# Patient Record
Sex: Male | Born: 1955
Health system: Southern US, Community
[De-identification: ages and names within clinical notes are randomized; demographics above are authoritative.]

## PROBLEM LIST (undated history)

## (undated) DIAGNOSIS — K219 Gastro-esophageal reflux disease without esophagitis: Secondary | ICD-10-CM

## (undated) DIAGNOSIS — B1920 Unspecified viral hepatitis C without hepatic coma: Secondary | ICD-10-CM

## (undated) DIAGNOSIS — Z951 Presence of aortocoronary bypass graft: Secondary | ICD-10-CM

## (undated) DIAGNOSIS — Z9889 Other specified postprocedural states: Secondary | ICD-10-CM

## (undated) DIAGNOSIS — IMO0002 Reserved for concepts with insufficient information to code with codable children: Secondary | ICD-10-CM

## (undated) DIAGNOSIS — I9789 Other postprocedural complications and disorders of the circulatory system, not elsewhere classified: Secondary | ICD-10-CM

## (undated) DIAGNOSIS — I2510 Atherosclerotic heart disease of native coronary artery without angina pectoris: Secondary | ICD-10-CM

## (undated) DIAGNOSIS — F1010 Alcohol abuse, uncomplicated: Secondary | ICD-10-CM

## (undated) DIAGNOSIS — T8859XA Other complications of anesthesia, initial encounter: Secondary | ICD-10-CM

## (undated) DIAGNOSIS — I4891 Unspecified atrial fibrillation: Secondary | ICD-10-CM

## (undated) DIAGNOSIS — E785 Hyperlipidemia, unspecified: Secondary | ICD-10-CM

## (undated) DIAGNOSIS — R740 Nonspecific elevation of levels of transaminase and lactic acid dehydrogenase [LDH]: Secondary | ICD-10-CM

## (undated) DIAGNOSIS — B182 Chronic viral hepatitis C: Secondary | ICD-10-CM

## (undated) DIAGNOSIS — J45909 Unspecified asthma, uncomplicated: Secondary | ICD-10-CM

## (undated) DIAGNOSIS — Z8489 Family history of other specified conditions: Secondary | ICD-10-CM

## (undated) DIAGNOSIS — R112 Nausea with vomiting, unspecified: Secondary | ICD-10-CM

## (undated) DIAGNOSIS — T4145XA Adverse effect of unspecified anesthetic, initial encounter: Secondary | ICD-10-CM

## (undated) DIAGNOSIS — I499 Cardiac arrhythmia, unspecified: Secondary | ICD-10-CM

## (undated) DIAGNOSIS — I10 Essential (primary) hypertension: Secondary | ICD-10-CM

## (undated) DIAGNOSIS — M1990 Unspecified osteoarthritis, unspecified site: Secondary | ICD-10-CM

## (undated) HISTORY — PX: OTHER SURGICAL HISTORY: SHX169

## (undated) HISTORY — DX: Alcohol abuse, uncomplicated: F10.10

## (undated) HISTORY — DX: Atherosclerotic heart disease of native coronary artery without angina pectoris: I25.10

## (undated) HISTORY — DX: Hyperlipidemia, unspecified: E78.5

## (undated) HISTORY — DX: Nonspecific elevation of levels of transaminase and lactic acid dehydrogenase (ldh): R74.0

## (undated) HISTORY — DX: Essential (primary) hypertension: I10

## (undated) HISTORY — DX: Chronic viral hepatitis C: B18.2

## (undated) HISTORY — DX: Unspecified atrial fibrillation: I48.91

## (undated) HISTORY — DX: Unspecified viral hepatitis C without hepatic coma: B19.20

## (undated) HISTORY — DX: Presence of aortocoronary bypass graft: Z95.1

## (undated) HISTORY — DX: Other postprocedural complications and disorders of the circulatory system, not elsewhere classified: I97.89

## (undated) HISTORY — DX: Reserved for concepts with insufficient information to code with codable children: IMO0002

## (undated) HISTORY — PX: ORCHIECTOMY: SHX2116

---

## 1999-03-30 ENCOUNTER — Ambulatory Visit (HOSPITAL_BASED_OUTPATIENT_CLINIC_OR_DEPARTMENT_OTHER): Admission: RE | Admit: 1999-03-30 | Discharge: 1999-03-30 | Payer: Self-pay | Admitting: Otolaryngology

## 1999-09-21 ENCOUNTER — Encounter: Payer: Self-pay | Admitting: *Deleted

## 1999-09-21 ENCOUNTER — Encounter: Admission: RE | Admit: 1999-09-21 | Discharge: 1999-09-21 | Payer: Self-pay | Admitting: *Deleted

## 2002-09-03 ENCOUNTER — Encounter: Payer: Self-pay | Admitting: Gastroenterology

## 2002-09-03 ENCOUNTER — Ambulatory Visit (HOSPITAL_COMMUNITY): Admission: RE | Admit: 2002-09-03 | Discharge: 2002-09-03 | Payer: Self-pay | Admitting: Gastroenterology

## 2002-11-07 ENCOUNTER — Emergency Department (HOSPITAL_COMMUNITY): Admission: EM | Admit: 2002-11-07 | Discharge: 2002-11-07 | Payer: Self-pay | Admitting: Emergency Medicine

## 2002-11-07 ENCOUNTER — Encounter: Payer: Self-pay | Admitting: Emergency Medicine

## 2003-06-06 ENCOUNTER — Emergency Department (HOSPITAL_COMMUNITY): Admission: AD | Admit: 2003-06-06 | Discharge: 2003-06-06 | Payer: Self-pay | Admitting: Family Medicine

## 2003-06-25 HISTORY — PX: COLONOSCOPY: SHX174

## 2003-08-19 ENCOUNTER — Ambulatory Visit (HOSPITAL_COMMUNITY): Admission: RE | Admit: 2003-08-19 | Discharge: 2003-08-19 | Payer: Self-pay | Admitting: Gastroenterology

## 2003-08-19 ENCOUNTER — Encounter (INDEPENDENT_AMBULATORY_CARE_PROVIDER_SITE_OTHER): Payer: Self-pay | Admitting: *Deleted

## 2004-04-18 ENCOUNTER — Ambulatory Visit: Payer: Self-pay | Admitting: Internal Medicine

## 2004-04-27 ENCOUNTER — Ambulatory Visit (HOSPITAL_COMMUNITY): Admission: RE | Admit: 2004-04-27 | Discharge: 2004-04-27 | Payer: Self-pay | Admitting: Gastroenterology

## 2004-09-14 ENCOUNTER — Ambulatory Visit: Payer: Self-pay | Admitting: Internal Medicine

## 2004-10-22 ENCOUNTER — Ambulatory Visit: Payer: Self-pay | Admitting: Internal Medicine

## 2004-10-28 ENCOUNTER — Encounter: Payer: Self-pay | Admitting: Internal Medicine

## 2004-11-02 ENCOUNTER — Ambulatory Visit: Payer: Self-pay | Admitting: Internal Medicine

## 2005-04-16 ENCOUNTER — Ambulatory Visit: Payer: Self-pay | Admitting: Internal Medicine

## 2005-04-24 ENCOUNTER — Ambulatory Visit: Payer: Self-pay | Admitting: Internal Medicine

## 2005-05-20 ENCOUNTER — Ambulatory Visit: Payer: Self-pay | Admitting: Internal Medicine

## 2005-09-09 ENCOUNTER — Ambulatory Visit: Payer: Self-pay | Admitting: Internal Medicine

## 2005-09-13 ENCOUNTER — Ambulatory Visit: Payer: Self-pay | Admitting: Internal Medicine

## 2005-10-01 ENCOUNTER — Ambulatory Visit: Payer: Self-pay | Admitting: Internal Medicine

## 2005-10-25 ENCOUNTER — Ambulatory Visit: Payer: Self-pay | Admitting: Gastroenterology

## 2005-11-22 ENCOUNTER — Ambulatory Visit: Payer: Self-pay | Admitting: Gastroenterology

## 2007-01-28 ENCOUNTER — Ambulatory Visit: Payer: Self-pay | Admitting: Internal Medicine

## 2007-01-28 DIAGNOSIS — B182 Chronic viral hepatitis C: Secondary | ICD-10-CM

## 2007-01-28 DIAGNOSIS — M545 Low back pain: Secondary | ICD-10-CM

## 2007-01-28 HISTORY — DX: Chronic viral hepatitis C: B18.2

## 2007-01-28 LAB — CONVERTED CEMR LAB
Cholesterol, target level: 200 mg/dL
HDL goal, serum: 40 mg/dL
LDL Goal: 100 mg/dL

## 2007-02-06 ENCOUNTER — Encounter (INDEPENDENT_AMBULATORY_CARE_PROVIDER_SITE_OTHER): Payer: Self-pay | Admitting: *Deleted

## 2007-02-06 ENCOUNTER — Encounter: Payer: Self-pay | Admitting: Internal Medicine

## 2007-02-06 DIAGNOSIS — R7401 Elevation of levels of liver transaminase levels: Secondary | ICD-10-CM

## 2007-02-06 DIAGNOSIS — R740 Nonspecific elevation of levels of transaminase and lactic acid dehydrogenase [LDH]: Secondary | ICD-10-CM

## 2007-02-06 HISTORY — DX: Elevation of levels of liver transaminase levels: R74.01

## 2007-02-06 LAB — CONVERTED CEMR LAB
CO2: 31 meq/L (ref 19–32)
Cholesterol: 213 mg/dL (ref 0–200)
Creatinine, Ser: 0.8 mg/dL (ref 0.4–1.5)
Direct LDL: 149.9 mg/dL
HDL: 56.1 mg/dL (ref >39.0)
Potassium: 3.9 meq/L (ref 3.5–5.1)
Sodium: 142 meq/L (ref 135–145)
Total Bilirubin: 1.1 mg/dL (ref 0.3–1.2)
Total Protein: 7.7 g/dL (ref 6.0–8.3)

## 2007-02-18 ENCOUNTER — Emergency Department (HOSPITAL_COMMUNITY): Admission: EM | Admit: 2007-02-18 | Discharge: 2007-02-18 | Payer: Self-pay | Admitting: Emergency Medicine

## 2007-02-18 ENCOUNTER — Ambulatory Visit: Payer: Self-pay | Admitting: Gastroenterology

## 2007-02-20 ENCOUNTER — Telehealth (INDEPENDENT_AMBULATORY_CARE_PROVIDER_SITE_OTHER): Payer: Self-pay | Admitting: *Deleted

## 2007-02-25 ENCOUNTER — Ambulatory Visit: Payer: Self-pay | Admitting: Internal Medicine

## 2007-02-26 ENCOUNTER — Telehealth (INDEPENDENT_AMBULATORY_CARE_PROVIDER_SITE_OTHER): Payer: Self-pay | Admitting: *Deleted

## 2007-03-12 ENCOUNTER — Ambulatory Visit: Payer: Self-pay | Admitting: Internal Medicine

## 2007-03-12 DIAGNOSIS — N508 Other specified disorders of male genital organs: Secondary | ICD-10-CM

## 2007-03-12 DIAGNOSIS — T887XXA Unspecified adverse effect of drug or medicament, initial encounter: Secondary | ICD-10-CM

## 2007-03-13 ENCOUNTER — Encounter: Admission: RE | Admit: 2007-03-13 | Discharge: 2007-03-13 | Payer: Self-pay | Admitting: Internal Medicine

## 2007-03-16 ENCOUNTER — Encounter (INDEPENDENT_AMBULATORY_CARE_PROVIDER_SITE_OTHER): Payer: Self-pay | Admitting: *Deleted

## 2007-03-16 ENCOUNTER — Telehealth: Payer: Self-pay | Admitting: Internal Medicine

## 2007-08-17 ENCOUNTER — Telehealth: Payer: Self-pay | Admitting: Internal Medicine

## 2007-08-17 ENCOUNTER — Ambulatory Visit: Payer: Self-pay | Admitting: Internal Medicine

## 2007-08-19 ENCOUNTER — Telehealth (INDEPENDENT_AMBULATORY_CARE_PROVIDER_SITE_OTHER): Payer: Self-pay | Admitting: *Deleted

## 2007-08-20 ENCOUNTER — Ambulatory Visit: Payer: Self-pay | Admitting: Internal Medicine

## 2007-08-20 DIAGNOSIS — I10 Essential (primary) hypertension: Secondary | ICD-10-CM | POA: Insufficient documentation

## 2007-08-20 HISTORY — DX: Essential (primary) hypertension: I10

## 2007-08-26 ENCOUNTER — Ambulatory Visit: Payer: Self-pay | Admitting: Internal Medicine

## 2007-08-28 ENCOUNTER — Encounter (INDEPENDENT_AMBULATORY_CARE_PROVIDER_SITE_OTHER): Payer: Self-pay | Admitting: *Deleted

## 2007-09-04 ENCOUNTER — Ambulatory Visit: Payer: Self-pay | Admitting: Internal Medicine

## 2007-09-12 LAB — CONVERTED CEMR LAB
ALT: 146 units/L — ABNORMAL HIGH (ref 0–53)
AST: 92 units/L — ABNORMAL HIGH (ref 0–37)
Albumin: 4.2 g/dL (ref 3.5–5.2)
Alkaline Phosphatase: 66 units/L (ref 39–117)
BUN: 13 mg/dL (ref 6–23)
Bilirubin, Direct: 0.2 mg/dL (ref 0.0–0.3)
CO2: 32 meq/L (ref 19–32)
Calcium: 9.5 mg/dL (ref 8.4–10.5)
Chloride: 104 meq/L (ref 96–112)
Cholesterol: 196 mg/dL (ref 0–200)
GFR calc non Af Amer: 107 mL/min
Glucose, Bld: 98 mg/dL (ref 70–99)
LDL Cholesterol: 137 mg/dL — ABNORMAL HIGH (ref 0–99)
Potassium: 4.2 meq/L (ref 3.5–5.1)
Total CHOL/HDL Ratio: 4.5

## 2007-09-14 ENCOUNTER — Encounter (INDEPENDENT_AMBULATORY_CARE_PROVIDER_SITE_OTHER): Payer: Self-pay | Admitting: *Deleted

## 2008-01-21 ENCOUNTER — Ambulatory Visit: Payer: Self-pay | Admitting: Gastroenterology

## 2008-05-06 ENCOUNTER — Ambulatory Visit: Payer: Self-pay | Admitting: Internal Medicine

## 2008-05-06 DIAGNOSIS — M546 Pain in thoracic spine: Secondary | ICD-10-CM | POA: Insufficient documentation

## 2008-05-06 LAB — CONVERTED CEMR LAB
Blood in Urine, dipstick: NEGATIVE
Nitrite: NEGATIVE
Urobilinogen, UA: 0.2

## 2008-06-07 ENCOUNTER — Telehealth (INDEPENDENT_AMBULATORY_CARE_PROVIDER_SITE_OTHER): Payer: Self-pay | Admitting: *Deleted

## 2008-07-14 ENCOUNTER — Encounter: Payer: Self-pay | Admitting: Internal Medicine

## 2008-07-14 ENCOUNTER — Ambulatory Visit: Payer: Self-pay | Admitting: Gastroenterology

## 2008-07-22 ENCOUNTER — Ambulatory Visit (HOSPITAL_COMMUNITY): Admission: RE | Admit: 2008-07-22 | Discharge: 2008-07-22 | Payer: Self-pay | Admitting: Gastroenterology

## 2008-07-22 ENCOUNTER — Encounter: Payer: Self-pay | Admitting: Internal Medicine

## 2008-10-03 ENCOUNTER — Telehealth (INDEPENDENT_AMBULATORY_CARE_PROVIDER_SITE_OTHER): Payer: Self-pay | Admitting: *Deleted

## 2008-10-12 ENCOUNTER — Telehealth (INDEPENDENT_AMBULATORY_CARE_PROVIDER_SITE_OTHER): Payer: Self-pay | Admitting: *Deleted

## 2008-10-19 ENCOUNTER — Ambulatory Visit: Payer: Self-pay | Admitting: Internal Medicine

## 2008-10-20 ENCOUNTER — Encounter: Payer: Self-pay | Admitting: Internal Medicine

## 2008-10-24 ENCOUNTER — Encounter (INDEPENDENT_AMBULATORY_CARE_PROVIDER_SITE_OTHER): Payer: Self-pay | Admitting: *Deleted

## 2009-05-25 ENCOUNTER — Telehealth (INDEPENDENT_AMBULATORY_CARE_PROVIDER_SITE_OTHER): Payer: Self-pay | Admitting: *Deleted

## 2009-10-03 ENCOUNTER — Encounter: Payer: Self-pay | Admitting: Internal Medicine

## 2009-11-01 ENCOUNTER — Encounter: Payer: Self-pay | Admitting: Internal Medicine

## 2009-11-22 ENCOUNTER — Ambulatory Visit: Payer: Self-pay | Admitting: Internal Medicine

## 2009-11-22 DIAGNOSIS — IMO0002 Reserved for concepts with insufficient information to code with codable children: Secondary | ICD-10-CM | POA: Insufficient documentation

## 2009-11-22 DIAGNOSIS — E785 Hyperlipidemia, unspecified: Secondary | ICD-10-CM

## 2009-11-22 HISTORY — DX: Reserved for concepts with insufficient information to code with codable children: IMO0002

## 2009-11-22 HISTORY — DX: Hyperlipidemia, unspecified: E78.5

## 2009-11-29 ENCOUNTER — Ambulatory Visit: Payer: Self-pay | Admitting: Internal Medicine

## 2009-12-04 LAB — CONVERTED CEMR LAB
AST: 62 units/L — ABNORMAL HIGH (ref 0–37)
Albumin: 4.3 g/dL (ref 3.5–5.2)
BUN: 14 mg/dL (ref 6–23)
Basophils Relative: 0.7 % (ref 0.0–3.0)
Cholesterol: 231 mg/dL — ABNORMAL HIGH (ref 0–200)
Creatinine, Ser: 0.8 mg/dL (ref 0.4–1.5)
Eosinophils Absolute: 0.4 10*3/uL (ref 0.0–0.7)
Eosinophils Relative: 8.1 % — ABNORMAL HIGH (ref 0.0–5.0)
GFR calc non Af Amer: 111.04 mL/min (ref >60)
Glucose, Bld: 108 mg/dL — ABNORMAL HIGH (ref 70–99)
HCT: 46.4 % (ref 39.0–52.0)
Hemoglobin: 16.3 g/dL (ref 13.0–17.0)
Lymphs Abs: 1.2 10*3/uL (ref 0.7–4.0)
MCHC: 35.2 g/dL (ref 30.0–36.0)
MCV: 96.2 fL (ref 78.0–100.0)
Monocytes Absolute: 0.4 10*3/uL (ref 0.1–1.0)
Neutro Abs: 2.7 10*3/uL (ref 1.4–7.7)
RBC: 4.82 M/uL (ref 4.22–5.81)
Total Bilirubin: 1 mg/dL (ref 0.3–1.2)
Total CHOL/HDL Ratio: 4
Triglycerides: 76 mg/dL (ref 0.0–149.0)
VLDL: 15.2 mg/dL (ref 0.0–40.0)
WBC: 4.8 10*3/uL (ref 4.5–10.5)

## 2009-12-05 LAB — CONVERTED CEMR LAB: Hgb A1c MFr Bld: 5.4 % (ref 4.6–6.5)

## 2010-01-02 ENCOUNTER — Ambulatory Visit: Payer: Self-pay | Admitting: Internal Medicine

## 2010-01-02 DIAGNOSIS — IMO0002 Reserved for concepts with insufficient information to code with codable children: Secondary | ICD-10-CM | POA: Insufficient documentation

## 2010-01-02 HISTORY — DX: Reserved for concepts with insufficient information to code with codable children: IMO0002

## 2010-01-03 LAB — CONVERTED CEMR LAB
Basophils Absolute: 0 10*3/uL (ref 0.0–0.1)
CO2: 32 meq/L (ref 19–32)
Calcium: 9.1 mg/dL (ref 8.4–10.5)
Creatinine, Ser: 0.8 mg/dL (ref 0.4–1.5)
Eosinophils Absolute: 0.5 10*3/uL (ref 0.0–0.7)
GFR calc non Af Amer: 100.4 mL/min (ref >60)
HCT: 47.9 % (ref 39.0–52.0)
Hemoglobin: 16.5 g/dL (ref 13.0–17.0)
Lymphs Abs: 2.8 10*3/uL (ref 0.7–4.0)
MCHC: 34.4 g/dL (ref 30.0–36.0)
MCV: 98.5 fL (ref 78.0–100.0)
Monocytes Absolute: 0.9 10*3/uL (ref 0.1–1.0)
Neutro Abs: 5.1 10*3/uL (ref 1.4–7.7)
Platelets: 190 10*3/uL (ref 150.0–400.0)
RDW: 12.9 % (ref 11.5–14.6)
Sodium: 142 meq/L (ref 135–145)

## 2010-01-04 ENCOUNTER — Encounter: Payer: Self-pay | Admitting: Internal Medicine

## 2010-01-05 ENCOUNTER — Encounter: Payer: Self-pay | Admitting: Internal Medicine

## 2010-01-12 ENCOUNTER — Encounter: Admission: RE | Admit: 2010-01-12 | Discharge: 2010-01-12 | Payer: Self-pay | Admitting: Neurological Surgery

## 2010-01-17 ENCOUNTER — Encounter: Payer: Self-pay | Admitting: Internal Medicine

## 2010-01-22 ENCOUNTER — Encounter: Admission: RE | Admit: 2010-01-22 | Discharge: 2010-01-22 | Payer: Self-pay | Admitting: Neurological Surgery

## 2010-02-02 ENCOUNTER — Ambulatory Visit (HOSPITAL_BASED_OUTPATIENT_CLINIC_OR_DEPARTMENT_OTHER): Admission: RE | Admit: 2010-02-02 | Discharge: 2010-02-02 | Payer: Self-pay | Admitting: Urology

## 2010-02-09 ENCOUNTER — Encounter: Payer: Self-pay | Admitting: Internal Medicine

## 2010-02-14 ENCOUNTER — Encounter: Payer: Self-pay | Admitting: Internal Medicine

## 2010-02-21 ENCOUNTER — Encounter: Payer: Self-pay | Admitting: Internal Medicine

## 2010-07-18 ENCOUNTER — Encounter: Payer: Self-pay | Admitting: Internal Medicine

## 2010-07-18 ENCOUNTER — Ambulatory Visit
Admission: RE | Admit: 2010-07-18 | Discharge: 2010-07-18 | Payer: Self-pay | Source: Home / Self Care | Attending: Internal Medicine | Admitting: Internal Medicine

## 2010-07-18 DIAGNOSIS — R079 Chest pain, unspecified: Secondary | ICD-10-CM | POA: Insufficient documentation

## 2010-07-20 ENCOUNTER — Ambulatory Visit
Admission: RE | Admit: 2010-07-20 | Discharge: 2010-07-20 | Payer: Self-pay | Source: Home / Self Care | Attending: Internal Medicine | Admitting: Internal Medicine

## 2010-07-20 ENCOUNTER — Encounter: Payer: Self-pay | Admitting: Internal Medicine

## 2010-07-25 ENCOUNTER — Encounter: Payer: Self-pay | Admitting: Physician Assistant

## 2010-07-25 ENCOUNTER — Encounter (INDEPENDENT_AMBULATORY_CARE_PROVIDER_SITE_OTHER): Payer: BC Managed Care – PPO | Admitting: Physician Assistant

## 2010-07-25 DIAGNOSIS — R079 Chest pain, unspecified: Secondary | ICD-10-CM

## 2010-07-26 NOTE — Letter (Signed)
Summary: [facility]  [facility]   Imported By: Lanelle Bal 01/31/2010 08:18:41  _____________________________________________________________________  External Attachment:    Type:   Image     Comment:   External Document

## 2010-07-26 NOTE — Letter (Signed)
Summary: [facility]  [facility]   Imported By: Lanelle Bal 11/08/2009 10:03:55  _____________________________________________________________________  External Attachment:    Type:   Image     Comment:   External Document

## 2010-07-26 NOTE — Assessment & Plan Note (Signed)
Summary: med refill/cbs   Vital Signs:  Patient profile:   55 year old male Height:      69 inches Weight:      186.0 pounds BMI:     27.57 Temp:     98.1 degrees F oral Pulse rate:   72 / minute Resp:     16 per minute BP sitting:   126 / 82  (left arm) Cuff size:   large  Vitals Entered By: Shonna Chock (November 22, 2009 1:55 PM) CC: CPX and refill meds , Hypertension Management, Lipid Management Comments REVIEWED MED LIST, PATIENT AGREED DOSE AND INSTRUCTION CORRECT    CC:  CPX and refill meds , Hypertension Management, and Lipid Management.  History of Present Illness: Mr. Minix is here for med refills ; he is going to [place] for 2 weeks & wants 90 day supplies.Tetanus given today. No diet but decreased portions & meat.  CVE as walking, BB & biking  5 hrs/ week w/o symptoms.  NMR reviewed & risks discussed.  Hypertension History:      He complains of neurologic problems, but denies headache, chest pain, palpitations, dyspnea with exertion, orthopnea, PND, peripheral edema, visual symptoms, syncope, and side effects from treatment.  BP @ home 122/75-140/88.        Positive major cardiovascular risk factors include male age 49 years old or older, hyperlipidemia, hypertension, and family history for ischemic heart disease (males less than 12 years old).  Negative major cardiovascular risk factors include no history of diabetes and non-tobacco-user status.        Further assessment for target organ damage reveals no history of ASHD, stroke/TIA, or peripheral vascular disease.    Lipid Management History:      Positive NCEP/ATP III risk factors include male age 67 years old or older, family history for ischemic heart disease (males less than 25 years old), and hypertension.  Negative NCEP/ATP III risk factors include non-diabetic, non-tobacco-user status, no ASHD (atherosclerotic heart disease), no prior stroke/TIA, no peripheral vascular disease, and no history of aortic aneurysm.       Preventive Screening-Counseling & Management  Alcohol-Tobacco     Smoking Status: quit  Allergies (verified): No Known Drug Allergies  Past History:  Past Medical History: Hepatitis C Hypertension Low back pain, chronic (DDD) GERD, Dr Randa Evens Hyperlipidemia: NMR 2006: LDL 146(2360/1814),TG 69,HDL 49. LDL goal = < 100, ideally < 75. [place] LDL goal = < 130.  Past Surgical History: Lasik surgery; testicular torsion w/o surgery; TM X3,S/P surgery; liver biopsy X ? 2; Colonoscopy 2005: negative  Review of Systems General:  Weight loss 15# with portion control & CVE. Eyes:  Denies blurring, double vision, and vision loss-both eyes. ENT:  Denies difficulty swallowing and hoarseness. CV:  Denies leg cramps with exertion. Resp:  Denies cough, shortness of breath, sputum productive, and wheezing. GI:  Denies abdominal pain, bloody stools, dark tarry stools, and indigestion; PPI controls ERD. GU:  Denies discharge, dysuria, and hematuria; DRE done @ Dr Marcello Fennel 3 weeks ago. Hydrocoelectomy to be resected 02/02/2010.. Derm:  Denies lesion(s) and rash. Neuro:  Complains of tingling; denies brief paralysis, numbness, and weakness; Tingling in LUE positionally; traction has helped in past. Endo:  Denies cold intolerance, excessive hunger, excessive thirst, and heat intolerance.  Physical Exam  General:  well-nourished,in no acute distress; alert,appropriate and cooperative throughout examination Eyes:  No corneal or conjunctival inflammation noted. Perrla. No icterus Neck:  No deformities, masses, or tenderness noted. Pain with L >  R  lateral rotation  Lungs:  Normal respiratory effort, chest expands symmetrically. Lungs are clear to auscultation, no crackles or wheezes. Heart:  regular rhythm, no murmur, no gallop, no rub, no JVD, no HJR, and bradycardia.   Abdomen:  Bowel sounds positive,abdomen soft and non-tender without masses, organomegaly or hernias noted. Genitalia:  Dr  Marcello Fennel Msk:  No deformity or scoliosis noted of thoracic or lumbar spine.   Pulses:  R and L carotid,radial,dorsalis pedis and posterior tibial pulses are full and equal bilaterally Extremities:  No clubbing, cyanosis, edema, or deformity noted with normal full range of motion of all joints.   Neurologic:  alert & oriented X3, strength normal in all extremities, gait normal, and DTRs symmetrical and normal.   Skin:  Intact without suspicious lesions or rashes. No jaundice Cervical Nodes:  No lymphadenopathy noted Axillary Nodes:  No palpable lymphadenopathy Psych:  memory intact for recent and remote, normally interactive, and good eye contact.     Impression & Recommendations:  Problem # 1:  HYPERTENSION, ESSENTIAL NOS (ICD-401.9)  controlled His updated medication list for this problem includes:    Lisinopril 40 Mg Tabs (Lisinopril) .Marland Kitchen... 1 tab daily    Coreg 12.5 Mg Tabs (Carvedilol) .Marland Kitchen... 1 two times a day    Norvasc 10 Mg Tabs (Amlodipine besylate) .Marland Kitchen... 1 by mouth once daily  Orders: EKG w/ Interpretation (93000)  Problem # 2:  HYPERLIPIDEMIA (ICD-272.4)  Problem # 3:  NEURALGIA (ICD-729.2) Positional C5-6 radiculopathy  Problem # 4:  GERD (ICD-530.81) controlled The following medications were removed from the medication list:    Ranitidine Hcl 150 Mg Tabs (Ranitidine hcl) .Marland Kitchen... 1 two times a day pre meals His updated medication list for this problem includes:    Prevacid 30 Mg Cpdr (Lansoprazole) .Marland Kitchen... Take one tablet daily  Problem # 5:  CARRIER, VIRAL HEPATITIS C (ICD-V02.62) as per [facility]  Complete Medication List: 1)  Prevacid 30 Mg Cpdr (Lansoprazole) .... Take one tablet daily 2)  Zovirax 5 % Oint (Acyclovir) 3)  Lisinopril 40 Mg Tabs (Lisinopril) .Marland Kitchen.. 1 tab daily 4)  Coreg 12.5 Mg Tabs (Carvedilol) .Marland Kitchen.. 1 two times a day 5)  Norvasc 10 Mg Tabs (Amlodipine besylate) .Marland Kitchen.. 1 by mouth once daily  Other Orders: Tdap => 62yrs IM (16109) Admin 1st  Vaccine (60454)  Hypertension Assessment/Plan:      The patient's hypertensive risk group is category C: Target organ damage and/or diabetes.  His calculated 10 year risk of coronary heart disease is 11 %.  Today's blood pressure is 126/82.    Lipid Assessment/Plan:      Based on NCEP/ATP III, the patient's risk factor category is "2 or more risk factors and a calculated 10 year CAD risk of < 20%".  The patient's lipid goals are as follows: Total cholesterol goal is 200; LDL cholesterol goal is 100; HDL cholesterol goal is 40; Triglyceride goal is 150.  His LDL cholesterol goal has not been met.  Secondary causes for hyperlipidemia have been ruled out.  He has been counseled on adjunctive measures for lowering his cholesterol and has been provided with dietary instructions.    Patient Instructions: 1)  Please schedule fasting labs; see Diagnoses for Codes: CBC & dif; 2)  BMP; 3)  Hepatic Panel; 4)  Lipid Panel . 5)  Avoid foods high in acid (tomatoes, citrus juices, spicy foods). Avoid eating within two hours of lying down or before exercising. Do not over eat; try smaller more frequent  meals. Elevate head of bed twelve inches when sleeping. Avoid positions which cause neck pain.  Prescriptions: NORVASC 10 MG  TABS (AMLODIPINE BESYLATE) 1 by mouth once daily  #90 x 3   Entered and Authorized by:   Marga Melnick MD   Signed by:   Marga Melnick MD on 11/22/2009   Method used:   Print then Give to Patient   RxID:   [id number] COREG 12.5 MG  TABS (CARVEDILOL) 1 two times a day  #180 x 3   Entered and Authorized by:   Marga Melnick MD   Signed by:   Marga Melnick MD on 11/22/2009   Method used:   Print then Give to Patient   RxID:   [id number] LISINOPRIL 40 MG TABS (LISINOPRIL) 1 tab daily  #90 x 3   Entered and Authorized by:   Marga Melnick MD   Signed by:   Marga Melnick MD on 11/22/2009   Method used:   Print then Give to Patient   RxID:   [id number] PREVACID 30  MG CPDR (LANSOPRAZOLE) take one tablet daily  #90 x 1   Entered and Authorized by:   Marga Melnick MD   Signed by:   Marga Melnick MD on 11/22/2009   Method used:   Print then Give to Patient   RxID:   [id number]    Immunizations Administered:  Tetanus Vaccine:    Vaccine Type: Tdap    Site: right deltoid    Mfr: Gl[employer]oSmithK[employer]    Dose: 0.5 ml    Route: IM    Given by: Chrae Malloy    Exp. Date: 09/16/2011    Lot #: [id number]    VIS given: 05/12/07 version given November 22, 2009.

## 2010-07-26 NOTE — Letter (Signed)
Summary: [facility]  [facility]   Imported By: Lanelle Bal 03/05/2010 13:47:28  _____________________________________________________________________  External Attachment:    Type:   Image     Comment:   External Document

## 2010-07-26 NOTE — Letter (Signed)
Summary: [facility]  [facility]   Imported By: Lennie Odor 01/18/2010 11:43:36  _____________________________________________________________________  External Attachment:    Type:   Image     Comment:   External Document

## 2010-07-26 NOTE — Letter (Signed)
Summary: [facility]  [facility]   Imported By: Lanelle Bal 10/10/2009 08:22:50  _____________________________________________________________________  External Attachment:    Type:   Image     Comment:   External Document

## 2010-07-26 NOTE — Letter (Signed)
Summary: [facility]  [facility]   Imported By: Lanelle Bal 03/06/2010 09:45:33  _____________________________________________________________________  External Attachment:    Type:   Image     Comment:   External Document

## 2010-07-26 NOTE — Letter (Signed)
Summary: [facility]  [facility]   Imported By: Lanelle Bal 03/06/2010 08:59:58  _____________________________________________________________________  External Attachment:    Type:   Image     Comment:   External Document

## 2010-07-26 NOTE — Assessment & Plan Note (Signed)
Summary: STOMACH PROBLEMS/KN   Vital Signs:  Patient profile:   55 year old male Weight:      179.8 pounds Temp:     97.8 degrees F oral Pulse rate:   76 / minute Resp:     16 per minute BP sitting:   110 / 78  (left arm) Cuff size:   large  Vitals Entered By: Shonna Chock CMA (January 02, 2010 11:55 AM) CC: Diarrhea x 4-5 days, nausea/ no vomitting, Back pain Comments REVIEWED MED LIST, PATIENT AGREED DOSE AND INSTRUCTION CORRECT    CC:  Diarrhea x 4-5 days, nausea/ no vomitting, and Back pain.  History of Present Illness:  Diarrhea      This is a 55 year old man who presents with Diarrhea as of 12/28/2009 while in [place].  The patient reports 6-10 stools per day, watery/unformed stools, malodorous stools, fecal urgency, nocturnal diarrhea, fasting diarrhea, gassiness, and abrupt onset of symptoms, but denies voluminous stools, blood in stool, mucus in stool, greasy stools, fecal soiling, alternating diarrhea/constipation, and bloating.  Associated symptoms include lower  abdominal cramps, nausea, lightheadedness, and weight loss of 6#.  The patient denies fever, abdominal pain, vomiting, increased thirst, joint pains, mouth ulcers, and eye redness.  The symptoms are worse with any food or  any liquid.  The symptoms are better with hypomotility agents, Immodium.  Patient's risk factors for diarrhea include international travel.  No PMH of GI disease  Back Pain      The patient also presents with Back pain as of 07/06.  The pain is located in the right SI joint.  The pain began gradually and after overuse, lifting bags & walking on uneven terrain in [place].  The pain radiates to the right leg below the knee.  The pain is made worse by flexion.  The pain is made better by  rest. MRI & steroid injection completed  in [place]. PMH of DDD treated by Dr Rosalie Doctor, 1991.  Allergies (verified): No Known Drug Allergies  Review of Systems General:  Complains of chills; denies sweats. Eyes:   Denies discharge and eye pain. ENT:  Complains of hoarseness; denies difficulty swallowing. GU:  Denies discharge, hematuria, and incontinence. Neuro:  Complains of numbness and tingling; N&T RLE.  Physical Exam  General:  Appears uncomfotable,in no acute distress; alert,appropriate and cooperative throughout examination Eyes:  No corneal or conjunctival inflammation noted.No icterus Mouth:  Oral mucosa and oropharynx without lesions or exudates.  Pharyngeal erythema.  Tongue moist Lungs:  Normal respiratory effort, chest expands symmetrically. Lungs are clear to auscultation, no crackles or wheezes. Heart:  regular rhythm, no murmur, no gallop, no rub, no JVD, no HJR, and bradycardia.   Abdomen:  Bowel sounds positive,abdomen soft and non-tender without masses, organomegaly or hernias noted. Msk:  No LS tenderness to palpation Extremities:  No clubbing, cyanosis, edema, or deformity noted with normal full range of motion of all joints. Pain R LS area with SLR  Neurologic:  alert & oriented X3, strength normal in all extremities, gait abnormal with foot drop on R, and DTRs asymmetrical , 0+ @ R knee Skin:  Intact without suspicious lesions or rashes. No jaundice or tenting Cervical Nodes:  No lymphadenopathy noted Axillary Nodes:  No palpable lymphadenopathy Psych:  memory intact for recent and remote, normally interactive, and good eye contact.     Impression & Recommendations:  Problem # 1:  DIARRHEA (ICD-787.91)  In context of foreign travel  Orders: Venipuncture (10932) TLB-CBC  Platelet - w/Differential (85025-CBCD) TLB-BMP (Basic Metabolic Panel-BMET) (80048-METABOL) T-Culture, Stool (87045/87046-70140) T-Culture, C-Diff Toxin A/B (57846-96295) T-Stool Giardia / Crypto- EIA (28413) T-Stool for O&P (24401-02725)  Problem # 2:  LUMBAR RADICULOPATHY, RIGHT (ICD-724.4)  L4-5  Orders: Neurosurgeon Referral (Neurosurgeon)  His updated medication list for this problem  includes:    Oxycodone Hcl 10 Mg Tabs (Oxycodone hcl) .Marland Kitchen... 1 every4- 6 hrs as needed  Complete Medication List: 1)  Prevacid 30 Mg Cpdr (Lansoprazole) .... Take one tablet daily 2)  Zovirax 5 % Oint (Acyclovir) 3)  Lisinopril 40 Mg Tabs (Lisinopril) .Marland Kitchen.. 1 tab daily 4)  Coreg 12.5 Mg Tabs (Carvedilol) .Marland Kitchen.. 1 two times a day 5)  Norvasc 10 Mg Tabs (Amlodipine besylate) .Marland Kitchen.. 1 by mouth once daily 6)  Oxycodone Hcl 10 Mg Tabs (Oxycodone hcl) .Marland Kitchen.. 1 every4- 6 hrs as needed 7)  Doxycycline Hyclate 100 Mg Caps (Doxycycline hyclate) .Marland Kitchen.. 1 two times a day ; avoid sun  Patient Instructions: 1)  Drink clear liquids only for the next 24 hours, then slowly add other liquids and food as you  tolerate them. Prescriptions: DOXYCYCLINE HYCLATE 100 MG CAPS (DOXYCYCLINE HYCLATE) 1 two times a day ; avoid sun  #14 x 0   Entered and Authorized by:   Marga Melnick MD   Signed by:   Marga Melnick MD on 01/02/2010   Method used:   Print then Give to Patient   RxID:   [id number] OXYCODONE HCL 10 MG TABS (OXYCODONE HCL) 1 every4- 6 hrs as needed  #30 x 0   Entered and Authorized by:   Marga Melnick MD   Signed by:   Marga Melnick MD on 01/02/2010   Method used:   Print then Give to Patient   RxID:   [id number]

## 2010-07-26 NOTE — Assessment & Plan Note (Signed)
Summary: shortness of breath when walking x 1 month/no chest pain/kn   Vital Signs:  Patient profile:   55 year old male Weight:      194.8 pounds BMI:     28.87 O2 Sat:      97 % on Room air Temp:     98.3 degrees F oral Pulse rate:   78 / minute Resp:     15 per minute BP sitting:   128 / 88  (left arm) Cuff size:   large  Vitals Entered By: Shonna Chock CMA (July 18, 2010 10:36 AM)  O2 Flow:  Room air CC: SOB and chest tightness when walking   CC:  SOB and chest tightness when walking.  History of Present Illness:     This is a 55 year old male who presents with  exertional chest "tightness" for 1 month . The patient reports exertional chest pain and shortness of breath, but denies resting chest pain, nausea, vomiting, diaphoresis, palpitations, dizziness, light headedness,  indigestion (on Prevacid), reflux or dysphagia.  The pain is described as pressure-like &  is located in the left & right  upper chest .The pain does not radiate.  Episodes of chest pain last < 20 seconds.  The pain is brought on or made worse by any  walking , even on level ground.  The pain is relieved or improved with rest.   PMH of asthma as child . PMH of dysliidemia. FH of HTN , MI ( MGF @ 55 , MGM @ 50).  Current Medications (verified): 1)  Prevacid 30 Mg Cpdr (Lansoprazole) .... Take One Tablet Daily 2)  Zovirax 5 % Oint (Acyclovir) .... As Directed 3)  Lisinopril 40 Mg Tabs (Lisinopril) .Marland Kitchen.. 1 Tab Daily 4)  Coreg 12.5 Mg  Tabs (Carvedilol) .Marland Kitchen.. 1 Two Times A Day 5)  Norvasc 10 Mg  Tabs (Amlodipine Besylate) .Marland Kitchen.. 1 By Mouth Once Daily  Allergies (verified): No Known Drug Allergies  Family History: Father: unknown health history Mother: d lung  cancer Siblings:sister IBS,htn  MGF:  MI @ 74; MGM : MI @ 31  Social History: low salt Occupation:Sales Single Former Smoker:  quit 2004 Alcohol use-yes: socially Regular exercise-yes:3-4X/week w/o symptoms  Physical Exam  General:   well-nourished,in no acute distress; alert,appropriate and cooperative throughout examination Eyes:  No corneal or conjunctival inflammation noted. No icterus Mouth:  Oral mucosa and oropharynx without lesions or exudates.  No pharyngeal erythema.   Lungs:  Normal respiratory effort, chest expands symmetrically. Lungs are clear to auscultation, no crackles or wheezes. Heart:  Normal rate and regular rhythm. S1 and S2 normal without gallop, murmur, click, rub.S4 Abdomen:  Bowel sounds positive,abdomen soft and non-tender without masses, organomegaly or hernias noted. Pulses:  R and L carotid,radial,dorsalis pedis and posterior tibial pulses are full and equal bilaterally Extremities:  No clubbing, cyanosis, edema. Neg Homan's Neurologic:  alert & oriented X3.   Skin:  Intact without suspicious lesions or rashes. No jaundice Cervical Nodes:  No lymphadenopathy noted Axillary Nodes:  No palpable lymphadenopathy Psych:  memory intact for recent and remote, normally interactive, and good eye contact.     Impression & Recommendations:  Problem # 1:  CHEST PAIN (ICD-786.50)  Orders: EKG w/ Interpretation (93000) Misc. Referral (Misc. Ref)  Problem # 2:  HYPERLIPIDEMIA (ICD-272.4)  Problem # 3:  CARRIER, VIRAL HEPATITIS C (ICD-V02.62)  Problem # 4:  ELEVATION, TRANSAMINASE/LDH LEVELS (ICD-790.4) PMH of  Complete Medication List: 1)  Prevacid 30 Mg  Cpdr (Lansoprazole) .... Take one tablet daily 2)  Zovirax 5 % Oint (Acyclovir) .... As directed 3)  Lisinopril 40 Mg Tabs (Lisinopril) .Marland Kitchen.. 1 tab daily 4)  Coreg 12.5 Mg Tabs (Carvedilol) .Marland Kitchen.. 1 two times a day 5)  Norvasc 10 Mg Tabs (Amlodipine besylate) .Marland Kitchen.. 1 by mouth once daily  Patient Instructions: 1)  Please schedule a fasting [facility] panel(1304X); Codes: 786.50, 272.4, V17.3. This will optimally  assess  cardiovascular risk.   Orders Added: 1)  Est. Patient Level IV [21308] 2)  EKG w/ Interpretation [93000] 3)  Misc.  Referral [Misc. Ref]

## 2010-07-26 NOTE — Miscellaneous (Signed)
Summary: Orders Update  Clinical Lists Changes  Orders: Added new Referral order of Cardiology Referral (Cardiology) - Signed  Appended Document: Orders Update    Clinical Lists Changes  Orders: Added new Referral order of Cardiology Referral (Cardiology) - Signed

## 2010-08-31 ENCOUNTER — Encounter: Payer: Self-pay | Admitting: Internal Medicine

## 2010-09-07 LAB — POCT I-STAT 4, (NA,K, GLUC, HGB,HCT)
Hemoglobin: 15.3 g/dL (ref 13.0–17.0)
Potassium: 4.3 mEq/L (ref 3.5–5.1)

## 2010-09-11 ENCOUNTER — Other Ambulatory Visit: Payer: Self-pay | Admitting: Internal Medicine

## 2010-11-09 NOTE — Op Note (Signed)
NAME:  TRAVER, MECKES NO.:  [id number]   MEDICAL RECORD NO.:  [id number]          PATIENT TYPE:  AMB   LOCATION:  ENDO                         FACILITY:  [facility]   PHYSICIAN:  James L. Malon Kindle., M.D.DATE OF BIRTH:  21-Apr-1956   DATE OF PROCEDURE:  04/27/2004  DATE OF DISCHARGE:                                 OPERATIVE REPORT   PROCEDURE:  Colonoscopy.   ENDOSCOPIST:  Llana Aliment. Randa Evens, M.D.   ANESTHESIA:  Fentanyl 60 mcg, Versed 7 mg IV.   SCOPE:  Olympus pediatric colonoscope.   INDICATIONS FOR PROCEDURE:  Patient with history of hepatitis C with no  previous screening colonoscopy.   DESCRIPTION OF PROCEDURE:  The procedure had been explained to the patient  and consent obtained. With the patient in the left lateral decubitus  position, the Olympus scope was inserted and advanced.  The prep was  excellent.  We were able to advance easily to the cecum, ileocecal valve and  appendiceal orifice were seen.  Scope was withdrawn. The cecum,  ascending  colon,  transverse colon, splenic flexure, descending and sigmoid colon were  seen well. No polyps or other lesions were seen. The scope was withdrawn.  The patient tolerated the procedure well.   ASSESSMENT:  Normal screening colonoscopy, V76.51.   PLAN:  Yearly Hemoccults.  Will recommend screening procedure in the future  for specific indications, such as heme-positive stools, etc.       JLE/MEDQ  D:  04/27/2004  T:  04/28/2004  Job:  [id number]   cc:   Titus Dubin. Alwyn Ren, M.D. [facility]

## 2010-11-15 ENCOUNTER — Other Ambulatory Visit: Payer: Self-pay | Admitting: Internal Medicine

## 2010-11-21 ENCOUNTER — Other Ambulatory Visit: Payer: Self-pay | Admitting: Internal Medicine

## 2011-01-11 ENCOUNTER — Other Ambulatory Visit: Payer: Self-pay | Admitting: Internal Medicine

## 2011-01-25 ENCOUNTER — Other Ambulatory Visit: Payer: Self-pay | Admitting: Neurological Surgery

## 2011-01-25 DIAGNOSIS — M5126 Other intervertebral disc displacement, lumbar region: Secondary | ICD-10-CM

## 2011-02-05 ENCOUNTER — Ambulatory Visit
Admission: RE | Admit: 2011-02-05 | Discharge: 2011-02-05 | Disposition: A | Discharge status: Home / Self Care | Payer: BC Managed Care – PPO | Source: Ambulatory Visit | Attending: Neurological Surgery | Admitting: Neurological Surgery

## 2011-02-05 ENCOUNTER — Other Ambulatory Visit: Payer: BC Managed Care – PPO

## 2011-02-05 DIAGNOSIS — M546 Pain in thoracic spine: Secondary | ICD-10-CM

## 2011-02-05 DIAGNOSIS — IMO0002 Reserved for concepts with insufficient information to code with codable children: Secondary | ICD-10-CM

## 2011-02-05 DIAGNOSIS — M5126 Other intervertebral disc displacement, lumbar region: Secondary | ICD-10-CM

## 2011-02-05 DIAGNOSIS — M545 Low back pain: Secondary | ICD-10-CM

## 2011-02-05 MED ORDER — DIAZEPAM 2 MG PO TABS
10.0000 mg | ORAL_TABLET | Freq: Once | ORAL | Status: AC
  Administered 2011-02-05: 10 mg via ORAL

## 2011-02-05 MED ORDER — OXYCODONE-ACETAMINOPHEN 5-325 MG PO TABS
2.0000 | ORAL_TABLET | Freq: Once | ORAL | Status: AC
  Administered 2011-02-05: 2 via ORAL

## 2011-02-05 MED ORDER — IOHEXOL 180 MG/ML  SOLN
15.0000 mL | Freq: Once | INTRAMUSCULAR | Status: AC | PRN
  Administered 2011-02-05: 15 mL via INTRATHECAL

## 2011-02-05 NOTE — Progress Notes (Signed)
Pt resting quietly w/o complaint.  Denies pain at present.

## 2011-04-04 ENCOUNTER — Inpatient Hospital Stay (HOSPITAL_COMMUNITY)
Admission: RE | Admit: 2011-04-04 | Payer: BC Managed Care – PPO | Source: Ambulatory Visit | Admitting: Neurological Surgery

## 2011-04-05 LAB — I-STAT 8, (EC8 V) (CONVERTED LAB)
Bicarbonate: 31.4 — ABNORMAL HIGH
Glucose, Bld: 92
Sodium: 139
TCO2: 33
pCO2, Ven: 55.7 — ABNORMAL HIGH
pH, Ven: 7.359 — ABNORMAL HIGH

## 2011-04-05 LAB — POCT I-STAT CREATININE: Operator id: 279831

## 2011-04-06 ENCOUNTER — Other Ambulatory Visit: Payer: Self-pay | Admitting: Internal Medicine

## 2011-08-07 ENCOUNTER — Other Ambulatory Visit: Payer: Self-pay | Admitting: Internal Medicine

## 2011-08-08 ENCOUNTER — Telehealth: Payer: Self-pay | Admitting: Internal Medicine

## 2011-08-08 MED ORDER — AMLODIPINE BESYLATE 10 MG PO TABS: 10.0000 mg | ORAL_TABLET | Freq: Every day | ORAL | Status: DC

## 2011-08-08 NOTE — Telephone Encounter (Signed)
rx sent in electronically 

## 2011-08-08 NOTE — Telephone Encounter (Signed)
Refill: Amlodipine Besylate 10 mg tab. Take 1 tablet by mouth every day. Qty 90. Last fill 05-11-11

## 2011-08-15 ENCOUNTER — Ambulatory Visit (INDEPENDENT_AMBULATORY_CARE_PROVIDER_SITE_OTHER): Payer: BC Managed Care – PPO | Admitting: Internal Medicine

## 2011-08-15 ENCOUNTER — Ambulatory Visit (INDEPENDENT_AMBULATORY_CARE_PROVIDER_SITE_OTHER)
Admission: RE | Admit: 2011-08-15 | Discharge: 2011-08-15 | Disposition: A | Discharge status: Home / Self Care | Payer: BC Managed Care – PPO | Source: Ambulatory Visit | Attending: Internal Medicine | Admitting: Internal Medicine

## 2011-08-15 ENCOUNTER — Telehealth: Payer: Self-pay | Admitting: *Deleted

## 2011-08-15 DIAGNOSIS — I10 Essential (primary) hypertension: Secondary | ICD-10-CM

## 2011-08-15 DIAGNOSIS — R079 Chest pain, unspecified: Secondary | ICD-10-CM

## 2011-08-15 MED ORDER — CARVEDILOL 25 MG PO TABS: 25.0000 mg | ORAL_TABLET | Freq: Two times a day (BID) | ORAL | Status: DC

## 2011-08-15 MED ORDER — NITROGLYCERIN 0.4 MG SL SUBL: 0.4000 mg | SUBLINGUAL_TABLET | SUBLINGUAL | Status: DC | PRN

## 2011-08-15 NOTE — Patient Instructions (Addendum)
Get the XR done Came back fasting:  FLP CMP CBC --- dx Chest pain A1C-- dx hyperglycemia If CP: nitroglycerine x 3, if pain severe or persistent : go to the ER See Dr Alwyn Ren in 2 months for a physical exam

## 2011-08-15 NOTE — Assessment & Plan Note (Addendum)
56 year old gentleman with a history of hypertension on mildly elevated cholesterol who presents with atypical chest pain. This started about 6 weeks ago but he has complained about chest pain in the past. Chart is reviewed, normal GB per abdominal ultrasound in 2010 Exercise stress test negative in February 2012 except for elevated blood pressure. Not much to think about GERD, gallbladder dyskinesia, PE EKG today w/o acute changes  Plan: Aspirin daily, Chest x-ray, labs including hemoglobin A1c Cardiology referral, I think it is worth to repeat stress test with nuclear images. Adjust BP meds ER if symptoms severe

## 2011-08-15 NOTE — Progress Notes (Signed)
  Subjective:    Patient ID: Manuel Bell, male    DOB: 1956/02/09, 56 y.o.   MRN: [id number]  HPI Acute cvisit 6 weeks history of on and off chest pain. The pain is at the upper and bilateral aspect of the chest, described as tightness, in a scale from 0-10 it is a #3. It may last 5-15 minutes, usually better after he takes an aspirin. It may be associated to exertion or at rest, it is not related to food intake. There is no radiation, sweats nausea or presyncope. A couple of times, he checked his blood pressure while having chest pain and his BP was noted to be elevated.  Past Medical History: Hepatitis C Hypertension hyperlipidemia Low back pain, chronic (DDD) GERD, Dr Randa Evens Past Surgical History: Lasik surgery testicular torsion , left orchiectomy L eraTM X3 liver biopsy X ? 2 Colonoscopy 2005: neg Family History: Father: unknown health history Mother: d lung CA Colon ca-- n0 prostate ca--no IBS-- sister  HTN--siblings  DM--no Stroke--no MI-- GF  @ 29 Social History: Single, no children Former Smoker: 2004 Alcohol use-yes: daily, varies from 1 beer to a bottle of wine   Review of Systems No recent fever or chills GERD symptoms well-controlled with PPIs No cough  or shortness or breath, no wheezing No abdominal pain . No recent airplane trips, calf pain or swelling    Objective:   Physical Exam  Constitutional: He is oriented to person, place, and time. He appears well-developed and well-nourished. No distress.  Cardiovascular: Normal rate, regular rhythm and normal heart sounds.   No murmur heard. Pulmonary/Chest: Effort normal and breath sounds normal. No respiratory distress. He has no wheezes. He has no rales.  Abdominal: Soft. Bowel sounds are normal. He exhibits no distension. There is no tenderness. There is no rebound and no guarding.  Musculoskeletal: He exhibits no edema.  Neurological: He is alert and oriented to person, place, and time.  Skin:  He is not diaphoretic.  Psychiatric: He has a normal mood and affect. His behavior is normal. Judgment and thought content normal.      Assessment & Plan:

## 2011-08-15 NOTE — Telephone Encounter (Signed)
Triage Call Report Triage Record Num: [id number] Operator: Jeraldine Loots Patient Name: Manuel Bell Call Date & Time: 08/15/2011 9:15:32AM Patient Phone: [phone] PCP: Marga Melnick Patient Gender: Male PCP Fax : [phone] Patient DOB: [birth year] Practice Name: [facility] Reason for Call: Caller: [name]; PCP: Marga Melnick; CB#: [phone]; Call regarding chest pain/chest discomfort. Started about a month ago, noticed same when walking or getting out of the shower, any activity. First was only occuring 1-2 times daily, now is multiple times daily. States that he will take a baby asa and the pain resolves. Last episode was this am around 6a after his shower. Needs to be seen, scheduled today with Dr. Drue Novel at 10a. Protocol(s) Used: Chest Pain Recommended Outcome per Protocol: See Provider within 24 hours Reason for Outcome: One or more occurrences of unexplained pain in shoulders, neck, jaw, in one or both arms, stomach or back lasting more than a few minutes that has not been evaluated by a healthcare provider and has risk factors for cardiovascular disease. Care Advice: ~

## 2011-08-15 NOTE — Telephone Encounter (Signed)
Noted, patient seen Dr.Paz

## 2011-08-15 NOTE — Assessment & Plan Note (Signed)
Reports that his BP sometimes is elevated with chest pain, at his stress test last year his BP was noted to be elevated. Increase Coreg dose.

## 2011-08-16 ENCOUNTER — Other Ambulatory Visit: Payer: Self-pay | Admitting: Internal Medicine

## 2011-08-16 ENCOUNTER — Encounter: Payer: Self-pay | Admitting: Internal Medicine

## 2011-08-16 NOTE — Telephone Encounter (Signed)
Spoke with patient, patient stated he requested this in error. Patient already picked up rx from [facility]  I called [facility] to have them void request

## 2011-08-23 ENCOUNTER — Other Ambulatory Visit: Payer: Self-pay | Admitting: Internal Medicine

## 2011-08-23 DIAGNOSIS — Z951 Presence of aortocoronary bypass graft: Secondary | ICD-10-CM

## 2011-08-23 DIAGNOSIS — I4891 Unspecified atrial fibrillation: Secondary | ICD-10-CM

## 2011-08-23 DIAGNOSIS — I9789 Other postprocedural complications and disorders of the circulatory system, not elsewhere classified: Secondary | ICD-10-CM

## 2011-08-23 DIAGNOSIS — R739 Hyperglycemia, unspecified: Secondary | ICD-10-CM

## 2011-08-23 HISTORY — DX: Presence of aortocoronary bypass graft: Z95.1

## 2011-08-23 HISTORY — DX: Unspecified atrial fibrillation: I48.91

## 2011-08-27 ENCOUNTER — Other Ambulatory Visit (INDEPENDENT_AMBULATORY_CARE_PROVIDER_SITE_OTHER): Payer: BC Managed Care – PPO

## 2011-08-27 DIAGNOSIS — R079 Chest pain, unspecified: Secondary | ICD-10-CM

## 2011-08-27 DIAGNOSIS — R739 Hyperglycemia, unspecified: Secondary | ICD-10-CM

## 2011-08-27 DIAGNOSIS — R7309 Other abnormal glucose: Secondary | ICD-10-CM

## 2011-08-27 LAB — CBC WITH DIFFERENTIAL/PLATELET
Eosinophils Relative: 9.8 % — ABNORMAL HIGH (ref 0.0–5.0)
HCT: 46.2 % (ref 39.0–52.0)
Hemoglobin: 15.8 g/dL (ref 13.0–17.0)
Lymphs Abs: 1.5 10*3/uL (ref 0.7–4.0)
MCV: 99 fl (ref 78.0–100.0)
Monocytes Absolute: 0.4 10*3/uL (ref 0.1–1.0)
Monocytes Relative: 7.5 % (ref 3.0–12.0)
Neutro Abs: 2.4 10*3/uL (ref 1.4–7.7)
Platelets: 141 10*3/uL — ABNORMAL LOW (ref 150.0–400.0)
RDW: 13.2 % (ref 11.5–14.6)

## 2011-08-27 LAB — COMPREHENSIVE METABOLIC PANEL
ALT: 154 U/L — ABNORMAL HIGH (ref 0–53)
Alkaline Phosphatase: 66 U/L (ref 39–117)
Creatinine, Ser: 0.7 mg/dL (ref 0.4–1.5)
Sodium: 135 mEq/L (ref 135–145)
Total Bilirubin: 0.5 mg/dL (ref 0.3–1.2)
Total Protein: 7.6 g/dL (ref 6.0–8.3)

## 2011-08-27 LAB — LIPID PANEL
HDL: 57.1 mg/dL (ref >39.00)
LDL Cholesterol: 102 mg/dL — ABNORMAL HIGH (ref 0–99)
Total CHOL/HDL Ratio: 3
Triglycerides: 85 mg/dL (ref 0.0–149.0)
VLDL: 17 mg/dL (ref 0.0–40.0)

## 2011-08-27 LAB — HEMOGLOBIN A1C: Hgb A1c MFr Bld: 5.5 % (ref 4.6–6.5)

## 2011-08-27 NOTE — Progress Notes (Signed)
LABS ONLY  

## 2011-09-10 ENCOUNTER — Encounter: Payer: Self-pay | Admitting: Cardiology

## 2011-09-10 ENCOUNTER — Encounter: Payer: Self-pay | Admitting: *Deleted

## 2011-09-10 ENCOUNTER — Ambulatory Visit (INDEPENDENT_AMBULATORY_CARE_PROVIDER_SITE_OTHER): Payer: BC Managed Care – PPO | Admitting: Cardiology

## 2011-09-10 DIAGNOSIS — I10 Essential (primary) hypertension: Secondary | ICD-10-CM

## 2011-09-10 DIAGNOSIS — R079 Chest pain, unspecified: Secondary | ICD-10-CM

## 2011-09-10 DIAGNOSIS — E785 Hyperlipidemia, unspecified: Secondary | ICD-10-CM

## 2011-09-10 DIAGNOSIS — Z7289 Other problems related to lifestyle: Secondary | ICD-10-CM

## 2011-09-10 LAB — APTT: aPTT: 30.6 s — ABNORMAL HIGH (ref 21.7–28.8)

## 2011-09-10 MED ORDER — METOPROLOL TARTRATE 25 MG PO TABS: 25.0000 mg | ORAL_TABLET | Freq: Two times a day (BID) | ORAL | Status: DC

## 2011-09-10 NOTE — Assessment & Plan Note (Signed)
His pain is very worrisome for unstable angina. I will start him on aspirin, beta blockers. He has sublingual nitroglycerin. He will be scheduled for cardiac catheterization tomorrow and the [facility] lab. He should present with any unstable symptoms by calling [phone] if these occur before that.

## 2011-09-10 NOTE — Assessment & Plan Note (Signed)
It is noted that he has a daily alcohol use history. He will continue to be educated.

## 2011-09-10 NOTE — Assessment & Plan Note (Signed)
His last LDL was 103 this month.  For now he will continue the meds as listed.

## 2011-09-10 NOTE — Patient Instructions (Signed)
Your physician has requested that you have a cardiac catheterization. Cardiac catheterization is used to diagnose and/or treat various heart conditions. Doctors may recommend this procedure for a number of different reasons. The most common reason is to evaluate chest pain. Chest pain can be a symptom of coronary artery disease (CAD), and cardiac catheterization can show whether plaque is narrowing or blocking your heart's arteries. This procedure is also used to evaluate the valves, as well as measure the blood flow and oxygen levels in different parts of your heart. For further information please visit [email or web address] Please follow instruction sheet, as given.  LABS TODAY:  PT/INR, PTT

## 2011-09-10 NOTE — Progress Notes (Signed)
 HPI The patient presents for evaluation of chest discomfort. This has been happening probably over 3 months. However, it is been getting progressively worse with increasing frequency and severity. He's now getting chest discomfort walking 20 feet on level ground. He describes a tightness in his mid chest. Last night it was 9/10 in intensity. It may last for 10 minutes at a time. He has no radiation to his jaw or arms. He has a mild sensation of breathlessness but no frank shortness of breath, PND or orthopnea. He's not describing nausea or vomiting with this. He's had no diaphoresis. He has no palpitations, presyncope or syncope. He has not been active recently because of this.  He has never had this sensation previously.  No Known Allergies  Current Outpatient Prescriptions  Medication Sig Dispense Refill  . amLODipine (NORVASC) 10 MG tablet Take 1 tablet (10 mg total) by mouth daily.  90 tablet  0  . aspirin 81 MG tablet Take 81 mg by mouth daily.      . carvedilol (COREG) 25 MG tablet Take 1 tablet (25 mg total) by mouth 2 (two) times daily with a meal.  60 tablet  3  . lansoprazole (PREVACID) 30 MG capsule TAKE ONE CAPSULE BY MOUTH EVERY DAY  90 capsule  1  . lisinopril (PRINIVIL,ZESTRIL) 40 MG tablet TAKE ONE TABLET BY MOUTH EVERY DAY  90 tablet  0  . nitroGLYCERIN (NITROSTAT) 0.4 MG SL tablet Place 1 tablet (0.4 mg total) under the tongue every 5 (five) minutes as needed for chest pain (no more than 3 tabs a day).  20 tablet  0    Past Medical History  Diagnosis Date  . HTN (hypertension)   . Hep C w/o coma, chronic   . Hyperlipidemia     Past Surgical History  Procedure Date  . Orchiectomy     left  . Tympanic membranes   . Arthroscopic knee surgery     left    Family History  Problem Relation Age of Onset  . Cancer Mother 73    Lung   He does not know his father's history    . Coronary artery disease Maternal Grandfather 54    History   Social History  . Marital  Status: Single    Spouse Name: N/A    Number of Children: N/A  . Years of Education: N/A   Occupational History  . Not on file.   Social History Main Topics  . Smoking status: Former Smoker -- 1.0 packs/day for 30 years    Types: Cigarettes    Quit date: 09/10/2003  . Smokeless tobacco: Not on file  . Alcohol Use: Yes (Drinks nightly)  . Drug Use: Not on file  . Sexually Active: Not on file   Other Topics Concern  . Not on file   Social History Narrative   Lives alone.    ROS:  Positive for occasional headaches, nausea, reflux, leg cramping. Otherwise as stated in the HPI and negative for all other systems.  PHYSICAL EXAM BP 150/90  Pulse 66  Ht 5' 7" (1.702 m)  Wt 190 lb (86.183 kg)  BMI 29.76 kg/m2 GENERAL:  Well appearing HEENT:  Pupils equal round and reactive, fundi not visualized, oral mucosa unremarkable NECK:  No jugular venous distention, waveform within normal limits, carotid upstroke brisk and symmetric, no bruits, no thyromegaly LYMPHATICS:  No cervical, inguinal adenopathy LUNGS:  Clear to auscultation bilaterally BACK:  No CVA tenderness CHEST:  Gynecomastia mild   HEART:  PMI not displaced or sustained,S1 and S2 within normal limits, no S3, no S4, no clicks, no rubs, no murmurs ABD:  Flat, positive bowel sounds normal in frequency in pitch, no bruits, no rebound, no guarding, no midline pulsatile mass, no hepatomegaly, no splenomegaly EXT:  2 plus pulses throughout, no edema, no cyanosis no clubbing SKIN:  No rashes no nodules NEURO:  Cranial nerves II through XII grossly intact, motor grossly intact throughout PSYCH:  Cognitively intact, oriented to person place and time  EKG:  Sinus rhythm, rate 62, axis within normal limits, intervals within normal limits, no acute ST-T wave changes.  09/10/11   ASSESSMENT AND PLAN    

## 2011-09-10 NOTE — Assessment & Plan Note (Signed)
His blood pressure will be further addressed with the addition of the beta blocker.

## 2011-09-11 ENCOUNTER — Encounter (HOSPITAL_BASED_OUTPATIENT_CLINIC_OR_DEPARTMENT_OTHER): Payer: Self-pay

## 2011-09-11 ENCOUNTER — Other Ambulatory Visit: Payer: Self-pay

## 2011-09-11 ENCOUNTER — Inpatient Hospital Stay (HOSPITAL_COMMUNITY)
Admission: AD | Admit: 2011-09-11 | Discharge: 2011-09-17 | DRG: 107 | Disposition: A | Discharge status: Home / Self Care | Payer: BC Managed Care – PPO | Source: Ambulatory Visit | Attending: Cardiothoracic Surgery | Admitting: Cardiothoracic Surgery

## 2011-09-11 ENCOUNTER — Encounter (HOSPITAL_BASED_OUTPATIENT_CLINIC_OR_DEPARTMENT_OTHER)
Admission: RE | Disposition: A | Discharge status: Hospice | Payer: Self-pay | Source: Ambulatory Visit | Attending: Cardiovascular Disease

## 2011-09-11 ENCOUNTER — Inpatient Hospital Stay (HOSPITAL_BASED_OUTPATIENT_CLINIC_OR_DEPARTMENT_OTHER)
Admission: RE | Admit: 2011-09-11 | Discharge: 2011-09-11 | Disposition: A | Discharge status: Hospice | Payer: BC Managed Care – PPO | Source: Ambulatory Visit | Attending: Cardiovascular Disease | Admitting: Cardiovascular Disease

## 2011-09-11 ENCOUNTER — Inpatient Hospital Stay (HOSPITAL_BASED_OUTPATIENT_CLINIC_OR_DEPARTMENT_OTHER): Admit: 2011-09-11 | Payer: Self-pay | Admitting: Cardiovascular Disease

## 2011-09-11 ENCOUNTER — Inpatient Hospital Stay (HOSPITAL_COMMUNITY): Payer: BC Managed Care – PPO

## 2011-09-11 ENCOUNTER — Encounter (HOSPITAL_BASED_OUTPATIENT_CLINIC_OR_DEPARTMENT_OTHER): Payer: Self-pay | Admitting: *Deleted

## 2011-09-11 DIAGNOSIS — I2510 Atherosclerotic heart disease of native coronary artery without angina pectoris: Secondary | ICD-10-CM

## 2011-09-11 DIAGNOSIS — Z6827 Body mass index (BMI) 27.0-27.9, adult: Secondary | ICD-10-CM

## 2011-09-11 DIAGNOSIS — Z7982 Long term (current) use of aspirin: Secondary | ICD-10-CM

## 2011-09-11 DIAGNOSIS — Z8249 Family history of ischemic heart disease and other diseases of the circulatory system: Secondary | ICD-10-CM

## 2011-09-11 DIAGNOSIS — I10 Essential (primary) hypertension: Secondary | ICD-10-CM | POA: Insufficient documentation

## 2011-09-11 DIAGNOSIS — I200 Unstable angina: Secondary | ICD-10-CM | POA: Diagnosis present

## 2011-09-11 DIAGNOSIS — B182 Chronic viral hepatitis C: Secondary | ICD-10-CM | POA: Insufficient documentation

## 2011-09-11 DIAGNOSIS — Z79899 Other long term (current) drug therapy: Secondary | ICD-10-CM

## 2011-09-11 DIAGNOSIS — J9819 Other pulmonary collapse: Secondary | ICD-10-CM | POA: Diagnosis not present

## 2011-09-11 DIAGNOSIS — E871 Hypo-osmolality and hyponatremia: Secondary | ICD-10-CM | POA: Diagnosis not present

## 2011-09-11 DIAGNOSIS — I808 Phlebitis and thrombophlebitis of other sites: Secondary | ICD-10-CM | POA: Diagnosis not present

## 2011-09-11 DIAGNOSIS — I2582 Chronic total occlusion of coronary artery: Secondary | ICD-10-CM | POA: Diagnosis present

## 2011-09-11 DIAGNOSIS — I4891 Unspecified atrial fibrillation: Secondary | ICD-10-CM | POA: Diagnosis not present

## 2011-09-11 DIAGNOSIS — E785 Hyperlipidemia, unspecified: Secondary | ICD-10-CM | POA: Diagnosis present

## 2011-09-11 DIAGNOSIS — D62 Acute posthemorrhagic anemia: Secondary | ICD-10-CM | POA: Diagnosis not present

## 2011-09-11 DIAGNOSIS — Z01810 Encounter for preprocedural cardiovascular examination: Secondary | ICD-10-CM

## 2011-09-11 DIAGNOSIS — D696 Thrombocytopenia, unspecified: Secondary | ICD-10-CM | POA: Diagnosis not present

## 2011-09-11 DIAGNOSIS — E8779 Other fluid overload: Secondary | ICD-10-CM | POA: Diagnosis not present

## 2011-09-11 DIAGNOSIS — B1920 Unspecified viral hepatitis C without hepatic coma: Secondary | ICD-10-CM | POA: Diagnosis present

## 2011-09-11 DIAGNOSIS — Z87891 Personal history of nicotine dependence: Secondary | ICD-10-CM

## 2011-09-11 LAB — CBC
HCT: 40.4 % (ref 39.0–52.0)
Hemoglobin: 14.7 g/dL (ref 13.0–17.0)
MCH: 33.8 pg (ref 26.0–34.0)
MCHC: 36.4 g/dL — ABNORMAL HIGH (ref 30.0–36.0)
RDW: 12.3 % (ref 11.5–15.5)

## 2011-09-11 LAB — PROTIME-INR
INR: 0.99 (ref 0.00–1.49)
Prothrombin Time: 13.3 seconds (ref 11.6–15.2)

## 2011-09-11 LAB — TYPE AND SCREEN
ABO/RH(D): O POS
Antibody Screen: NEGATIVE

## 2011-09-11 LAB — COMPREHENSIVE METABOLIC PANEL
BUN: 10 mg/dL (ref 6–23)
Calcium: 8.8 mg/dL (ref 8.4–10.5)
Creatinine, Ser: 0.57 mg/dL (ref 0.50–1.35)
GFR calc Af Amer: >90 mL/min (ref >90)
Glucose, Bld: 149 mg/dL — ABNORMAL HIGH (ref 70–99)
Total Protein: 6.6 g/dL (ref 6.0–8.3)

## 2011-09-11 LAB — SURGICAL PCR SCREEN
MRSA, PCR: NEGATIVE
Staphylococcus aureus: NEGATIVE

## 2011-09-11 LAB — DIFFERENTIAL
Eosinophils Absolute: 0.4 10*3/uL (ref 0.0–0.7)
Lymphocytes Relative: 32 % (ref 12–46)
Lymphs Abs: 1.3 10*3/uL (ref 0.7–4.0)
Neutro Abs: 2.1 10*3/uL (ref 1.7–7.7)
Neutrophils Relative %: 50 % (ref 43–77)

## 2011-09-11 LAB — ABO/RH: ABO/RH(D): O POS

## 2011-09-11 LAB — TSH: TSH: 1.815 u[IU]/mL (ref 0.350–4.500)

## 2011-09-11 SURGERY — JV LEFT HEART CATHETERIZATION WITH CORONARY ANGIOGRAM
Anesthesia: Moderate Sedation

## 2011-09-11 MED ORDER — SODIUM CHLORIDE 0.9 % IV SOLN: INTRAVENOUS | Status: DC

## 2011-09-11 MED ORDER — NITROGLYCERIN 0.4 MG SL SUBL: 0.4000 mg | SUBLINGUAL_TABLET | SUBLINGUAL | Status: DC | PRN

## 2011-09-11 MED ORDER — CHLORHEXIDINE GLUCONATE 4 % EX LIQD
60.0000 mL | Freq: Once | CUTANEOUS | Status: AC
  Administered 2011-09-12: 4 via TOPICAL

## 2011-09-11 MED ORDER — METOPROLOL TARTRATE 25 MG PO TABS: 25.0000 mg | ORAL_TABLET | Freq: Two times a day (BID) | ORAL | Status: DC

## 2011-09-11 MED ORDER — SODIUM CHLORIDE 0.9 % IV SOLN: 250.0000 mL | INTRAVENOUS | Status: DC | PRN

## 2011-09-11 MED ORDER — DEXTROSE 5 % IV SOLN
1.5000 g | INTRAVENOUS | Status: DC
  Filled 2011-09-11: qty 1.5

## 2011-09-11 MED ORDER — ASPIRIN 81 MG PO TABS: 81.0000 mg | ORAL_TABLET | Freq: Every day | ORAL | Status: DC

## 2011-09-11 MED ORDER — DIAZEPAM 5 MG PO TABS
5.0000 mg | ORAL_TABLET | ORAL | Status: DC | PRN
  Administered 2011-09-11: 5 mg via ORAL
  Filled 2011-09-11: qty 1

## 2011-09-11 MED ORDER — LISINOPRIL 5 MG PO TABS: 5.0000 mg | ORAL_TABLET | Freq: Every day | ORAL | Status: DC

## 2011-09-11 MED ORDER — DOPAMINE-DEXTROSE 3.2-5 MG/ML-% IV SOLN
2.0000 ug/kg/min | INTRAVENOUS | Status: DC
  Filled 2011-09-11: qty 250

## 2011-09-11 MED ORDER — MAGNESIUM SULFATE 50 % IJ SOLN
40.0000 meq | INTRAMUSCULAR | Status: DC
  Filled 2011-09-11: qty 10

## 2011-09-11 MED ORDER — AMLODIPINE BESYLATE 10 MG PO TABS: 10.0000 mg | ORAL_TABLET | Freq: Every day | ORAL | Status: DC

## 2011-09-11 MED ORDER — CHLORHEXIDINE GLUCONATE 4 % EX LIQD
60.0000 mL | Freq: Once | CUTANEOUS | Status: AC
  Administered 2011-09-11: 4 via TOPICAL

## 2011-09-11 MED ORDER — EPINEPHRINE HCL 1 MG/ML IJ SOLN
0.5000 ug/min | INTRAVENOUS | Status: DC
  Filled 2011-09-11: qty 4

## 2011-09-11 MED ORDER — DEXTROSE 5 % IV SOLN: 1.5000 g | INTRAVENOUS | Status: DC

## 2011-09-11 MED ORDER — OXYCODONE-ACETAMINOPHEN 5-325 MG PO TABS: 1.0000 | ORAL_TABLET | ORAL | Status: DC | PRN

## 2011-09-11 MED ORDER — AMLODIPINE BESYLATE 10 MG PO TABS
10.0000 mg | ORAL_TABLET | Freq: Every day | ORAL | Status: DC
Start: 2011-09-12 — End: 2011-09-12
  Filled 2011-09-11: qty 1

## 2011-09-11 MED ORDER — CHLORHEXIDINE GLUCONATE 4 % EX LIQD
60.0000 mL | Freq: Once | CUTANEOUS | Status: AC
  Filled 2011-09-11: qty 60

## 2011-09-11 MED ORDER — VANCOMYCIN HCL 1000 MG IV SOLR: 1250.0000 mg | INTRAVENOUS | Status: DC

## 2011-09-11 MED ORDER — VANCOMYCIN HCL 1000 MG IV SOLR
1250.0000 mg | INTRAVENOUS | Status: AC
  Administered 2011-09-12: 1250 mg via INTRAVENOUS
  Filled 2011-09-11: qty 1250

## 2011-09-11 MED ORDER — ONDANSETRON HCL 4 MG/2ML IJ SOLN: 4.0000 mg | Freq: Four times a day (QID) | INTRAMUSCULAR | Status: DC | PRN

## 2011-09-11 MED ORDER — SODIUM CHLORIDE 0.9 % IV SOLN
INTRAVENOUS | Status: DC
  Filled 2011-09-11: qty 1

## 2011-09-11 MED ORDER — ATORVASTATIN CALCIUM 80 MG PO TABS: 80.0000 mg | ORAL_TABLET | Freq: Every day | ORAL | Status: DC

## 2011-09-11 MED ORDER — BISACODYL 5 MG PO TBEC
5.0000 mg | DELAYED_RELEASE_TABLET | Freq: Once | ORAL | Status: DC
  Filled 2011-09-11: qty 1

## 2011-09-11 MED ORDER — TEMAZEPAM 15 MG PO CAPS: 15.0000 mg | ORAL_CAPSULE | Freq: Once | ORAL | Status: AC | PRN

## 2011-09-11 MED ORDER — SODIUM CHLORIDE 0.9 % IJ SOLN: 3.0000 mL | Freq: Two times a day (BID) | INTRAMUSCULAR | Status: DC

## 2011-09-11 MED ORDER — PHENYLEPHRINE HCL 10 MG/ML IJ SOLN
30.0000 ug/min | INTRAVENOUS | Status: DC
  Filled 2011-09-11: qty 2

## 2011-09-11 MED ORDER — DEXTROSE 5 % IV SOLN
1.5000 g | INTRAVENOUS | Status: AC
  Administered 2011-09-12: .75 g via INTRAVENOUS
  Administered 2011-09-12: 1.5 g via INTRAVENOUS
  Filled 2011-09-11: qty 1.5

## 2011-09-11 MED ORDER — ACETAMINOPHEN 325 MG PO TABS: 650.0000 mg | ORAL_TABLET | ORAL | Status: DC | PRN

## 2011-09-11 MED ORDER — CEFUROXIME SODIUM 750 MG IJ SOLR
750.0000 mg | INTRAMUSCULAR | Status: DC
  Filled 2011-09-11: qty 750

## 2011-09-11 MED ORDER — PANTOPRAZOLE SODIUM 40 MG PO TBEC: 40.0000 mg | DELAYED_RELEASE_TABLET | Freq: Every day | ORAL | Status: DC

## 2011-09-11 MED ORDER — VERAPAMIL HCL 2.5 MG/ML IV SOLN
INTRAVENOUS | Status: AC
  Administered 2011-09-12: 09:00:00
  Filled 2011-09-11: qty 2.5

## 2011-09-11 MED ORDER — NITROGLYCERIN IN D5W 200-5 MCG/ML-% IV SOLN
2.0000 ug/min | INTRAVENOUS | Status: DC
  Filled 2011-09-11: qty 250

## 2011-09-11 MED ORDER — DOPAMINE-DEXTROSE 3.2-5 MG/ML-% IV SOLN: 2.0000 ug/kg/min | INTRAVENOUS | Status: DC

## 2011-09-11 MED ORDER — LISINOPRIL 40 MG PO TABS
40.0000 mg | ORAL_TABLET | Freq: Every day | ORAL | Status: DC
  Filled 2011-09-11: qty 1

## 2011-09-11 MED ORDER — ASPIRIN 81 MG PO CHEW
324.0000 mg | CHEWABLE_TABLET | ORAL | Status: AC
  Administered 2011-09-11: 243 mg via ORAL

## 2011-09-11 MED ORDER — POTASSIUM CHLORIDE 2 MEQ/ML IV SOLN
80.0000 meq | INTRAVENOUS | Status: DC
  Filled 2011-09-11: qty 40

## 2011-09-11 MED ORDER — CARVEDILOL 25 MG PO TABS: 25.0000 mg | ORAL_TABLET | Freq: Two times a day (BID) | ORAL | Status: DC

## 2011-09-11 MED ORDER — ASPIRIN 300 MG RE SUPP: 300.0000 mg | RECTAL | Status: DC

## 2011-09-11 MED ORDER — METOPROLOL TARTRATE 12.5 MG HALF TABLET
12.5000 mg | ORAL_TABLET | Freq: Once | ORAL | Status: AC
  Administered 2011-09-12: 12.5 mg via ORAL
  Filled 2011-09-11: qty 1

## 2011-09-11 MED ORDER — SODIUM CHLORIDE 0.9 % IV SOLN
0.1000 ug/kg/h | INTRAVENOUS | Status: DC
  Filled 2011-09-11: qty 4

## 2011-09-11 MED ORDER — SODIUM CHLORIDE 0.9 % IV SOLN
INTRAVENOUS | Status: DC
  Filled 2011-09-11: qty 40

## 2011-09-11 MED ORDER — ASPIRIN EC 81 MG PO TBEC
81.0000 mg | DELAYED_RELEASE_TABLET | Freq: Every day | ORAL | Status: DC
  Filled 2011-09-11: qty 1

## 2011-09-11 MED ORDER — SODIUM CHLORIDE 0.9 % IJ SOLN: 3.0000 mL | INTRAMUSCULAR | Status: DC | PRN

## 2011-09-11 MED ORDER — SODIUM CHLORIDE 0.9 % IV SOLN
INTRAVENOUS | Status: DC
  Administered 2011-09-11: 07:00:00 via INTRAVENOUS

## 2011-09-11 NOTE — Plan of Care (Signed)
Problem: Phase III Progression Outcomes Goal: Vascular site scale level 0 - I Vascular Site Scale Level 0: No bruising/bleeding/hematoma Level I (Mild): Bruising/Ecchymosis, minimal bleeding/ooozing, palpable hematoma < 3 cm Level II (Moderate): Bleeding not affecting hemodynamic parameters, pseudoaneurysm, palpable hematoma > 3 cm Outcome: Completed/Met Date Met:  09/11/11 Level 0 Goal: Discharge plan remains appropriate-arrangements made Outcome: Not Applicable Date Met:  09/11/11 Pt for cvts consult

## 2011-09-11 NOTE — OR Nursing (Signed)
Report called to Jessica, RN on 3700 

## 2011-09-11 NOTE — H&P (View-Only) (Signed)
HPI The patient presents for evaluation of chest discomfort. This has been happening probably over 3 months. However, it is been getting progressively worse with increasing frequency and severity. He's now getting chest discomfort walking 20 feet on level ground. He describes a tightness in his mid chest. Last night it was 9/10 in intensity. It may last for 10 minutes at a time. He has no radiation to his jaw or arms. He has a mild sensation of breathlessness but no frank shortness of breath, PND or orthopnea. He's not describing nausea or vomiting with this. He's had no diaphoresis. He has no palpitations, presyncope or syncope. He has not been active recently because of this.  He has never had this sensation previously.  No Known Allergies  Current Outpatient Prescriptions  Medication Sig Dispense Refill  . amLODipine (NORVASC) 10 MG tablet Take 1 tablet (10 mg total) by mouth daily.  90 tablet  0  . aspirin 81 MG tablet Take 81 mg by mouth daily.      . carvedilol (COREG) 25 MG tablet Take 1 tablet (25 mg total) by mouth 2 (two) times daily with a meal.  60 tablet  3  . lansoprazole (PREVACID) 30 MG capsule TAKE ONE CAPSULE BY MOUTH EVERY DAY  90 capsule  1  . lisinopril (PRINIVIL,ZESTRIL) 40 MG tablet TAKE ONE TABLET BY MOUTH EVERY DAY  90 tablet  0  . nitroGLYCERIN (NITROSTAT) 0.4 MG SL tablet Place 1 tablet (0.4 mg total) under the tongue every 5 (five) minutes as needed for chest pain (no more than 3 tabs a day).  20 tablet  0    Past Medical History  Diagnosis Date  . HTN (hypertension)   . Hep C w/o coma, chronic   . Hyperlipidemia     Past Surgical History  Procedure Date  . Orchiectomy     left  . Tympanic membranes   . Arthroscopic knee surgery     left    Family History  Problem Relation Age of Onset  . Cancer Mother 62    Lung   He does not know his father's history    . Coronary artery disease Maternal Grandfather 40    History   Social History  . Marital  Status: Single    Spouse Name: N/A    Number of Children: N/A  . Years of Education: N/A   Occupational History  . Not on file.   Social History Main Topics  . Smoking status: Former Smoker -- 1.0 packs/day for 30 years    Types: Cigarettes    Quit date: 09/10/2003  . Smokeless tobacco: Not on file  . Alcohol Use: Yes (Drinks nightly)  . Drug Use: Not on file  . Sexually Active: Not on file   Other Topics Concern  . Not on file   Social History Narrative   Lives alone.    ROS:  Positive for occasional headaches, nausea, reflux, leg cramping. Otherwise as stated in the HPI and negative for all other systems.  PHYSICAL EXAM BP 150/90  Pulse 66  Ht 5\' 7"  (1.702 m)  Wt 190 lb (86.183 kg)  BMI 29.76 kg/m2 GENERAL:  Well appearing HEENT:  Pupils equal round and reactive, fundi not visualized, oral mucosa unremarkable NECK:  No jugular venous distention, waveform within normal limits, carotid upstroke brisk and symmetric, no bruits, no thyromegaly LYMPHATICS:  No cervical, inguinal adenopathy LUNGS:  Clear to auscultation bilaterally BACK:  No CVA tenderness CHEST:  Gynecomastia mild  HEART:  PMI not displaced or sustained,S1 and S2 within normal limits, no S3, no S4, no clicks, no rubs, no murmurs ABD:  Flat, positive bowel sounds normal in frequency in pitch, no bruits, no rebound, no guarding, no midline pulsatile mass, no hepatomegaly, no splenomegaly EXT:  2 plus pulses throughout, no edema, no cyanosis no clubbing SKIN:  No rashes no nodules NEURO:  Cranial nerves II through XII grossly intact, motor grossly intact throughout PSYCH:  Cognitively intact, oriented to person place and time  EKG:  Sinus rhythm, rate 62, axis within normal limits, intervals within normal limits, no acute ST-T wave changes.  09/10/11   ASSESSMENT AND PLAN

## 2011-09-11 NOTE — Progress Notes (Signed)
VASCULAR LAB PRELIMINARY  PRELIMINARY  PRELIMINARY  PRELIMINARY  Pre-op Cardiac Surgery  Carotid Findings:  Bilateral:  No evidence of hemodynamically significant internal carotid artery stenosis.   Vertebral artery flow is antegrade.     Upper Extremity Right Left  Brachial Pressures 150 Triphasic 154 Triphasic  Radial Waveforms Triphasic Triphasic  Ulnar Waveforms Triphasic Triphasic  Palmar Arch (Allen's Test) Normal Normal   Findings:  Palmer arch evaluation - Doppler waveforms remained normal bilaterally with both radial and ulnar compressions.   Lower  Extremity Right Left  Dorsalis Pedis    Anterior Tibial    Posterior Tibial    Ankle/Brachial Indices      Findings:  Palpable pedal pulses bilaterally   Larrie Fraizer D, RVS 09/11/2011, 8:03 PM

## 2011-09-11 NOTE — Consult Note (Signed)
[address]            [place] [address]          [phone]       Manuel Bell [facility] Medical Record #[id number] Date of Birth: 06-11-1956  Referring: No ref. provider found Primary Care: Marga Melnick, MD, MD  Chief Complaint:  Chest pain unstable angina-  History of Present Illness:     The patient is a 56 year old Caucasian male admitted with unstable angina. His chest pain has been progressing in intensity and frequency over the past several weeks. The patient currently at the time of admission was getting chest pain walking only 20 feet on level ground. The pain is relieved by rest. There is no associated radiation but there is associated shortness of breath without nausea. There is been no syncope or lightheadedness. This symptom is new and has limited his activity level. He currently works full time and lives alone. The patient's risk factors include a past history of smoking, hypertension, hyperlipidemia, and positive family history of CAD.  Current Activity/ Functional Status: Good   Past Medical History  Diagnosis Date  . HTN (hypertension)   . Hep C w/o coma, chronic   . Hyperlipidemia     Past Surgical History  Procedure Date  . Orchiectomy     left  . Tympanic membranes   . Arthroscopic knee surgery     left    History  Smoking status  . Former Smoker -- 1.0 packs/day for 30 years  . Types: Cigarettes  . Quit date: 09/10/2003  Smokeless tobacco  . Not on file    History  Alcohol Use  . Yes    History   Social History  . Marital Status: Single    Spouse Name: N/A    Number of Children: N/A  . Years of Education: N/A   Occupational History  . Not on file.   Social History Main Topics  . Smoking status: Former Smoker -- 1.0 packs/day for 30 years    Types: Cigarettes    Quit date: 09/10/2003  . Smokeless tobacco: Not on file  . Alcohol Use: Yes  . Drug Use: Not on file  . Sexually Active:  Not on file   Other Topics Concern  . Not on file   Social History Narrative   Lives alone.    No Known Allergies  Current Facility-Administered Medications  Medication Dose Route Frequency Provider Last Rate Last Dose  . aspirin suppository 300 mg  300 mg Rectal NOW Kathleene Hazel, MD      . bisacodyl (DULCOLAX) EC tablet 5 mg  5 mg Oral Once Kerin Perna, MD      . chlorhexidine (HIBICLENS) 4 % liquid 4 application  60 mL Topical Once Kerin Perna, MD      . chlorhexidine (HIBICLENS) 4 % liquid 4 application  60 mL Topical Once Kerin Perna, MD      . chlorhexidine (HIBICLENS) 4 % liquid 4 application  60 mL Topical Once Kerin Perna, MD      . diazepam (VALIUM) tablet 5-10 mg  5-10 mg Oral Q4H PRN Kerin Perna, MD      . metoprolol tartrate (LOPRESSOR) tablet 12.5 mg  12.5 mg Oral Once Kerin Perna, MD      . temazepam (RESTORIL) capsule 15 mg  15 mg Oral Once PRN Kerin Perna, MD       Facility-Administered Medications Ordered in Other Encounters  Medication Dose Route Frequency Provider Last Rate Last Dose  . aspirin chewable tablet 324 mg  324 mg Oral Pre-Cath Rollene Rotunda, MD   243 mg at 09/11/11 0650  . DISCONTD: 0.9 %  sodium chloride infusion  250 mL Intravenous PRN Rollene Rotunda, MD      . DISCONTD: 0.9 %  sodium chloride infusion   Intravenous Continuous Rollene Rotunda, MD 75 mL/hr at 09/11/11 0700    . DISCONTD: 0.9 %  sodium chloride infusion   Intravenous Continuous Kathleene Hazel, MD      . DISCONTD: acetaminophen (TYLENOL) tablet 650 mg  650 mg Oral Q4H PRN Kathleene Hazel, MD      . DISCONTD: amLODipine (NORVASC) tablet 10 mg  10 mg Oral Daily Kathleene Hazel, MD      . DISCONTD: aspirin tablet 81 mg  81 mg Oral Daily Kathleene Hazel, MD      . DISCONTD: atorvastatin (LIPITOR) tablet 80 mg  80 mg Oral q1800 Kathleene Hazel, MD      . DISCONTD: carvedilol (COREG) tablet 25 mg  25 mg Oral BID WC  Kathleene Hazel, MD      . DISCONTD: lisinopril (PRINIVIL,ZESTRIL) tablet 5 mg  5 mg Oral Daily Kathleene Hazel, MD      . DISCONTD: metoprolol tartrate (LOPRESSOR) tablet 25 mg  25 mg Oral BID Kathleene Hazel, MD      . DISCONTD: nitroGLYCERIN (NITROSTAT) SL tablet 0.4 mg  0.4 mg Sublingual Q5 min PRN Kathleene Hazel, MD      . DISCONTD: ondansetron (ZOFRAN) injection 4 mg  4 mg Intravenous Q6H PRN Kathleene Hazel, MD      . DISCONTD: oxyCODONE-acetaminophen (PERCOCET) 5-325 MG per tablet 1-2 tablet  1-2 tablet Oral Q4H PRN Kathleene Hazel, MD      . DISCONTD: pantoprazole (PROTONIX) EC tablet 40 mg  40 mg Oral Daily Kathleene Hazel, MD      . DISCONTD: sodium chloride 0.9 % injection 3 mL  3 mL Intravenous Q12H Rollene Rotunda, MD      . DISCONTD: sodium chloride 0.9 % injection 3 mL  3 mL Intravenous PRN Rollene Rotunda, MD        Prescriptions prior to admission  Medication Sig Dispense Refill  . amLODipine (NORVASC) 10 MG tablet Take 1 tablet (10 mg total) by mouth daily.  90 tablet  0  . aspirin EC 81 MG tablet Take 81 mg by mouth daily.      . carvedilol (COREG) 25 MG tablet Take 1 tablet (25 mg total) by mouth 2 (two) times daily with a meal.  60 tablet  3  . lansoprazole (PREVACID) 30 MG capsule TAKE ONE CAPSULE BY MOUTH EVERY DAY  90 capsule  1  . lisinopril (PRINIVIL,ZESTRIL) 40 MG tablet TAKE ONE TABLET BY MOUTH EVERY DAY  90 tablet  0  . metoprolol tartrate (LOPRESSOR) 25 MG tablet Take 1 tablet (25 mg total) by mouth 2 (two) times daily.  60 tablet  11  . nitroGLYCERIN (NITROSTAT) 0.4 MG SL tablet Place 1 tablet (0.4 mg total) under the tongue every 5 (five) minutes as needed for chest pain (no more than 3 tabs a day).  20 tablet  0    Family History  Problem Relation Age of Onset  . Cancer Mother 19  Lung  . Coronary artery disease Maternal Grandfather 54     Review of Systems:     Cardiac Review of Systems: Y or  N  Chest Pain [ Y ]  Resting SOB Klaus.Mock   ] Exertional SOB  [ Y ]  Orthopnea Klaus.Mock  ]   Pedal Edema [   ]    Palpitations [  ] Syncope  [ N ]   Presyncope Klaus.Mock   ]  General Review of Systems: [Y] = yes [  ]=no Constitional: recent weight change Klaus.Mock  ]; anorexia [  ]; fatigue [  ]; nausea [  ]; night sweats [  ]; fever Klaus.Mock  ]; or chills [  ];                                                                                                                                          Dental: poor dentition[N  ]; Last Dentist visit one year  Eye : blurred vision [  ]; diplopia [   ]; vision changes [  ];  Amaurosis fugax[  ]; Resp: cough [  ];  wheezing[ N ];  hemoptysis[  ]; shortness of breath[  ]; paroxysmal nocturnal dyspnea[  ]; dyspnea on exertion[  ]; or orthopnea[  ];  GI:  gallstones[ N ], vomiting[ N ];  dysphagia[  ]; melena[  ];  hematochezia [  ]; heartburn[  ];   Hx of  Colonoscopy[  ]; GU: kidney stones [  ]; hematuria[  ];   dysuria [  ];  nocturia[  ];  history of     obstruction [  ];             Skin: rash, swelling[ N ];, hair loss[  ];  peripheral edema[ N ];  or itching[  ]; Musculosketetal: myalgias[  ];  joint swelling[  ];  joint erythema[  ];  joint pain[  ];  back pain[Y  ];  Heme/Lymph: bruising[  ];  bleeding[  ];  anemia[  ];  Neuro: TIA[  ];  headaches[  ];  stroke[  ];  vertigo[  ];  seizures[  N];   paresthesias[  ];  difficulty walking[  ];  Psych:depression[  ]; anxiety[  ];  Endocrine: diabetesN[  ];  thyroid dysfunction[  ];  Immunizations: Flu [  ]; Pneumococcal[  ];  Other:  Physical Exam: BP 124/76  Pulse 60  Temp(Src) 97.7 F (36.5 C) (Oral)  Resp 20  SpO2 97%  Exam Vital signs blood pressure 140/88 pulse 70 sinus saturation rare 97% height 5 foot 7 weight 190 pounds General appearance middle-aged male no acute distress HEENT normocephalic pupils equal Neck no JVD mass or carotid bruit Thorax no tenderness deformity clear breath sounds bilaterally Cardiac normal  and regular rhythm, no murmur or gallop Abdomen soft nontender without pulsatile mass or organomegaly Extremities  no clubbing cyanosis edema Vascular palpable pulses in all extremities no venous insufficiency noted Neurologic alert and oriented no focal motor deficit  Diagnostic Studies & Laboratory data:     Recent Radiology Findings:   Dg Chest 2 View  09/11/2011  *RADIOLOGY REPORT*  Clinical Data: Preoperative respiratory exam for CABG  CHEST - 2 VIEW  Comparison: 08/15/2011  Findings: Heart size is normal.  Mediastinal shadows are normal. Lungs are clear.  No effusions.  Ordinary degenerative changes effect the spine.  IMPRESSION: No active disease  Original Report Authenticated By: Thomasenia Sales, M.D.      Recent Lab Findings: Lab Results  Component Value Date   WBC 4.5 09/11/2011   HGB 14.7 09/11/2011   HCT 40.4 09/11/2011   PLT 120* 09/11/2011   GLUCOSE 149* 09/11/2011   CHOL 176 08/27/2011   TRIG 85.0 08/27/2011   HDL 57.10 08/27/2011   LDLDIRECT 146.5 11/29/2009   LDLCALC 102* 08/27/2011   ALT 155* 09/11/2011   AST 117* 09/11/2011   NA 137 09/11/2011   K 3.8 09/11/2011   CL 102 09/11/2011   CREATININE 0.57 09/11/2011   BUN 10 09/11/2011   CO2 27 09/11/2011   TSH 1.815 09/11/2011   INR 0.99 09/11/2011   HGBA1C 5.5 08/27/2011      Assessment / Plan:    Severe three-vessel coronary disease with unstable angina and preserved LV function, LVEDP 24  History of hepatitis C with mild elevation in transaminases  Plan multivessel bypass grafting in a.m. with potential targets to the LAD, diagonal, ramus, marginal, posterior descending. Procedure indications risks benefits discussed the patient. He understands and agrees to proceed with surgery.        @me1 @ 09/11/2011 5:15 PM

## 2011-09-11 NOTE — Interval H&P Note (Signed)
History and Physical Interval Note:  09/11/2011 7:10 AM  Manuel Bell  has presented today for cardiac cath with the diagnosis of cp  The various methods of treatment have been discussed with the patient and family. After consideration of risks, benefits and other options for treatment, the patient has consented to  Procedure(s) (LRB): JV LEFT HEART CATHETERIZATION WITH CORONARY ANGIOGRAM (N/A) as a surgical intervention .  The patients' history has been reviewed, patient examined, no change in status, stable for surgery.  I have reviewed the patients' chart and labs.  Questions were answered to the patient's satisfaction.     [name]

## 2011-09-11 NOTE — OR Nursing (Signed)
Tegaderm dressing applied, site level 0, bedrest begins at 0825 

## 2011-09-11 NOTE — OR Nursing (Signed)
Dr Mcalhany at bedside to discuss results and treatment plan with pt and family 

## 2011-09-11 NOTE — CV Procedure (Signed)
   Cardiac Catheterization Operative Report  Manuel Bell [id number] 3/20/20138:16 AM Marga Melnick, MD, MD  Procedure Performed:  1. Left Heart Catheterization 2. Selective Coronary Angiography 3. Left ventricular angiogram  Operator: Verne Carrow, MD  Indication:  Unstable angina                                     Procedure Details: The risks, benefits, complications, treatment options, and expected outcomes were discussed with the patient. The patient and/or family concurred with the proposed plan, giving informed consent. The patient was brought to the cath lab after IV hydration was begun and oral premedication was given. The patient was further sedated with Versed and Fentanyl. The right groin was prepped and draped in the usual manner. Using the modified Seldinger access technique, a 4 French sheath was placed in the right femoral artery. Standard diagnostic catheters were used to perform selective coronary angiography. A pigtail catheter was used to perform a left ventricular angiogram.  There were no immediate complications. The patient was taken to the recovery area in stable condition.   Hemodynamic Findings: Central aortic pressure: 141/80 Left ventricular pressure: 145/25/24  Angiographic Findings:  Left main: No obstructive disease.   Left Anterior Descending Artery: Large caliber vessel that courses to the apex. The proximal and mid vessel is heavily calcified. The proximal vessel has a 99% stenosis. The mid vessel is diffusely disease with discreet 80% stenosis. The first diagonal branch has 100% proximal occlusion and is heavily calcified. This appears to be a moderate sized vessel. The second diagonal is a smaller caliber vessel with plaque disease. The distal LAD has luminal irregularities.   Circumflex Artery: Ramus intermediate branch with 70% proximal stenosis, moderate sized vessel. The mid Circumflex has a 90% stenosis at the takeoff of a small  caliber marginal branch.   Right Coronary Artery: Large, dominant vessel with 50% proximal stenosis, 70% mid stenosis. There is a 40% stenosis at the takeoff of the PDA.   Left Ventricular Angiogram: Hypokinesis of the anterior wall and apex. LVEF= 45-50%.    Impression: 1. Triple vessel CAD 2. Segmental LV systolic dysfunction 3. Unstable angina (chest pain on table with injections)  Recommendations: We will admit the pt to telemetry. He has multi-vessel CAD with overall preserved LV function. I will review the films with Dr. Antoine Poche and will make further plans for revascularization. I think surgical revascularization is the best option given his severe LAD disease with heavy calcification, occluded Diagonal and moderate to severe disease in the other vessels.        Complications:  None. The patient tolerated the procedure well.

## 2011-09-11 NOTE — OR Nursing (Signed)
Transported to 3703 via stretcher, on monitor

## 2011-09-12 ENCOUNTER — Inpatient Hospital Stay (HOSPITAL_COMMUNITY): Payer: BC Managed Care – PPO

## 2011-09-12 ENCOUNTER — Inpatient Hospital Stay (HOSPITAL_COMMUNITY): Payer: BC Managed Care – PPO | Admitting: Anesthesiology

## 2011-09-12 ENCOUNTER — Encounter (HOSPITAL_COMMUNITY): Payer: Self-pay | Admitting: Anesthesiology

## 2011-09-12 ENCOUNTER — Encounter (HOSPITAL_COMMUNITY)
Admission: AD | Disposition: A | Discharge status: Home / Self Care | Payer: Self-pay | Source: Ambulatory Visit | Attending: Cardiothoracic Surgery

## 2011-09-12 DIAGNOSIS — I2510 Atherosclerotic heart disease of native coronary artery without angina pectoris: Secondary | ICD-10-CM

## 2011-09-12 HISTORY — PX: CORONARY ARTERY BYPASS GRAFT: SHX141

## 2011-09-12 LAB — BLOOD GAS, ARTERIAL
FIO2: 0.21 %
O2 Saturation: 96.4 %
pO2, Arterial: 81.8 mmHg (ref 80.0–100.0)

## 2011-09-12 LAB — POCT I-STAT 4, (NA,K, GLUC, HGB,HCT)
Glucose, Bld: 90 mg/dL (ref 70–99)
Glucose, Bld: 179 mg/dL — ABNORMAL HIGH (ref 70–99)
Glucose, Bld: 114 mg/dL — ABNORMAL HIGH (ref 70–99)
HCT: 38 % — ABNORMAL LOW (ref 39.0–52.0)
HCT: 31 % — ABNORMAL LOW (ref 39.0–52.0)
HCT: 31 % — ABNORMAL LOW (ref 39.0–52.0)
Hemoglobin: 12.9 g/dL — ABNORMAL LOW (ref 13.0–17.0)
Hemoglobin: 10.5 g/dL — ABNORMAL LOW (ref 13.0–17.0)
Potassium: 3.7 mEq/L (ref 3.5–5.1)
Potassium: 3.7 mEq/L (ref 3.5–5.1)
Potassium: 5 mEq/L (ref 3.5–5.1)
Potassium: 3.4 mEq/L — ABNORMAL LOW (ref 3.5–5.1)
Potassium: 3.3 mEq/L — ABNORMAL LOW (ref 3.5–5.1)
Sodium: 139 mEq/L (ref 135–145)
Sodium: 139 mEq/L (ref 135–145)
Sodium: 135 mEq/L (ref 135–145)

## 2011-09-12 LAB — POCT I-STAT 3, ART BLOOD GAS (G3+)
Acid-Base Excess: 3 mmol/L — ABNORMAL HIGH (ref 0.0–2.0)
Acid-Base Excess: 1 mmol/L (ref 0.0–2.0)
Acid-Base Excess: 1 mmol/L (ref 0.0–2.0)
O2 Saturation: 100 %
O2 Saturation: 100 %
O2 Saturation: 95 %
Patient temperature: 37.9
Patient temperature: 38
TCO2: 27 mmol/L (ref 0–100)
TCO2: 27 mmol/L (ref 0–100)
pCO2 arterial: 40.6 mmHg (ref 35.0–45.0)
pCO2 arterial: 36.5 mmHg (ref 35.0–45.0)
pH, Arterial: 7.407 (ref 7.350–7.450)
pH, Arterial: 7.407 (ref 7.350–7.450)
pO2, Arterial: 66 mmHg — ABNORMAL LOW (ref 80.0–100.0)

## 2011-09-12 LAB — CBC
HCT: 34.8 % — ABNORMAL LOW (ref 39.0–52.0)
Hemoglobin: 12.3 g/dL — ABNORMAL LOW (ref 13.0–17.0)
MCH: 33.5 pg (ref 26.0–34.0)
MCHC: 35.3 g/dL (ref 30.0–36.0)
MCV: 94.8 fL (ref 78.0–100.0)
Platelets: 97 10*3/uL — ABNORMAL LOW (ref 150–400)
RBC: 3.67 MIL/uL — ABNORMAL LOW (ref 4.22–5.81)
RDW: 12.3 % (ref 11.5–15.5)
RDW: 12.7 % (ref 11.5–15.5)
WBC: 9.3 10*3/uL (ref 4.0–10.5)

## 2011-09-12 LAB — URINALYSIS, ROUTINE W REFLEX MICROSCOPIC
Bilirubin Urine: NEGATIVE
Hgb urine dipstick: NEGATIVE
Protein, ur: NEGATIVE mg/dL
Urobilinogen, UA: 1 mg/dL (ref 0.0–1.0)

## 2011-09-12 LAB — CREATININE, SERUM
Creatinine, Ser: 0.54 mg/dL (ref 0.50–1.35)
GFR calc Af Amer: >90 mL/min (ref >90)
GFR calc non Af Amer: >90 mL/min (ref >90)

## 2011-09-12 LAB — HEMOGLOBIN AND HEMATOCRIT, BLOOD
HCT: 31.8 % — ABNORMAL LOW (ref 39.0–52.0)
Hemoglobin: 11.5 g/dL — ABNORMAL LOW (ref 13.0–17.0)

## 2011-09-12 LAB — POCT I-STAT, CHEM 8
Calcium, Ion: 1.14 mmol/L (ref 1.12–1.32)
Chloride: 106 mEq/L (ref 96–112)
Glucose, Bld: 124 mg/dL — ABNORMAL HIGH (ref 70–99)
HCT: 35 % — ABNORMAL LOW (ref 39.0–52.0)
Hemoglobin: 11.9 g/dL — ABNORMAL LOW (ref 13.0–17.0)
Potassium: 4.5 mEq/L (ref 3.5–5.1)

## 2011-09-12 LAB — LIPID PANEL: Total CHOL/HDL Ratio: 3.7 RATIO

## 2011-09-12 LAB — APTT: aPTT: 41 seconds — ABNORMAL HIGH (ref 24–37)

## 2011-09-12 LAB — PLATELET COUNT: Platelets: 97 10*3/uL — ABNORMAL LOW (ref 150–400)

## 2011-09-12 LAB — BASIC METABOLIC PANEL
BUN: 10 mg/dL (ref 6–23)
Calcium: 9.1 mg/dL (ref 8.4–10.5)
GFR calc non Af Amer: >90 mL/min (ref >90)
Glucose, Bld: 109 mg/dL — ABNORMAL HIGH (ref 70–99)
Sodium: 139 mEq/L (ref 135–145)

## 2011-09-12 LAB — MAGNESIUM: Magnesium: 3 mg/dL — ABNORMAL HIGH (ref 1.5–2.5)

## 2011-09-12 SURGERY — CORONARY ARTERY BYPASS GRAFTING (CABG)
Anesthesia: General | Site: Chest | Wound class: Clean

## 2011-09-12 MED ORDER — ROCURONIUM BROMIDE 100 MG/10ML IV SOLN
INTRAVENOUS | Status: DC | PRN
  Administered 2011-09-12 (×2): 50 mg via INTRAVENOUS

## 2011-09-12 MED ORDER — METOPROLOL TARTRATE 25 MG/10 ML ORAL SUSPENSION
12.5000 mg | Freq: Two times a day (BID) | ORAL | Status: DC
  Filled 2011-09-12 (×3): qty 5

## 2011-09-12 MED ORDER — KETOROLAC TROMETHAMINE 15 MG/ML IJ SOLN
15.0000 mg | Freq: Four times a day (QID) | INTRAMUSCULAR | Status: AC
  Administered 2011-09-12 – 2011-09-13 (×4): 15 mg via INTRAVENOUS
  Filled 2011-09-12 (×6): qty 1

## 2011-09-12 MED ORDER — SODIUM CHLORIDE 0.9 % IV SOLN
200.0000 ug | INTRAVENOUS | Status: DC | PRN
  Administered 2011-09-12: 0.2 ug/kg/h via INTRAVENOUS

## 2011-09-12 MED ORDER — SODIUM CHLORIDE 0.45 % IV SOLN
INTRAVENOUS | Status: DC
  Administered 2011-09-12: 20 mL/h via INTRAVENOUS

## 2011-09-12 MED ORDER — HEMOSTATIC AGENTS (NO CHARGE) OPTIME
TOPICAL | Status: DC | PRN
  Administered 2011-09-12: 3 via TOPICAL

## 2011-09-12 MED ORDER — MIDAZOLAM HCL 2 MG/2ML IJ SOLN: 2.0000 mg | INTRAMUSCULAR | Status: DC | PRN

## 2011-09-12 MED ORDER — MIDAZOLAM HCL 5 MG/5ML IJ SOLN
INTRAMUSCULAR | Status: DC | PRN
  Administered 2011-09-12 (×3): 2 mg via INTRAVENOUS
  Administered 2011-09-12: 4 mg via INTRAVENOUS
  Administered 2011-09-12: 2 mg via INTRAVENOUS
  Administered 2011-09-12: 6 mg via INTRAVENOUS
  Administered 2011-09-12: 2 mg via INTRAVENOUS

## 2011-09-12 MED ORDER — METOPROLOL TARTRATE 1 MG/ML IV SOLN: 2.5000 mg | INTRAVENOUS | Status: DC | PRN

## 2011-09-12 MED ORDER — OXYCODONE HCL 5 MG PO TABS
5.0000 mg | ORAL_TABLET | ORAL | Status: DC | PRN
  Administered 2011-09-12 – 2011-09-13 (×4): 10 mg via ORAL
  Administered 2011-09-13: 5 mg via ORAL
  Administered 2011-09-13 – 2011-09-14 (×5): 10 mg via ORAL
  Filled 2011-09-12 (×10): qty 2

## 2011-09-12 MED ORDER — PHENYLEPHRINE HCL 10 MG/ML IJ SOLN: 0.0000 ug/min | INTRAVENOUS | Status: DC

## 2011-09-12 MED ORDER — NITROGLYCERIN IN D5W 200-5 MCG/ML-% IV SOLN: 0.0000 ug/min | INTRAVENOUS | Status: DC

## 2011-09-12 MED ORDER — MORPHINE SULFATE 4 MG/ML IJ SOLN
2.0000 mg | INTRAMUSCULAR | Status: DC | PRN
  Administered 2011-09-13 – 2011-09-14 (×10): 4 mg via INTRAVENOUS
  Filled 2011-09-12 (×10): qty 1

## 2011-09-12 MED ORDER — ASPIRIN 81 MG PO CHEW: 324.0000 mg | CHEWABLE_TABLET | Freq: Every day | ORAL | Status: DC

## 2011-09-12 MED ORDER — DEXTROSE 5 % IV SOLN
1.5000 g | Freq: Two times a day (BID) | INTRAVENOUS | Status: DC
  Administered 2011-09-12 – 2011-09-14 (×3): 1.5 g via INTRAVENOUS
  Filled 2011-09-12 (×4): qty 1.5

## 2011-09-12 MED ORDER — PROTAMINE SULFATE 10 MG/ML IV SOLN
INTRAVENOUS | Status: DC | PRN
  Administered 2011-09-12: 200 mg via INTRAVENOUS

## 2011-09-12 MED ORDER — SODIUM CHLORIDE 0.9 % IV SOLN
100.0000 [IU] | INTRAVENOUS | Status: DC | PRN
  Administered 2011-09-12: 2.3 [IU]/h via INTRAVENOUS

## 2011-09-12 MED ORDER — DOPAMINE-DEXTROSE 3.2-5 MG/ML-% IV SOLN: 0.0000 ug/kg/min | INTRAVENOUS | Status: DC

## 2011-09-12 MED ORDER — SODIUM CHLORIDE 0.9 % IJ SOLN
3.0000 mL | Freq: Two times a day (BID) | INTRAMUSCULAR | Status: DC
  Administered 2011-09-13 – 2011-09-14 (×3): 3 mL via INTRAVENOUS

## 2011-09-12 MED ORDER — SODIUM CHLORIDE 0.9 % IV SOLN
0.1000 ug/kg/h | INTRAVENOUS | Status: DC
  Filled 2011-09-12: qty 2

## 2011-09-12 MED ORDER — INSULIN REGULAR BOLUS VIA INFUSION: 0.0000 [IU] | Freq: Three times a day (TID) | INTRAVENOUS | Status: DC

## 2011-09-12 MED ORDER — ALBUMIN HUMAN 5 % IV SOLN: 250.0000 mL | INTRAVENOUS | Status: AC | PRN

## 2011-09-12 MED ORDER — FAMOTIDINE IN NACL 20-0.9 MG/50ML-% IV SOLN
20.0000 mg | Freq: Two times a day (BID) | INTRAVENOUS | Status: DC
  Administered 2011-09-12: 20 mg via INTRAVENOUS

## 2011-09-12 MED ORDER — LACTATED RINGERS IV SOLN: 500.0000 mL | Freq: Once | INTRAVENOUS | Status: AC | PRN

## 2011-09-12 MED ORDER — SODIUM CHLORIDE 0.9 % IV SOLN
INTRAVENOUS | Status: DC | PRN
  Administered 2011-09-12: 12:00:00 via INTRAVENOUS

## 2011-09-12 MED ORDER — ALBUMIN HUMAN 5 % IV SOLN
INTRAVENOUS | Status: DC | PRN
  Administered 2011-09-12 (×2): via INTRAVENOUS

## 2011-09-12 MED ORDER — SODIUM CHLORIDE 0.9 % IV SOLN
INTRAVENOUS | Status: DC
  Administered 2011-09-12: 20 mL via INTRAVENOUS

## 2011-09-12 MED ORDER — ASPIRIN EC 325 MG PO TBEC
325.0000 mg | DELAYED_RELEASE_TABLET | Freq: Every day | ORAL | Status: DC
  Administered 2011-09-13 – 2011-09-17 (×5): 325 mg via ORAL
  Filled 2011-09-12 (×5): qty 1

## 2011-09-12 MED ORDER — METOPROLOL TARTRATE 12.5 MG HALF TABLET
12.5000 mg | ORAL_TABLET | Freq: Two times a day (BID) | ORAL | Status: DC
  Administered 2011-09-12: 12.5 mg via ORAL
  Filled 2011-09-12 (×3): qty 1

## 2011-09-12 MED ORDER — SODIUM CHLORIDE 0.9 % IJ SOLN
3.0000 mL | INTRAMUSCULAR | Status: DC | PRN
  Administered 2011-09-13: 3 mL via INTRAVENOUS

## 2011-09-12 MED ORDER — MAGNESIUM SULFATE 40 MG/ML IJ SOLN
4.0000 g | Freq: Once | INTRAMUSCULAR | Status: AC
  Administered 2011-09-12: 4 g via INTRAVENOUS

## 2011-09-12 MED ORDER — SODIUM CHLORIDE 0.9 % IV SOLN: 250.0000 mL | INTRAVENOUS | Status: DC

## 2011-09-12 MED ORDER — MAGNESIUM SULFATE 40 MG/ML IJ SOLN
INTRAMUSCULAR | Status: AC
  Administered 2011-09-12: 4 g via INTRAVENOUS
  Filled 2011-09-12: qty 100

## 2011-09-12 MED ORDER — METOPROLOL TARTRATE 12.5 MG HALF TABLET
12.5000 mg | ORAL_TABLET | Freq: Two times a day (BID) | ORAL | Status: DC
  Filled 2011-09-12: qty 1

## 2011-09-12 MED ORDER — POTASSIUM CHLORIDE 10 MEQ/50ML IV SOLN
10.0000 meq | INTRAVENOUS | Status: AC
  Administered 2011-09-12 (×3): 10 meq via INTRAVENOUS

## 2011-09-12 MED ORDER — VANCOMYCIN HCL 1000 MG IV SOLR
1000.0000 mg | Freq: Once | INTRAVENOUS | Status: AC
  Administered 2011-09-12: 1000 mg via INTRAVENOUS
  Filled 2011-09-12: qty 1000

## 2011-09-12 MED ORDER — ONDANSETRON HCL 4 MG/2ML IJ SOLN
4.0000 mg | Freq: Four times a day (QID) | INTRAMUSCULAR | Status: DC | PRN
  Administered 2011-09-12 – 2011-09-14 (×2): 4 mg via INTRAVENOUS
  Filled 2011-09-12 (×3): qty 2

## 2011-09-12 MED ORDER — DOPAMINE-DEXTROSE 1.6-5 MG/ML-% IV SOLN
INTRAVENOUS | Status: DC | PRN
  Administered 2011-09-12: 3 ug/kg/min via INTRAVENOUS

## 2011-09-12 MED ORDER — LACTATED RINGERS IV SOLN
INTRAVENOUS | Status: DC | PRN
  Administered 2011-09-12: 07:00:00 via INTRAVENOUS

## 2011-09-12 MED ORDER — SODIUM CHLORIDE 0.9 % IV SOLN
INTRAVENOUS | Status: DC
  Administered 2011-09-13: 1.9 [IU]/h via INTRAVENOUS
  Filled 2011-09-12: qty 1

## 2011-09-12 MED ORDER — BISACODYL 10 MG RE SUPP: 10.0000 mg | Freq: Every day | RECTAL | Status: DC

## 2011-09-12 MED ORDER — NITROGLYCERIN IN D5W 200-5 MCG/ML-% IV SOLN
INTRAVENOUS | Status: DC | PRN
  Administered 2011-09-12: 16.6 ug/min via INTRAVENOUS

## 2011-09-12 MED ORDER — SODIUM CHLORIDE 0.9 % IV SOLN
10.0000 g | INTRAVENOUS | Status: DC | PRN
  Administered 2011-09-12: 5 g/h via INTRAVENOUS

## 2011-09-12 MED ORDER — PANTOPRAZOLE SODIUM 40 MG PO TBEC
40.0000 mg | DELAYED_RELEASE_TABLET | Freq: Every day | ORAL | Status: DC
  Administered 2011-09-14 – 2011-09-17 (×4): 40 mg via ORAL
  Filled 2011-09-12 (×4): qty 1

## 2011-09-12 MED ORDER — VECURONIUM BROMIDE 10 MG IV SOLR
INTRAVENOUS | Status: DC | PRN
  Administered 2011-09-12: 4 mg via INTRAVENOUS
  Administered 2011-09-12: 6 mg via INTRAVENOUS

## 2011-09-12 MED ORDER — MORPHINE SULFATE 2 MG/ML IJ SOLN
1.0000 mg | INTRAMUSCULAR | Status: AC | PRN
  Administered 2011-09-12: 2 mg via INTRAVENOUS
  Filled 2011-09-12: qty 1

## 2011-09-12 MED ORDER — METOPROLOL TARTRATE 25 MG/10 ML ORAL SUSPENSION
12.5000 mg | Freq: Two times a day (BID) | ORAL | Status: DC
  Filled 2011-09-12: qty 5

## 2011-09-12 MED ORDER — HEMOSTATIC AGENTS (NO CHARGE) OPTIME
TOPICAL | Status: DC | PRN
  Administered 2011-09-12: 1 via TOPICAL

## 2011-09-12 MED ORDER — LACTATED RINGERS IV SOLN: INTRAVENOUS | Status: DC

## 2011-09-12 MED ORDER — DOCUSATE SODIUM 100 MG PO CAPS
200.0000 mg | ORAL_CAPSULE | Freq: Every day | ORAL | Status: DC
  Administered 2011-09-13 – 2011-09-17 (×5): 200 mg via ORAL
  Filled 2011-09-12 (×2): qty 2
  Filled 2011-09-12: qty 1
  Filled 2011-09-12 (×3): qty 2

## 2011-09-12 MED ORDER — 0.9 % SODIUM CHLORIDE (POUR BTL) OPTIME
TOPICAL | Status: DC | PRN
  Administered 2011-09-12: 6000 mL

## 2011-09-12 MED ORDER — BISACODYL 5 MG PO TBEC
10.0000 mg | DELAYED_RELEASE_TABLET | Freq: Every day | ORAL | Status: DC
  Administered 2011-09-13 – 2011-09-16 (×3): 10 mg via ORAL
  Filled 2011-09-12 (×3): qty 2

## 2011-09-12 MED ORDER — FENTANYL CITRATE 0.05 MG/ML IJ SOLN
INTRAMUSCULAR | Status: DC | PRN
  Administered 2011-09-12: 400 ug via INTRAVENOUS
  Administered 2011-09-12: 250 ug via INTRAVENOUS
  Administered 2011-09-12: 100 ug via INTRAVENOUS
  Administered 2011-09-12: 200 ug via INTRAVENOUS
  Administered 2011-09-12: 50 ug via INTRAVENOUS
  Administered 2011-09-12: 550 ug via INTRAVENOUS
  Administered 2011-09-12 (×2): 100 ug via INTRAVENOUS

## 2011-09-12 MED ORDER — PROPOFOL 10 MG/ML IV EMUL
INTRAVENOUS | Status: DC | PRN
  Administered 2011-09-12: 80 mg via INTRAVENOUS
  Administered 2011-09-12: 100 mg via INTRAVENOUS

## 2011-09-12 MED ORDER — PHENYLEPHRINE HCL 10 MG/ML IJ SOLN
20.0000 mg | INTRAVENOUS | Status: DC | PRN
  Administered 2011-09-12: 10 ug/min via INTRAVENOUS

## 2011-09-12 MED ORDER — HEPARIN SODIUM (PORCINE) 1000 UNIT/ML IJ SOLN
INTRAMUSCULAR | Status: DC | PRN
  Administered 2011-09-12: 19000 [IU] via INTRAVENOUS
  Administered 2011-09-12: 4000 [IU] via INTRAVENOUS
  Administered 2011-09-12: 2000 [IU] via INTRAVENOUS

## 2011-09-12 MED FILL — Nitroglycerin IV Soln 5 MG/ML: INTRAVENOUS | Qty: 10 | Status: AC

## 2011-09-12 MED FILL — Dexmedetomidine HCl IV Soln 200 MCG/2ML: INTRAVENOUS | Qty: 2 | Status: AC

## 2011-09-12 MED FILL — Verapamil HCl IV Soln 2.5 MG/ML: INTRAVENOUS | Qty: 4 | Status: AC

## 2011-09-12 MED FILL — Lactated Ringer's Solution: INTRAVENOUS | Qty: 500 | Status: AC

## 2011-09-12 MED FILL — Magnesium Sulfate Inj 50%: INTRAMUSCULAR | Qty: 10 | Status: AC

## 2011-09-12 MED FILL — Heparin Sodium (Porcine) Inj 1000 Unit/ML: INTRAMUSCULAR | Qty: 10 | Status: AC

## 2011-09-12 MED FILL — Potassium Chloride Inj 2 mEq/ML: INTRAVENOUS | Qty: 40 | Status: AC

## 2011-09-12 SURGICAL SUPPLY — 133 items
ADAPTER CARDIO PERF ANTE/RETRO (ADAPTER) ×3 IMPLANT
APPLIER CLIP 9.375 MED OPEN (MISCELLANEOUS)
APPLIER CLIP 9.375 SM OPEN (CLIP)
ATTRACTOMAT 16X20 MAGNETIC DRP (DRAPES) ×3 IMPLANT
BAG DECANTER FOR FLEXI CONT (MISCELLANEOUS) ×3 IMPLANT
BANDAGE ELASTIC 4 VELCRO ST LF (GAUZE/BANDAGES/DRESSINGS) ×3 IMPLANT
BANDAGE ELASTIC 6 VELCRO ST LF (GAUZE/BANDAGES/DRESSINGS) ×3 IMPLANT
BANDAGE GAUZE ELAST BULKY 4 IN (GAUZE/BANDAGES/DRESSINGS) ×3 IMPLANT
BASKET HEART  (ORDER IN 25'S) (MISCELLANEOUS) ×1
BASKET HEART (ORDER IN 25'S) (MISCELLANEOUS) ×1
BASKET HEART (ORDER IN 25S) (MISCELLANEOUS) ×1 IMPLANT
BLADE SAW STERNAL (BLADE) ×3 IMPLANT
BLADE SURG 11 STRL SS (BLADE) ×3 IMPLANT
BLADE SURG 12 STRL SS (BLADE) ×3 IMPLANT
BLADE SURG ROTATE 9660 (MISCELLANEOUS) IMPLANT
CANISTER SUCTION 2500CC (MISCELLANEOUS) ×3 IMPLANT
CANNULA GUNDRY RCSP 15FR (MISCELLANEOUS) ×3 IMPLANT
CATH CPB KIT VANTRIGT (MISCELLANEOUS) ×3 IMPLANT
CATH ROBINSON RED A/P 18FR (CATHETERS) ×9 IMPLANT
CATH THORACIC 28FR (CATHETERS) IMPLANT
CATH THORACIC 28FR RT ANG (CATHETERS) IMPLANT
CATH THORACIC 36FR (CATHETERS) IMPLANT
CATH THORACIC 36FR RT ANG (CATHETERS) ×6 IMPLANT
CLIP APPLIE 9.375 MED OPEN (MISCELLANEOUS) IMPLANT
CLIP APPLIE 9.375 SM OPEN (CLIP) IMPLANT
CLIP FOGARTY SPRING 6M (CLIP) ×3 IMPLANT
CLIP RETRACTION 3.0MM CORONARY (MISCELLANEOUS) ×3 IMPLANT
CLIP TI MEDIUM 24 (CLIP) IMPLANT
CLIP TI WIDE RED SMALL 24 (CLIP) ×6 IMPLANT
CLOTH BEACON ORANGE TIMEOUT ST (SAFETY) ×3 IMPLANT
CONN Y 3/8X3/8X3/8  BEN (MISCELLANEOUS)
CONN Y 3/8X3/8X3/8 BEN (MISCELLANEOUS) IMPLANT
COVER SURGICAL LIGHT HANDLE (MISCELLANEOUS) ×6 IMPLANT
CRADLE DONUT ADULT HEAD (MISCELLANEOUS) ×3 IMPLANT
DERMABOND ADVANCED (GAUZE/BANDAGES/DRESSINGS) ×4
DERMABOND ADVANCED .7 DNX12 (GAUZE/BANDAGES/DRESSINGS) ×2 IMPLANT
DRAIN CHANNEL 32F RND 10.7 FF (WOUND CARE) ×3 IMPLANT
DRAPE CARDIOVASCULAR INCISE (DRAPES) ×2
DRAPE SLUSH MACHINE 52X66 (DRAPES) IMPLANT
DRAPE SLUSH/WARMER DISC (DRAPES) IMPLANT
DRAPE SRG 135X102X78XABS (DRAPES) ×1 IMPLANT
DRSG COVADERM 4X14 (GAUZE/BANDAGES/DRESSINGS) ×3 IMPLANT
ELECT BLADE 4.0 EZ CLEAN MEGAD (MISCELLANEOUS) ×3
ELECT BLADE 6.5 EXT (BLADE) ×3 IMPLANT
ELECT CAUTERY BLADE 6.4 (BLADE) ×3 IMPLANT
ELECT REM PT RETURN 9FT ADLT (ELECTROSURGICAL) ×6
ELECTRODE BLDE 4.0 EZ CLN MEGD (MISCELLANEOUS) ×1 IMPLANT
ELECTRODE REM PT RTRN 9FT ADLT (ELECTROSURGICAL) ×2 IMPLANT
GLOVE BIO SURGEON STRL SZ 6 (GLOVE) IMPLANT
GLOVE BIO SURGEON STRL SZ 6.5 (GLOVE) ×10 IMPLANT
GLOVE BIO SURGEON STRL SZ7 (GLOVE) IMPLANT
GLOVE BIO SURGEON STRL SZ7.5 (GLOVE) ×12 IMPLANT
GLOVE BIO SURGEON STRL SZ8 (GLOVE) ×3 IMPLANT
GLOVE BIO SURGEONS STRL SZ 6.5 (GLOVE) ×5
GLOVE BIOGEL PI IND STRL 6 (GLOVE) ×4 IMPLANT
GLOVE BIOGEL PI IND STRL 6.5 (GLOVE) IMPLANT
GLOVE BIOGEL PI IND STRL 7.0 (GLOVE) ×4 IMPLANT
GLOVE BIOGEL PI INDICATOR 6 (GLOVE) ×8
GLOVE BIOGEL PI INDICATOR 6.5 (GLOVE)
GLOVE BIOGEL PI INDICATOR 7.0 (GLOVE) ×8
GLOVE EUDERMIC 7 POWDERFREE (GLOVE) IMPLANT
GLOVE ORTHO TXT STRL SZ7.5 (GLOVE) ×6 IMPLANT
GOWN STRL NON-REIN LRG LVL3 (GOWN DISPOSABLE) ×24 IMPLANT
GOWN STRL REIN XL XLG (GOWN DISPOSABLE) ×6 IMPLANT
HEMOSTAT POWDER SURGIFOAM 1G (HEMOSTASIS) ×9 IMPLANT
HEMOSTAT SURGICEL 2X14 (HEMOSTASIS) ×3 IMPLANT
INSERT FOGARTY 61MM (MISCELLANEOUS) IMPLANT
INSERT FOGARTY XLG (MISCELLANEOUS) IMPLANT
KIT BASIN OR (CUSTOM PROCEDURE TRAY) ×3 IMPLANT
KIT ROOM TURNOVER OR (KITS) ×3 IMPLANT
KIT SUCTION CATH 14FR (SUCTIONS) ×3 IMPLANT
KIT VASOVIEW W/TROCAR VH 2000 (KITS) ×3 IMPLANT
LEAD PACING MYOCARDI (MISCELLANEOUS) ×3 IMPLANT
MARKER GRAFT CORONARY BYPASS (MISCELLANEOUS) ×9 IMPLANT
MATRIX HEMOSTAT SURGIFLO (HEMOSTASIS) ×3 IMPLANT
NS IRRIG 1000ML POUR BTL (IV SOLUTION) ×15 IMPLANT
PACK OPEN HEART (CUSTOM PROCEDURE TRAY) ×3 IMPLANT
PAD ARMBOARD 7.5X6 YLW CONV (MISCELLANEOUS) ×6 IMPLANT
PENCIL BUTTON HOLSTER BLD 10FT (ELECTRODE) ×3 IMPLANT
PUNCH AORTIC ROTATE 4.0MM (MISCELLANEOUS) IMPLANT
PUNCH AORTIC ROTATE 4.5MM 8IN (MISCELLANEOUS) ×3 IMPLANT
PUNCH AORTIC ROTATE 5MM 8IN (MISCELLANEOUS) IMPLANT
SET CARDIOPLEGIA MPS 5001102 (MISCELLANEOUS) ×3 IMPLANT
SOLUTION ANTI FOG 6CC (MISCELLANEOUS) IMPLANT
SPONGE GAUZE 4X4 12PLY (GAUZE/BANDAGES/DRESSINGS) ×6 IMPLANT
SPONGE INTESTINAL PEANUT (DISPOSABLE) IMPLANT
SPONGE LAP 18X18 X RAY DECT (DISPOSABLE) IMPLANT
SPONGE LAP 4X18 X RAY DECT (DISPOSABLE) IMPLANT
SURGIFLO W/THROMBIN 8M KIT (HEMOSTASIS) ×3 IMPLANT
SUT BONE WAX W31G (SUTURE) ×3 IMPLANT
SUT MNCRL AB 4-0 PS2 18 (SUTURE) ×3 IMPLANT
SUT PROLENE 3 0 SH DA (SUTURE) ×3 IMPLANT
SUT PROLENE 3 0 SH1 36 (SUTURE) IMPLANT
SUT PROLENE 4 0 RB 1 (SUTURE) ×2
SUT PROLENE 4 0 SH DA (SUTURE) ×3 IMPLANT
SUT PROLENE 4-0 RB1 .5 CRCL 36 (SUTURE) ×1 IMPLANT
SUT PROLENE 5 0 C 1 36 (SUTURE) IMPLANT
SUT PROLENE 6 0 C 1 30 (SUTURE) ×3 IMPLANT
SUT PROLENE 6 0 CC (SUTURE) ×9 IMPLANT
SUT PROLENE 7 0 BV 1 (SUTURE) IMPLANT
SUT PROLENE 7 0 BV1 MDA (SUTURE) IMPLANT
SUT PROLENE 7 0 DA (SUTURE) IMPLANT
SUT PROLENE 7.0 RB 3 (SUTURE) ×9 IMPLANT
SUT PROLENE 8 0 BV175 6 (SUTURE) ×3 IMPLANT
SUT PROLENE BLUE 7 0 (SUTURE) ×6 IMPLANT
SUT PROLENE POLY MONO (SUTURE) ×12 IMPLANT
SUT SILK  1 MH (SUTURE)
SUT SILK 1 MH (SUTURE) IMPLANT
SUT SILK 2 0 SH CR/8 (SUTURE) IMPLANT
SUT SILK 2 0SH CR/8 30 (SUTURE) ×3 IMPLANT
SUT SILK 3 0 SH CR/8 (SUTURE) IMPLANT
SUT STEEL 6MS V (SUTURE) ×6 IMPLANT
SUT STEEL STERNAL CCS#1 18IN (SUTURE) IMPLANT
SUT STEEL SZ 6 DBL 3X14 BALL (SUTURE) ×3 IMPLANT
SUT VIC AB 1 CTX 18 (SUTURE) IMPLANT
SUT VIC AB 1 CTX 36 (SUTURE) ×4
SUT VIC AB 1 CTX36XBRD ANBCTR (SUTURE) ×2 IMPLANT
SUT VIC AB 2-0 CT1 27 (SUTURE) ×2
SUT VIC AB 2-0 CT1 TAPERPNT 27 (SUTURE) ×1 IMPLANT
SUT VIC AB 2-0 CTX 27 (SUTURE) IMPLANT
SUT VIC AB 3-0 SH 27 (SUTURE)
SUT VIC AB 3-0 SH 27X BRD (SUTURE) IMPLANT
SUT VIC AB 3-0 X1 27 (SUTURE) IMPLANT
SUT VICRYL 4-0 PS2 18IN ABS (SUTURE) IMPLANT
SUTURE E-PAK OPEN HEART (SUTURE) ×3 IMPLANT
SYSTEM SAHARA CHEST DRAIN ATS (WOUND CARE) ×3 IMPLANT
TAPE CLOTH SURG 4X10 WHT LF (GAUZE/BANDAGES/DRESSINGS) ×3 IMPLANT
TOWEL OR 17X24 6PK STRL BLUE (TOWEL DISPOSABLE) ×6 IMPLANT
TOWEL OR 17X26 10 PK STRL BLUE (TOWEL DISPOSABLE) ×6 IMPLANT
TRAY FOLEY IC TEMP SENS 16FR (CATHETERS) ×3 IMPLANT
TUBING INSUFFLATION 10FT LAP (TUBING) ×3 IMPLANT
UNDERPAD 30X30 INCONTINENT (UNDERPADS AND DIAPERS) ×3 IMPLANT
WATER STERILE IRR 1000ML POUR (IV SOLUTION) ×6 IMPLANT

## 2011-09-12 NOTE — Preoperative (Signed)
Beta Blockers   Reason not to administer Beta Blockers:Not Applicable 

## 2011-09-12 NOTE — OR Nursing (Signed)
Timeout performed at 0820. Leg incision made at 0830. Chest incision made at 0835.

## 2011-09-12 NOTE — Procedures (Signed)
Extubation Procedure Note  Patient Details:   Name: Manuel Bell DOB: 03-18-1956 MRN: [id number]   Airway Documentation:     Evaluation  O2 sats: stable throughout and currently acceptable Complications: No apparent complications Patient  tolerate procedure well. Bilateral Breath Sounds: Clear   Pt awake and alert, extubated per protocol, placed on 3L [place], sat 96%. Positive cuff leak, NIF -32, VC 850,  IS 1000. Pt able to vocalize, Bloody secretions. BBS cl.  Brynn, Reznik 09/12/2011, 6:24 PM

## 2011-09-12 NOTE — Progress Notes (Signed)
The patient was examined and preop studies reviewed. There has been no change from the prior exam and the patient is ready for surgery.  Plan multivessel CABG this am. 

## 2011-09-12 NOTE — Progress Notes (Signed)
Patient ID: Manuel Bell, male   DOB: [birth year], 56 y.o.   MRN: [id number]  Filed Vitals:   09/12/11 1845 09/12/11 1900 09/12/11 1915 09/12/11 1930  BP:  110/73    Pulse: 91 93 98 94  Temp: 100.6 F (38.1 C) 100.4 F (38 C) 100.4 F (38 C) 100.4 F (38 C)  TempSrc:      Resp: 24 18 16 15   Height:      Weight:      SpO2: 97% 97% 97% 98%   CI=2.6 on no drips Extubated and alert Urine output ok CT output low  CBC    Component Value Date/Time   WBC 7.2 09/12/2011 1400   RBC 3.16* 09/12/2011 1400   HGB 10.5* 09/12/2011 1403   HCT 31.0* 09/12/2011 1403   PLT 66* 09/12/2011 1400   MCV 93.4 09/12/2011 1400   MCH 33.2 09/12/2011 1400   MCHC 35.6 09/12/2011 1400   RDW 12.3 09/12/2011 1400   LYMPHSABS 1.3 09/11/2011 1117   MONOABS 0.3 09/11/2011 1117   EOSABS 0.4 09/11/2011 1117   BASOSABS 0.0 09/11/2011 1117    BMET    Component Value Date/Time   NA 143 09/12/2011 1403   K 3.3* 09/12/2011 1403   CL 101 09/12/2011 0538   CO2 28 09/12/2011 0538   GLUCOSE 114* 09/12/2011 1403   BUN 10 09/12/2011 0538   CREATININE 0.70 09/12/2011 0538   CALCIUM 9.1 09/12/2011 0538   GFRNONAA >90 09/12/2011 0538   GFRAA >90 09/12/2011 3086

## 2011-09-12 NOTE — Transfer of Care (Signed)
Immediate Anesthesia Transfer of Care Note  Patient: Manuel Bell  Procedure(s) Performed: Procedure(s) (LRB): CORONARY ARTERY BYPASS GRAFTING (CABG) (N/A)  Patient Location: PACU and SICU  Anesthesia Type: General  Level of Consciousness: sedated and Patient remains intubated per anesthesia plan  Airway & Oxygen Therapy: Patient remains intubated per anesthesia plan  Post-op Assessment: Report given to SICU RN and Post -op Vital signs reviewed and stable  Post vital signs: Reviewed and stable  Complications: No apparent anesthesia complications

## 2011-09-12 NOTE — Anesthesia Postprocedure Evaluation (Signed)
  Anesthesia Post-op Note  Patient: Manuel Bell  Procedure(s) Performed: Procedure(s) (LRB): CORONARY ARTERY BYPASS GRAFTING (CABG) (N/A)  Patient Location: PACU and SICU  Anesthesia Type: General  Level of Consciousness: sedated and Patient remains intubated per anesthesia plan  Airway and Oxygen Therapy: Patient remains intubated per anesthesia plan  Post-op Pain: sedated  Post-op Assessment: Post-op Vital signs reviewed, Patient's Cardiovascular Status Stable and Respiratory Function Stable  Post-op Vital Signs: Reviewed and stable  Complications: No apparent anesthesia complications

## 2011-09-12 NOTE — Brief Op Note (Signed)
09/12/2011        11:51 AM  PATIENT:  Manuel Bell  56 y.o. male  PRE-OPERATIVE DIAGNOSIS:  CORONARY ARTERY DISEASE  POST-OPERATIVE DIAGNOSIS:  CORONARY ARTERY DISEASE  PROCEDURE:  CORONARY ARTERY BYPASS GRAFTING (CABG) x5:(LIMA to LAD, SVG to Diagonal 1, SVG sequentially to OM1 and distal Circumflex, and SVG to PDA) with EVH from the right thigh and calf.  SURGEON:  Surgeon(s) and Role:     Kerin Perna, MD - Primary  PHYSICIAN ASSISTANT: Doree Fudge PA-C  ASSISTANTS: Teryl Lucy RNFA  ANESTHESIA:   general  EBL:  Total I/O In: 2100 [I.V.:2100] Out: 1100 [Urine:1100]  BLOOD ADMINISTERED:1 FFP and 1 Platelet  DRAINS:  Chest Tube(s) in the mediastinal and pleural spaces   COUNTS CORRECT:  YES   DICTATION: .Dragon Dictation  PLAN OF CARE: Admit to inpatient   PATIENT DISPOSITION:  ICU - intubated and hemodynamically stable.   Delay start of Pharmacological VTE agent (>24hrs) due to surgical blood loss or risk of bleeding: YES  PRE OP WEIGHT: 84 kg

## 2011-09-12 NOTE — OR Nursing (Signed)
Made call to volunteer desk to inform family patient is off pump at 1233. Made first call to 2300 SICU at 1233. Made second call to 2300 SICU at 1303.

## 2011-09-12 NOTE — Plan of Care (Signed)
Problem: Phase I Progression Outcomes Goal: Vascular site scale level 0 - I Vascular Site Scale Level 0: No bruising/bleeding/hematoma Level I (Mild): Bruising/Ecchymosis, minimal bleeding/ooozing, palpable hematoma < 3 cm Level II (Moderate): Bleeding not affecting hemodynamic parameters, pseudoaneurysm, palpable hematoma > 3 cm  Outcome: Completed/Met Date Met:  09/12/11 Right groin, level I

## 2011-09-12 NOTE — Anesthesia Preprocedure Evaluation (Addendum)
Anesthesia Evaluation  Patient identified by MRN, date of birth, ID band Patient awake    Reviewed: Allergy & Precautions, H&P , NPO status , Patient's Chart, lab work & pertinent test results, reviewed documented beta blocker date and time   Airway Mallampati: I TM Distance: >3 FB Neck ROM: Full    Dental   Pulmonary former smoker         Cardiovascular hypertension, Pt. on home beta blockers + angina with exertion + CAD     Neuro/Psych    GI/Hepatic GERD-  Controlled,(+) Hepatitis -, C  Endo/Other    Renal/GU      Musculoskeletal   Abdominal   Peds  Hematology   Anesthesia Other Findings   Reproductive/Obstetrics                          Anesthesia Physical Anesthesia Plan  ASA: III  Anesthesia Plan: General   Post-op Pain Management:    Induction: Intravenous  Airway Management Planned: Oral ETT  Additional Equipment: Arterial line, PA Cath, TEE and Ultrasound Guidance Line Placement  Intra-op Plan:   Post-operative Plan: Post-operative intubation/ventilation  Informed Consent: I have reviewed the patients History and Physical, chart, labs and discussed the procedure including the risks, benefits and alternatives for the proposed anesthesia with the patient or authorized representative who has indicated his/her understanding and acceptance.     Plan Discussed with: CRNA and Surgeon  Anesthesia Plan Comments:         Anesthesia Quick Evaluation

## 2011-09-12 NOTE — Significant Event (Signed)
Pt had blood secretions when orally suctioned immediately after extubation, with a small clot suctioned out. Mouth care performed; I/S performed by patient, 500-750. Pt GSC=15. VS stable. MD Donata Clay made aware this evening. Will monitor closely. Tyus Kallam, Charity fundraiser.

## 2011-09-13 ENCOUNTER — Inpatient Hospital Stay (HOSPITAL_COMMUNITY): Payer: BC Managed Care – PPO

## 2011-09-13 ENCOUNTER — Encounter (HOSPITAL_COMMUNITY): Payer: Self-pay | Admitting: Cardiothoracic Surgery

## 2011-09-13 LAB — GLUCOSE, CAPILLARY
Glucose-Capillary: 110 mg/dL — ABNORMAL HIGH (ref 70–99)
Glucose-Capillary: 114 mg/dL — ABNORMAL HIGH (ref 70–99)
Glucose-Capillary: 118 mg/dL — ABNORMAL HIGH (ref 70–99)
Glucose-Capillary: 119 mg/dL — ABNORMAL HIGH (ref 70–99)
Glucose-Capillary: 119 mg/dL — ABNORMAL HIGH (ref 70–99)
Glucose-Capillary: 160 mg/dL — ABNORMAL HIGH (ref 70–99)
Glucose-Capillary: 176 mg/dL — ABNORMAL HIGH (ref 70–99)
Glucose-Capillary: 89 mg/dL (ref 70–99)

## 2011-09-13 LAB — CREATININE, SERUM
Creatinine, Ser: 0.63 mg/dL (ref 0.50–1.35)
GFR calc non Af Amer: >90 mL/min (ref >90)

## 2011-09-13 LAB — CBC
Hemoglobin: 12.1 g/dL — ABNORMAL LOW (ref 13.0–17.0)
MCH: 33.2 pg (ref 26.0–34.0)
MCH: 32.8 pg (ref 26.0–34.0)
MCHC: 35.2 g/dL (ref 30.0–36.0)
MCHC: 34.1 g/dL (ref 30.0–36.0)
MCV: 93.4 fL (ref 78.0–100.0)
Platelets: 66 10*3/uL — ABNORMAL LOW (ref 150–400)
Platelets: 103 10*3/uL — ABNORMAL LOW (ref 150–400)
Platelets: 110 10*3/uL — ABNORMAL LOW (ref 150–400)
RBC: 3.55 MIL/uL — ABNORMAL LOW (ref 4.22–5.81)
RDW: 12.3 % (ref 11.5–15.5)
RDW: 12.6 % (ref 11.5–15.5)
WBC: 7.2 10*3/uL (ref 4.0–10.5)

## 2011-09-13 LAB — POCT I-STAT, CHEM 8
Calcium, Ion: 1.13 mmol/L (ref 1.12–1.32)
Chloride: 99 mEq/L (ref 96–112)
HCT: 36 % — ABNORMAL LOW (ref 39.0–52.0)
Sodium: 135 mEq/L (ref 135–145)
TCO2: 27 mmol/L (ref 0–100)

## 2011-09-13 LAB — BASIC METABOLIC PANEL
CO2: 24 mEq/L (ref 19–32)
Calcium: 7.7 mg/dL — ABNORMAL LOW (ref 8.4–10.5)
GFR calc Af Amer: >90 mL/min (ref >90)
GFR calc non Af Amer: >90 mL/min (ref >90)
Sodium: 137 mEq/L (ref 135–145)

## 2011-09-13 LAB — PREPARE PLATELET PHERESIS

## 2011-09-13 LAB — MAGNESIUM
Magnesium: 2.7 mg/dL — ABNORMAL HIGH (ref 1.5–2.5)
Magnesium: 2.5 mg/dL (ref 1.5–2.5)

## 2011-09-13 LAB — PREPARE FRESH FROZEN PLASMA: Unit division: 0

## 2011-09-13 MED ORDER — POTASSIUM CHLORIDE CRYS ER 20 MEQ PO TBCR
20.0000 meq | EXTENDED_RELEASE_TABLET | Freq: Once | ORAL | Status: AC
  Administered 2011-09-13: 20 meq via ORAL
  Filled 2011-09-13: qty 1

## 2011-09-13 MED ORDER — FUROSEMIDE 10 MG/ML IJ SOLN
20.0000 mg | Freq: Once | INTRAMUSCULAR | Status: AC
  Administered 2011-09-13: 10:00:00 via INTRAVENOUS
  Filled 2011-09-13: qty 2

## 2011-09-13 MED ORDER — METOPROLOL TARTRATE 25 MG/10 ML ORAL SUSPENSION
25.0000 mg | Freq: Two times a day (BID) | ORAL | Status: DC
  Filled 2011-09-13 (×4): qty 10

## 2011-09-13 MED ORDER — LISINOPRIL 5 MG PO TABS
5.0000 mg | ORAL_TABLET | Freq: Every day | ORAL | Status: DC
  Administered 2011-09-14: 5 mg via ORAL
  Filled 2011-09-13 (×2): qty 1

## 2011-09-13 MED ORDER — INSULIN ASPART 100 UNIT/ML ~~LOC~~ SOLN
0.0000 [IU] | SUBCUTANEOUS | Status: DC
  Administered 2011-09-13: 4 [IU] via SUBCUTANEOUS
  Administered 2011-09-13 – 2011-09-14 (×4): 2 [IU] via SUBCUTANEOUS

## 2011-09-13 MED ORDER — INSULIN ASPART 100 UNIT/ML ~~LOC~~ SOLN: 0.0000 [IU] | SUBCUTANEOUS | Status: DC

## 2011-09-13 MED ORDER — METOPROLOL TARTRATE 25 MG PO TABS
25.0000 mg | ORAL_TABLET | Freq: Two times a day (BID) | ORAL | Status: DC
  Administered 2011-09-13 – 2011-09-14 (×3): 25 mg via ORAL
  Filled 2011-09-13 (×4): qty 1

## 2011-09-13 MED ORDER — INSULIN ASPART 100 UNIT/ML ~~LOC~~ SOLN
0.0000 [IU] | SUBCUTANEOUS | Status: AC
  Administered 2011-09-13 (×2): 2 [IU] via SUBCUTANEOUS

## 2011-09-13 NOTE — Progress Notes (Addendum)
1 Day Post-Op Procedure(s) (LRB): CORONARY ARTERY BYPASS GRAFTING (CABG) (N/A)  Subjective: Patient with some minor incisional pain.  Objective: Vital signs in last 24 hours: Patient Vitals for the past 24 hrs:  BP Temp Temp src Pulse Resp SpO2 Height Weight  09/13/11 0645 - 100 F (37.8 C) - 90  15  97 % - -  09/13/11 0630 - 100 F (37.8 C) - 94  17  96 % - -  09/13/11 0615 - 100.2 F (37.9 C) - 91  13  96 % - -  09/13/11 0600 112/75 mmHg 100.2 F (37.9 C) Core 101  16  96 % - 198 lb 3.1 oz (89.9 kg)  09/13/11 0545 - 100.2 F (37.9 C) - 103  17  97 % - -  09/13/11 0530 - 100.4 F (38 C) - 96  14  96 % - -  09/13/11 0515 - 100.8 F (38.2 C) - 95  14  96 % - -  09/13/11 0500 110/68 mmHg 100.8 F (38.2 C) Core 101  18  96 % - -  09/13/11 0445 - 100.8 F (38.2 C) - 94  13  96 % - -  09/13/11 0430 - 100.4 F (38 C) - 98  17  96 % - -  09/13/11 0415 - 100.2 F (37.9 C) - 99  18  97 % - -  09/13/11 0400 - 100.2 F (37.9 C) Core 97  18  98 % - -  09/13/11 0345 - 100.2 F (37.9 C) - 91  12  97 % - -  09/13/11 0330 - 100 F (37.8 C) - 91  13  97 % - -  09/13/11 0315 - 99.9 F (37.7 C) - 91  16  96 % - -  09/13/11 0300 - 99.3 F (37.4 C) Core 95  18  98 % - -  09/13/11 0245 - 99.3 F (37.4 C) - 89  10  98 % - -  09/13/11 0230 - 99.1 F (37.3 C) - 89  11  98 % - -  09/13/11 0215 - 99.1 F (37.3 C) - 90  13  98 % - -  09/13/11 0200 - 99 F (37.2 C) Core 88  11  98 % - -  09/13/11 0145 - 99.1 F (37.3 C) - 90  12  98 % - -  09/13/11 0130 - 99.1 F (37.3 C) - 89  11  98 % - -  09/13/11 0115 - 99.1 F (37.3 C) - 89  10  98 % - -  09/13/11 0100 - 99.1 F (37.3 C) Core 95  19  98 % - -  09/13/11 0045 - 99.5 F (37.5 C) - 92  13  98 % - -  09/13/11 0030 - 99.5 F (37.5 C) - 93  17  98 % - -  09/13/11 0015 - 99.7 F (37.6 C) - 92  11  97 % - -  09/13/11 0000 - 99.9 F (37.7 C) Core 95  15  95 % - -  09/12/11 2345 - 99.9 F (37.7 C) - 94  12  98 % - -  09/12/11 2330 -  99.7 F (37.6 C) - 95  12  99 % - -  09/12/11 2315 - 99.7 F (37.6 C) Core 96  16  99 % - -  09/12/11 2300 109/75 mmHg 99.7 F (37.6 C) - 96  13  98 % - -  09/12/11 2245 -  99.7 F (37.6 C) - 96  12  98 % - -  09/12/11 2230 - 99.9 F (37.7 C) - 96  14  98 % - -  09/12/11 2215 - 99.7 F (37.6 C) - 100  18  100 % - -  09/12/11 2200 112/98 mmHg 99.7 F (37.6 C) Core 101  21  99 % - -  09/12/11 2145 - 99.7 F (37.6 C) - 95  13  98 % - -  09/12/11 2130 - 99.7 F (37.6 C) - 96  13  98 % - -  09/12/11 2115 - 99.7 F (37.6 C) - 95  13  98 % - -  09/12/11 2100 104/74 mmHg 99.7 F (37.6 C) - 95  13  98 % - -  09/12/11 2045 - 99.7 F (37.6 C) - 97  13  97 % - -  09/12/11 2030 - 99.9 F (37.7 C) - 94  11  97 % - -  09/12/11 2015 - 100 F (37.8 C) - 99  18  97 % - -  09/12/11 2000 103/72 mmHg 100 F (37.8 C) Core 100  17  98 % - -  09/12/11 1945 - 100.2 F (37.9 C) - 98  17  98 % - -  09/12/11 1930 - 100.4 F (38 C) - 94  15  98 % - -  09/12/11 1915 - 100.4 F (38 C) - 98  16  97 % - -  09/12/11 1900 110/73 mmHg 100.4 F (38 C) - 93  18  97 % - -  09/12/11 1845 - 100.6 F (38.1 C) - 91  24  97 % - -  09/12/11 1833 - - - - 20  97 % - -  09/12/11 1831 - 100.4 F (38 C) - 89  18  96 % - -  09/12/11 1830 - 100.4 F (38 C) - 89  19  95 % - -  09/12/11 1815 - 100.4 F (38 C) - 92  16  97 % - -  09/12/11 1810 - 100.4 F (38 C) - 91  15  96 % - -  09/12/11 1800 97/70 mmHg - - 92  15  99 % - -  09/12/11 1745 - - - 93  15  99 % - -  09/12/11 1730 - - - 93  16  99 % - -  09/12/11 1715 - - - 91  19  99 % - -  09/12/11 1710 - - - 88  18  99 % - -  09/12/11 1700 - 99.7 F (37.6 C) - 90  15  99 % - -  09/12/11 1645 - 99.5 F (37.5 C) - 89  12  100 % - -  09/12/11 1635 - - - 89  13  100 % - -  09/12/11 1630 - 99 F (37.2 C) - 88  12  100 % - -  09/12/11 1615 - 98.8 F (37.1 C) - 88  13  100 % - -  09/12/11 1600 - 98.2 F (36.8 C) - 88  15  100 % - -  09/12/11 1551 - - - - - - 5'  10" (1.778 m) 183 lb 13.8 oz (83.4 kg)  09/12/11 1545 - 97.9 F (36.6 C) - 87  12  100 % - -  09/12/11 1530 - 97.3 F (36.3 C) - 88  12  100 % - -  09/12/11 1515 - 96.8 F (  36 C) - 87  12  100 % - -  09/12/11 1500 - 96.1 F (35.6 C) - 86  12  99 % - -  09/12/11 1445 - 95.5 F (35.3 C) - 84  12  98 % - -  09/12/11 1430 - 95 F (35 C) - 83  12  98 % - -  09/12/11 1415 - 94.6 F (34.8 C) - 78  12  99 % - -  09/12/11 1400 - 94.3 F (34.6 C) - 76  17  97 % - -  09/12/11 1345 135/71 mmHg - - 76  13  97 % - -   Pre op weight  84 kg Current Weight  09/13/11 198 lb 3.1 oz (89.9 kg)    Hemodynamic parameters for last 24 hours: PAP: (19-45)/(6-25) 23/12 mmHg CO:  [3.9 L/min-6.2 L/min] 6 L/min CI:  [2 L/min/m2-3.1 L/min/m2] 3 L/min/m2  Intake/Output from previous day: 03/21 0701 - 03/22 0700 In: 6445.5 [P.O.:550; I.V.:3843.5; Blood:1052; NG/GT:30; IV Piggyback:970] Out: 5330 [Urine:3690; Emesis/NG output:100; Blood:750; Chest Tube:790]   Physical Exam:  Cardiovascular: RRR, no murmurs, gallops, or rubs. Pulmonary: Slightly diminished at the bases; no rales, wheezes, or rhonchi. Abdomen: Soft, non tender, bowel sounds present. Extremities: SCD on LLE. Mild edema RLE. Wounds: Dressings clean and dry.    Lab Results: CBC: Basename 09/13/11 0308 09/12/11 2006 09/12/11 2000  WBC 9.4 -- 9.3  HGB 11.8* 11.9* --  HCT 33.5* 35.0* --  PLT 103* -- 97*   BMET:  Basename 09/13/11 0308 09/12/11 2006 09/12/11 0538  NA 137 142 --  K 4.5 4.5 --  CL 106 106 --  CO2 24 -- 28  GLUCOSE 106* 124* --  BUN 9 7 --  CREATININE 0.56 0.60 --  CALCIUM 7.7* -- 9.1    PT/INR:  Basename 09/12/11 1400  LABPROT 17.6*  INR 1.42   ABG:  INR: Will add last result for INR, ABG once components are confirmed Will add last 4 CBG results once components are confirmed  Assessment/Plan:  1. CV - SR. On Nitro this am.Continue Lopressor 12.5 bid.Will restart low dose Lisinopril for bp. 2.  Pulmonary  - Encourage incentive spirometer.CXR this am shows bibasilar atelectasis, no pneumothorax, and trace bilateral pleural effusions.Chest tubes with 300 cc of output last shift. 3. Volume Overload - Begin diuresis. 4.  Acute blood loss anemia - H/H this am 11.8/33.5. 5.Thrombocytopenia-Platelets up to 103,000. 6.Pre op HGA1C 5.5. Wean off insulin drip. 7.Remove Swan, aline. Probable remove cts later today if output is decreasing.   ZIMMERMAN,DONIELLE MPA-C 09/13/2011   I have seen and examined the patient and agree with the assessment and plan as outlined.  Will leave chest tubes until this afternoon.  Mobilize. Diuresis. Increase Lopressor.  Jodine Muchmore H 09/13/2011 8:49 AM

## 2011-09-13 NOTE — Anesthesia Postprocedure Evaluation (Signed)
Anesthesia Post Note  Patient: Manuel Bell  Procedure(s) Performed: Procedure(s) (LRB): CORONARY ARTERY BYPASS GRAFTING (CABG) (N/A)  Anesthesia type: General  Patient location: ICU  Post pain: Pain level controlled  Post assessment: Post-op Vital signs reviewed  Last Vitals:  Filed Vitals:   09/13/11 1608  BP:   Pulse:   Temp: 37.4 C  Resp:     Post vital signs: stable  Level of consciousness: Patient remains intubated per anesthesia plan  Complications: No apparent anesthesia complications

## 2011-09-13 NOTE — Op Note (Signed)
NAME:  ELDRIDGE, MARCOTT NO.:  [id number]  MEDICAL RECORD NO.:  [id number]  LOCATION:  2305                         FACILITY:  [facility]  PHYSICIAN:  Kerin Perna, M.D.  DATE OF BIRTH:  22-Jun-1956  DATE OF PROCEDURE:  09/12/2011 DATE OF DISCHARGE:                              OPERATIVE REPORT   OPERATION: 1. Coronary artery bypass grafting x5 (left internal mammary artery     LAD, saphenous vein graft to first diagonal, saphenous vein graft     to distal RCA, sequential saphenous vein graft to ramus     intermediate and distal circumflex). 2. Endoscopic harvest, right leg greater saphenous vein.  PREOPERATIVE DIAGNOSES:  Severe three-vessel coronary artery disease and unstable angina.  POSTOPERATIVE DIAGNOSES:  Severe three-vessel coronary artery disease and unstable angina.  SURGEON:  Kerin Perna, MD  ASSISTANT:  Doree Fudge, PA-C  ANESTHESIA:  General by Arta Bruce, MD  INDICATIONS:  The patient is a 56 year old hypertensive male who presents with unstable angina.  Cardiac catheterization by Dr. Clifton James demonstrated severe multivessel coronary artery disease with preserved LV function.  His cardiac enzymes were negative.  He is felt to be candidate for surgical coronary revascularization.  Prior to surgery, I reviewed the results of the cardiac catheterization with the patient and his family and discussed indications and expected benefits of coronary artery bypass surgery for treatment of his coronary artery disease.  I discussed with him the major aspects of the operation including use of general anesthesia, location of the surgical incisions, the use of cardiopulmonary bypass, and the expected postoperative hospital recovery.  I discussed with the patient risks to him of coronary artery bypass surgery including risks of MI, stroke, CVA, bleeding, blood transfusion requirement, infection, and death.  He understood that with his underlying  disease of hepatitis C that hepatic failure could also be a risk of the surgery.  After reviewing these issues, he demonstrated his understanding and agreed to proceed with surgery under what I felt was an informed consent.  PROCEDURE IN DETAIL:  The patient was brought to the operating room, placed supine on the operative room table.  General anesthesia was induced under invasive hemodynamic monitoring.  The chest, abdomen, and legs were prepped and draped as a sterile field.  A proper time-out was performed.  A sternal incision was made as the saphenous vein was harvested endoscopically from the right leg.  The left internal mammary artery was harvested as a pedicle graft.  The sternal retractor was placed and the pericardium was opened.  Pursestrings were placed in the ascending aorta and right atrium and heparin was administered.  After the vein was harvested, the patient was cannulated and placed on cardiopulmonary bypass.  The coronaries were identified for grafting in the cardioplegic catheters were placed for both antegrade aortic and retrograde coronary sinus cardioplegia.  The patient was cooled 32 degrees and aortic cross-clamp was applied.  800 mL of cold blood cardioplegia was delivered in split doses between the antegrade aortic and retrograde coronary sinus catheters.  There is good cardioplegic arrest and temperature dropped less than 12 degrees.  Cardioplegia was delivered every 20 minutes.  The distal coronary  anastomoses were then performed.  The first distal anastomosis was to the distal right coronary over posterolateral branch of the right coronary.  It was 1.5 mm vessel.  Reverse saphenous vein was sewn end-to-side with running 7-0 Prolene.  The second distal anastomosis was the sequential vein graft to the ramus intermediate, continuing to the distal circumflex.  The ramus intermediate was 1.5 mm vessel and the vein was sewn side-to-side with running 7-0 Prolene.   It was continued as the sequential vein to the distal circumflex, which was a 1.5-mm vessel.  The vein was sewn end-to-side running 7-0 Prolene. There was good flow through the graft.  Cardioplegia was redosed.  The fourth distal anastomosis was the totally occluded first diagonal branch of the LAD.  It was a 1.4-mm vessel.  Reverse saphenous vein was sewn end-to-side running 7-0 Prolene with good flow through the graft.  The fifth distal anastomosis was the distal third of the LAD.  It had a proximal 90% stenosis.  The left IMA pedicle was brought through an opening in the lateral pericardium and was brought down onto the LAD and sewn end-to-side with running 8-0 Prolene.  There was excellent flow through the anastomosis and the pedicle was secured to the epicardium with 6-0 Prolene.  Cardioplegia was redosed.  The peripheral crossclamp was still in place.  Proximal vein anastomoses were performed on the ascending aorta using a 4.0-mm punch running 7-0 Prolene.  Air was vented from the coronaries with rose of retrograde warm blood cardioplegia.  The crossclamp was removed.  The heart resumed a spontaneous rhythm.  The grafts were opened and de- aired.  Each graft had good flow and hemostasis was documented at the proximal distal anastomoses.  The cardioplegic cannula was removed and the patient was warmed to 37 degrees.  Temporary pacing wires were applied.  The lungs were expanded.  Ventilator was resumed.  The patient was weaned off dopamine without difficulty.  He was in a sinus rhythm. Protamine was administered without adverse reaction.  The cannula was removed.  The mediastinum was irrigated with warm saline.  The superior pericardial fat was closed.  There was some diffuse oozing and the platelet count was 90,000, so the patient was administered 1 unit of platelets with improved coagulation function.  The anterior mediastinal and left pleural chest tube were placed and brought  out through separate incisions.  The sternum was closed with interrupted steel wire.  The pectoralis fascia was closed with running #1 Vicryl.  Subcutaneous and skin layers were closed with running Vicryl and sterile dressings were applied.  Total cardiopulmonary bypass time was 145 minutes.     Kerin Perna, M.D.     PV/MEDQ  D:  09/12/2011  T:  09/13/2011  Job:  [id number]  cc:   Verne Carrow, MD

## 2011-09-14 ENCOUNTER — Inpatient Hospital Stay (HOSPITAL_COMMUNITY): Payer: BC Managed Care – PPO

## 2011-09-14 LAB — CBC
MCH: 34 pg (ref 26.0–34.0)
MCV: 97.4 fL (ref 78.0–100.0)
Platelets: 98 10*3/uL — ABNORMAL LOW (ref 150–400)
RBC: 3.47 MIL/uL — ABNORMAL LOW (ref 4.22–5.81)
RDW: 12.8 % (ref 11.5–15.5)
WBC: 12.1 10*3/uL — ABNORMAL HIGH (ref 4.0–10.5)

## 2011-09-14 LAB — GLUCOSE, CAPILLARY
Glucose-Capillary: 147 mg/dL — ABNORMAL HIGH (ref 70–99)
Glucose-Capillary: 132 mg/dL — ABNORMAL HIGH (ref 70–99)

## 2011-09-14 LAB — BASIC METABOLIC PANEL
CO2: 30 mEq/L (ref 19–32)
Calcium: 8 mg/dL — ABNORMAL LOW (ref 8.4–10.5)
Creatinine, Ser: 0.6 mg/dL (ref 0.50–1.35)
GFR calc non Af Amer: >90 mL/min (ref >90)
Sodium: 130 mEq/L — ABNORMAL LOW (ref 135–145)

## 2011-09-14 MED ORDER — OXYCODONE HCL 5 MG PO TABS
5.0000 mg | ORAL_TABLET | ORAL | Status: DC | PRN
  Administered 2011-09-14: 10 mg via ORAL
  Administered 2011-09-14: 5 mg via ORAL
  Administered 2011-09-14 – 2011-09-17 (×18): 10 mg via ORAL
  Filled 2011-09-14 (×20): qty 2

## 2011-09-14 MED ORDER — MOVING RIGHT ALONG BOOK
Freq: Once | Status: AC
  Administered 2011-09-14: 20:00:00
  Filled 2011-09-14 (×2): qty 1

## 2011-09-14 MED ORDER — POTASSIUM CHLORIDE CRYS ER 20 MEQ PO TBCR
20.0000 meq | EXTENDED_RELEASE_TABLET | Freq: Every day | ORAL | Status: DC
  Administered 2011-09-14 – 2011-09-17 (×4): 20 meq via ORAL
  Filled 2011-09-14 (×4): qty 1

## 2011-09-14 MED ORDER — SODIUM CHLORIDE 0.9 % IJ SOLN
3.0000 mL | Freq: Two times a day (BID) | INTRAMUSCULAR | Status: DC
  Administered 2011-09-14 – 2011-09-17 (×6): 3 mL via INTRAVENOUS

## 2011-09-14 MED ORDER — SODIUM CHLORIDE 0.9 % IJ SOLN: 3.0000 mL | INTRAMUSCULAR | Status: DC | PRN

## 2011-09-14 MED ORDER — INSULIN ASPART 100 UNIT/ML ~~LOC~~ SOLN
0.0000 [IU] | Freq: Three times a day (TID) | SUBCUTANEOUS | Status: DC
  Administered 2011-09-14 – 2011-09-16 (×7): 2 [IU] via SUBCUTANEOUS

## 2011-09-14 MED ORDER — FUROSEMIDE 40 MG PO TABS
40.0000 mg | ORAL_TABLET | Freq: Every day | ORAL | Status: DC
  Administered 2011-09-14 – 2011-09-17 (×4): 40 mg via ORAL
  Filled 2011-09-14 (×4): qty 1

## 2011-09-14 MED ORDER — MIDAZOLAM HCL 2 MG/2ML IJ SOLN
INTRAMUSCULAR | Status: AC
  Administered 2011-09-14: 2 mg via INTRAVENOUS
  Filled 2011-09-14: qty 2

## 2011-09-14 MED ORDER — METOPROLOL TARTRATE 50 MG PO TABS
50.0000 mg | ORAL_TABLET | Freq: Two times a day (BID) | ORAL | Status: DC
Start: 2011-09-14 — End: 2011-09-15
  Administered 2011-09-14 (×2): 50 mg via ORAL
  Filled 2011-09-14 (×4): qty 1

## 2011-09-14 MED ORDER — SODIUM CHLORIDE 0.9 % IV SOLN: 250.0000 mL | INTRAVENOUS | Status: DC | PRN

## 2011-09-14 MED ORDER — MIDAZOLAM HCL 2 MG/2ML IJ SOLN
2.0000 mg | Freq: Once | INTRAMUSCULAR | Status: AC
  Administered 2011-09-14: 2 mg via INTRAVENOUS

## 2011-09-14 MED ORDER — METOPROLOL TARTRATE 25 MG PO TABS
25.0000 mg | ORAL_TABLET | Freq: Once | ORAL | Status: AC
  Administered 2011-09-14: 25 mg via ORAL
  Filled 2011-09-14: qty 1

## 2011-09-14 NOTE — Progress Notes (Signed)
All charting, assessments, treatments and medication administration given by SN have been supervised and verified to be correct from 1900 3/22 until 0730 3/23.

## 2011-09-14 NOTE — Progress Notes (Signed)
   CARDIOTHORACIC SURGERY PROGRESS NOTE   R2 Days Post-Op Procedure(s) (LRB): CORONARY ARTERY BYPASS GRAFTING (CABG) (N/A)  Subjective: Feels okay.  Mild soreness.  Feels tired.  Objective: Vital signs: BP Readings from Last 1 Encounters:  09/14/11 116/82   Pulse Readings from Last 1 Encounters:  09/14/11 93   Resp Readings from Last 1 Encounters:  09/14/11 10   Temp Readings from Last 1 Encounters:  09/14/11 99.1 F (37.3 C) Oral    Hemodynamics:    Physical Exam:  Rhythm:   Sinus tach with occ. PAC's  Breath sounds: clear  Heart sounds:  RRR  Incisions:  Clean and dry  Abdomen:  Soft, non-distended, non-tender  Extremities:  Warm, well perfused   Intake/Output from previous day: 03/22 0701 - 03/23 0700 In: 1147.5 [P.O.:600; I.V.:447.5; IV Piggyback:100] Out: 1575 [Urine:1245; Chest Tube:330] Intake/Output this shift: Total I/O In: 334 [P.O.:250; I.V.:80; IV Piggyback:4] Out: 90 [Urine:90]  Lab Results:  Basename 09/14/11 0350 09/13/11 1700  WBC 12.1* 11.1*  HGB 11.8* 12.1*  HCT 33.8* 35.5*  PLT 98* 110*   BMET:  Basename 09/14/11 0350 09/13/11 1700 09/13/11 1655 09/13/11 0308  NA 130* -- 135 --  K 4.8 -- 4.5 --  CL 96 -- 99 --  CO2 30 -- -- 24  GLUCOSE 137* -- 156* --  BUN 12 -- 14 --  CREATININE 0.60 0.63 -- --  CALCIUM 8.0* -- -- 7.7*    CBG (last 3)   Basename 09/14/11 0739 09/13/11 2323 09/13/11 1953  GLUCAP 134* 137* 176*   ABG    Component Value Date/Time   PHART 7.407 09/12/2011 1924   HCO3 25.6* 09/12/2011 1924   TCO2 27 09/13/2011 1655   O2SAT 97.0 09/12/2011 1924   CXR: clear  Assessment/Plan: S/P Procedure(s) (LRB): CORONARY ARTERY BYPASS GRAFTING (CABG) (N/A)  Doing well POD2 Expected post op acute blood loss anemia, mild, stable Expected post op volume excess, mild, diuresing   Mobilize  Increase beta blocker  Diuresis  Transfer 2000   Johnston Maddocks H 09/14/2011 11:29 AM

## 2011-09-15 ENCOUNTER — Inpatient Hospital Stay (HOSPITAL_COMMUNITY): Payer: BC Managed Care – PPO

## 2011-09-15 ENCOUNTER — Other Ambulatory Visit: Payer: Self-pay

## 2011-09-15 LAB — GLUCOSE, CAPILLARY
Glucose-Capillary: 107 mg/dL — ABNORMAL HIGH (ref 70–99)
Glucose-Capillary: 106 mg/dL — ABNORMAL HIGH (ref 70–99)
Glucose-Capillary: 115 mg/dL — ABNORMAL HIGH (ref 70–99)
Glucose-Capillary: 147 mg/dL — ABNORMAL HIGH (ref 70–99)
Glucose-Capillary: 121 mg/dL — ABNORMAL HIGH (ref 70–99)
Glucose-Capillary: 128 mg/dL — ABNORMAL HIGH (ref 70–99)

## 2011-09-15 LAB — CBC
MCHC: 34.6 g/dL (ref 30.0–36.0)
Platelets: 136 10*3/uL — ABNORMAL LOW (ref 150–400)
RDW: 12.4 % (ref 11.5–15.5)
WBC: 12.5 10*3/uL — ABNORMAL HIGH (ref 4.0–10.5)

## 2011-09-15 LAB — BASIC METABOLIC PANEL
Chloride: 94 mEq/L — ABNORMAL LOW (ref 96–112)
GFR calc Af Amer: >90 mL/min (ref >90)
GFR calc non Af Amer: >90 mL/min (ref >90)
Potassium: 4.2 mEq/L (ref 3.5–5.1)
Sodium: 129 mEq/L — ABNORMAL LOW (ref 135–145)

## 2011-09-15 MED ORDER — FUROSEMIDE 40 MG PO TABS: 40.0000 mg | ORAL_TABLET | Freq: Every day | ORAL | Status: DC

## 2011-09-15 MED ORDER — METOPROLOL TARTRATE 50 MG PO TABS: ORAL_TABLET | ORAL | Status: DC

## 2011-09-15 MED ORDER — AMIODARONE HCL IN DEXTROSE 360-4.14 MG/200ML-% IV SOLN
60.0000 mg/h | INTRAVENOUS | Status: AC
  Administered 2011-09-15 – 2011-09-16 (×2): 60 mg/h via INTRAVENOUS
  Filled 2011-09-15 (×3): qty 200

## 2011-09-15 MED ORDER — POTASSIUM CHLORIDE CRYS ER 20 MEQ PO TBCR: 20.0000 meq | EXTENDED_RELEASE_TABLET | Freq: Every day | ORAL | Status: DC

## 2011-09-15 MED ORDER — AMIODARONE LOAD VIA INFUSION
150.0000 mg | Freq: Once | INTRAVENOUS | Status: AC
  Administered 2011-09-15: 150 mg via INTRAVENOUS
  Filled 2011-09-15: qty 83.34

## 2011-09-15 MED ORDER — OXYCODONE HCL 5 MG PO TABS: 5.0000 mg | ORAL_TABLET | ORAL | Status: DC | PRN

## 2011-09-15 MED ORDER — METOPROLOL TARTRATE 25 MG PO TABS
75.0000 mg | ORAL_TABLET | Freq: Two times a day (BID) | ORAL | Status: DC
  Administered 2011-09-15 (×2): 75 mg via ORAL
  Filled 2011-09-15 (×2): qty 1

## 2011-09-15 MED ORDER — ASPIRIN 325 MG PO TBEC: 325.0000 mg | DELAYED_RELEASE_TABLET | Freq: Every day | ORAL | Status: AC

## 2011-09-15 MED ORDER — METOPROLOL TARTRATE 25 MG PO TABS
25.0000 mg | ORAL_TABLET | Freq: Once | ORAL | Status: AC
Start: 2011-09-15 — End: 2011-09-15
  Administered 2011-09-15: 25 mg via ORAL
  Filled 2011-09-15: qty 1

## 2011-09-15 MED ORDER — METOPROLOL TARTRATE 100 MG PO TABS
100.0000 mg | ORAL_TABLET | Freq: Two times a day (BID) | ORAL | Status: DC
  Administered 2011-09-16 – 2011-09-17 (×3): 100 mg via ORAL
  Filled 2011-09-15 (×4): qty 1

## 2011-09-15 MED ORDER — AMIODARONE HCL IN DEXTROSE 360-4.14 MG/200ML-% IV SOLN
30.0000 mg/h | INTRAVENOUS | Status: DC
  Filled 2011-09-15: qty 200

## 2011-09-15 NOTE — Progress Notes (Signed)
Subjective:   56 yo with hx of CAD , s/p CABG (CORONARY ARTERY BYPASS GRAFTING (CABG) x5:(LIMA to LAD, SVG to Diagonal 1, SVG sequentially to OM1 and distal Circumflex, and SVG to PDA) with EVH from the right thigh and calf.)  Doing well.  Feeling OK.       Manuel Bell aspirin EC  325 mg Oral Daily   Or  . aspirin  324 mg Per Tube Daily  . bisacodyl  10 mg Oral Daily   Or  . bisacodyl  10 mg Rectal Daily  . docusate sodium  200 mg Oral Daily  . furosemide  40 mg Oral Daily  . insulin aspart  0-24 Units Subcutaneous TID AC & HS  . metoprolol tartrate  50 mg Oral BID  . moving right along book   Does not apply Once  . pantoprazole  40 mg Oral Q1200  . potassium chloride  20 mEq Oral Daily  . sodium chloride  3 mL Intravenous Q12H  . DISCONTD: cefUROXime (ZINACEF)  IV  1.5 g Intravenous Q12H  . DISCONTD: insulin aspart  0-24 Units Subcutaneous Q4H  . DISCONTD: lisinopril  5 mg Oral Daily  . DISCONTD: metoprolol tartrate  25 mg Per Tube BID  . DISCONTD: metoprolol tartrate  25 mg Oral BID  . DISCONTD: sodium chloride  3 mL Intravenous Q12H    Objective:  Vital Signs in the last 24 hours: Blood pressure 133/87, pulse 103, temperature 98.8 F (37.1 C), temperature source Oral, resp. rate 18, height 5\' 10"  (1.778 m), weight 196 lb 3.2 oz (88.996 kg), SpO2 94.00%. Temp:  [98.4 F (36.9 C)-98.8 F (37.1 C)] 98.8 F (37.1 C) (03/24 0500) Pulse Rate:  [58-107] 103  (03/24 0500) Resp:  [10-25] 18  (03/24 0500) BP: (116-150)/(78-91) 133/87 mmHg (03/24 0500) SpO2:  [93 %-97 %] 94 % (03/24 0500) Weight:  [196 lb 3.2 oz (88.996 kg)] 196 lb 3.2 oz (88.996 kg) (03/24 0500)  Intake/Output from previous day: 03/23 0701 - 03/24 0700 In: 674 [P.O.:550; I.V.:120; IV Piggyback:4] Out: 240 [Urine:240] Intake/Output from this shift:    Physical Exam:  Physical Exam: Blood pressure 133/87, pulse 103, temperature 98.8 F (37.1 C), temperature source Oral, resp. rate 18, height 5\' 10"  (1.778  m), weight 196 lb 3.2 oz (88.996 kg), SpO2 94.00%. General: Well developed, well nourished, in no acute distress. Head: Normocephalic, atraumatic, sclera non-icteric, mucus membranes are moist,  Neck: Supple. Normal carotids. No JVD Lungs: Clear bilaterally to auscultation without wheezes, rales, or rhonchi. Breathing is unlabored. Heart: Regular rate,  With normal  S1 S2. Soft pericardia friction rub.  Tahcy, sternal wound clean and dry. Abdomen: Soft, non-tender, non-distended with normoactive bowel sounds. No hepatomegaly. No rebound/guarding. No abdominal masses. Msk:  Strength and tone appear normal for age. Extremities: No clubbing or cyanosis. No edema.  Distal pedal pulses are 2+ and equal bilaterally. Neuro: Alert and oriented X 3. Moves all extremities spontaneously. Psych:  Responds to questions appropriately with a normal affect.    Lab Results:   Basename 09/15/11 0516 09/14/11 0350 09/13/11 1700 09/13/11 0308  NA 129* 130* -- --  K 4.2 4.8 -- --  CL 94* 96 -- --  CO2 28 30 -- --  GLUCOSE 116* 137* -- --  BUN 14 12 -- --  CREATININE 0.67 0.60 -- --  CALCIUM 8.3* 8.0* -- --  MG -- -- 2.5 2.7*  PHOS -- -- -- --   No results found for this  basename: AST:2,ALT:2,ALKPHOS:2,BILITOT:2,PROT:2,ALBUMIN:2 in the last 72 hours No results found for this basename: LIPASE:2,AMYLASE:2 in the last 72 hours  Basename 09/15/11 0516 09/14/11 0350  WBC 12.5* 12.1*  NEUTROABS -- --  HGB 11.3* 11.8*  HCT 32.7* 33.8*  MCV 97.9 97.4  PLT 136* 98*     Tele:  Sinus tach at 101  Assessment/Plan:   1. CAD : s/p CABG.  Will increase metoprolol to 75  Mg  BID   Vesta Mixer, Montez Hageman., MD, [facility] 09/15/2011, 9:16 AM LOS: Day 4

## 2011-09-15 NOTE — Progress Notes (Signed)
Patient given Moving Right Along Book for patient education. Will continue to monitor.

## 2011-09-15 NOTE — Progress Notes (Signed)
Patient ambulated on room air with Student Nurse approximately 200 feet, using walker. Patient's gait was steady and patient walked at a moderate pace. Patient tolerated ambulation well. Patient returned to bed where he is sitting. Will continue to monitor.

## 2011-09-15 NOTE — Plan of Care (Signed)
Problem: Phase III Progression Outcomes Goal: Time patient transferred to PCTU/Telemetry POD Outcome: Completed/Met Date Met:  09/14/11 Patient transferred at [phone]

## 2011-09-15 NOTE — Progress Notes (Signed)
Patient and family watched video # 113- Recovering From Heart Surgery. Instructed to ask nurse if they had any questions. Kellie Simmering, Student Nurse

## 2011-09-15 NOTE — Progress Notes (Addendum)
                    [address]            [place] [address]          [phone]     3 Days Post-Op Procedure(s) (LRB): CORONARY ARTERY BYPASS GRAFTING (CABG) (N/A)  Subjective: Feels well, no complaints.  Objective: Vital signs in last 24 hours: Patient Vitals for the past 24 hrs:  BP Temp Temp src Pulse Resp SpO2 Weight  09/15/11 0500 133/87 mmHg 98.8 F (37.1 C) Oral 103  18  94 % 88.996 kg (196 lb 3.2 oz)  09/14/11 2018 135/84 mmHg 98.4 F (36.9 C) Oral 103  18  97 % -  09/14/11 1657 129/88 mmHg 98.5 F (36.9 C) Oral 94  18  96 % -  09/14/11 1600 133/78 mmHg - - 107  22  93 % -  09/14/11 1548 - 98.5 F (36.9 C) Oral - - - -  09/14/11 1500 133/78 mmHg - - 96  15  97 % -  09/14/11 1400 150/91 mmHg - - 58  24  95 % -  09/14/11 1300 131/78 mmHg - - 99  20  96 % -  09/14/11 1200 146/84 mmHg - - 103  25  96 % -  09/14/11 1159 - 98.8 F (37.1 C) Oral - - - -  09/14/11 1100 124/79 mmHg - - 66  11  97 % -   Current Weight  09/15/11 88.996 kg (196 lb 3.2 oz)     Intake/Output from previous day: 03/23 0701 - 03/24 0700 In: 674 [P.O.:550; I.V.:120; IV Piggyback:4] Out: 240 [Urine:240]  CBGs 132-116-115   PHYSICAL EXAM:  Heart: RRR, sl tachy around 100 Lungs: clear Wound: clean and dry Extremities: mild LE edema  Lab Results: CBC: Basename 09/15/11 0516 09/14/11 0350  WBC 12.5* 12.1*  HGB 11.3* 11.8*  HCT 32.7* 33.8*  PLT 136* 98*   BMET:  Basename 09/15/11 0516 09/14/11 0350  NA 129* 130*  K 4.2 4.8  CL 94* 96  CO2 28 30  GLUCOSE 116* 137*  BUN 14 12  CREATININE 0.67 0.60  CALCIUM 8.3* 8.0*    PT/INR:  Basename 09/12/11 1400  LABPROT 17.6*  INR 1.42   BJY:NWGNFAOZH atelectasis and tiny pleural effusions.  Tiny right apex pneumothorax questioned.   Assessment/Plan: S/P Procedure(s) (LRB): CORONARY ARTERY BYPASS GRAFTING (CABG) (N/A) CV- tachy with increased BP.  Beta blocker increased by cardiology. Vol overload-  diurese. Hyponatremia- watch Na+ CRPI, pulm toilet.   LOS: 4 days    COLLINS,GINA H 09/15/2011   I have seen and examined the patient and agree with the assessment and plan as outlined.  Likely ready for d/c home by Tuesday.  Navayah Sok H 09/15/2011 11:46 AM

## 2011-09-15 NOTE — Progress Notes (Signed)
Charlene from Radiology called to notify of a possible, questionable pneumothorax at the right apex per 2 view chest xray this morning. Please see radiology report.

## 2011-09-16 ENCOUNTER — Other Ambulatory Visit: Payer: Self-pay

## 2011-09-16 LAB — BASIC METABOLIC PANEL
CO2: 31 mEq/L (ref 19–32)
Calcium: 8.1 mg/dL — ABNORMAL LOW (ref 8.4–10.5)
Chloride: 94 mEq/L — ABNORMAL LOW (ref 96–112)
Creatinine, Ser: 0.65 mg/dL (ref 0.50–1.35)
GFR calc Af Amer: >90 mL/min (ref >90)
Sodium: 131 mEq/L — ABNORMAL LOW (ref 135–145)

## 2011-09-16 LAB — GLUCOSE, CAPILLARY
Glucose-Capillary: 120 mg/dL — ABNORMAL HIGH (ref 70–99)
Glucose-Capillary: 121 mg/dL — ABNORMAL HIGH (ref 70–99)

## 2011-09-16 MED ORDER — AMIODARONE HCL 400 MG PO TABS: 400.0000 mg | ORAL_TABLET | Freq: Two times a day (BID) | ORAL | Status: DC

## 2011-09-16 MED ORDER — AMIODARONE HCL 200 MG PO TABS
400.0000 mg | ORAL_TABLET | Freq: Two times a day (BID) | ORAL | Status: DC
  Administered 2011-09-16 (×2): 400 mg via ORAL
  Filled 2011-09-16 (×4): qty 2

## 2011-09-16 MED ORDER — METOPROLOL TARTRATE 100 MG PO TABS: ORAL_TABLET | ORAL | Status: DC

## 2011-09-16 MED FILL — Sodium Chloride Irrigation Soln 0.9%: Qty: 3000 | Status: AC

## 2011-09-16 MED FILL — Electrolyte-R (PH 7.4) Solution: INTRAVENOUS | Qty: 4000 | Status: AC

## 2011-09-16 MED FILL — Mannitol IV Soln 20%: INTRAVENOUS | Qty: 500 | Status: AC

## 2011-09-16 MED FILL — Sodium Chloride IV Soln 0.9%: INTRAVENOUS | Qty: 1000 | Status: AC

## 2011-09-16 MED FILL — Sodium Bicarbonate IV Soln 8.4%: INTRAVENOUS | Qty: 50 | Status: AC

## 2011-09-16 MED FILL — Heparin Sodium (Porcine) Inj 1000 Unit/ML: INTRAMUSCULAR | Qty: 10 | Status: AC

## 2011-09-16 MED FILL — Lidocaine HCl IV Inj 20 MG/ML: INTRAVENOUS | Qty: 5 | Status: AC

## 2011-09-16 MED FILL — Heparin Sodium (Porcine) Inj 1000 Unit/ML: INTRAMUSCULAR | Qty: 30 | Status: AC

## 2011-09-16 NOTE — Progress Notes (Signed)
Patient converted to NSR. EKG performed to confirm it. Will continue to monitor.

## 2011-09-16 NOTE — Progress Notes (Signed)
CARDIAC REHAB PHASE I   PRE:  Rate/Rhythm: 85SR  BP:  Supine:   Sitting: 118/66  Standing:    SaO2: 95%RA  MODE:  Ambulation: 790 ft   POST:  Rate/Rhythem: 103  BP:  Supine:   Sitting: 130/80  Standing:    SaO2: 94%RA 1000-1110 Pt walked 790 ft on RA with steady gait. Tolerated well. To recliner after walk. Sats good on RA. Education completed with pt and caregiver. Permission given to refer to [facility] Phase 2. Both have watched [facility] d/c video.  Duanne Limerick

## 2011-09-16 NOTE — Progress Notes (Signed)
UR Completed.  Genny Caulder Jane [phone] 09/16/2011  

## 2011-09-16 NOTE — Progress Notes (Signed)
Patient's HR was fluctuating 130s to 150s sinus tach, patient was symptomatic with palpitations while lying in bed. Blood pressure was147/86.  Was told in report that this had been happening during the day and he had bursts of AFib and would go back to NSR. MD notified orders received.  Additonal dose of metoprolol 25mg  po was given at 2208 and metropolol was increased to 100mg  po BID.  Patient's HR continued to fluctuate, and started maintaing 150s and had gotten as high as 170; BP was 109/74. EKG was performed which showed rapid Afib w/RVR.  Blood pressure was taken immediately after EKG 190/74. MD was notified and orders received.  Amiodarone drip per protocol with a 150mg  bolus was started. Patient's blood pressure 112/75 HR in 90-100s. Will continue to monitor.

## 2011-09-16 NOTE — Discharge Summary (Addendum)
Physician Discharge Summary  Patient ID: Manuel Bell MRN: [id number] DOB/AGE: 07-12-1955 56 y.o.  Admit date: 09/11/2011 Discharge date: 09/17/2011  Admission Diagnoses: 1.Multi vessel CAD 2.History of hypertension 3.History of hyperlipidemia 4.History of Hepatitis C 5.History of tobacco abuse  Discharge Diagnoses:  1.Multi vessel CAD 2.History of hypertension 3.History of hyperlipidemia 4.History of Hepatitis C 5.History of tobacco abuse 6.ABL anemia 7.Thrombocytopenia 8.Phlebitis of right outer forearm   Procedure (s):  1.Left main: No obstructive disease.  Left Anterior Descending Artery: Large caliber vessel that courses to the apex. The proximal and mid vessel is heavily calcified. The proximal vessel has a 99% stenosis. The mid vessel is diffusely disease with discreet 80% stenosis. The first diagonal branch has 100% proximal occlusion and is heavily calcified. This appears to be a moderate sized vessel. The second diagonal is a smaller caliber vessel with plaque disease. The distal LAD has luminal irregularities.  Circumflex Artery: Ramus intermediate branch with 70% proximal stenosis, moderate sized vessel. The mid Circumflex has a 90% stenosis at the takeoff of a small caliber marginal branch.  Right Coronary Artery: Large, dominant vessel with 50% proximal stenosis, 70% mid stenosis. There is a 40% stenosis at the takeoff of the PDA.  Left Ventricular Angiogram: Hypokinesis of the anterior wall and apex. LVEF= 45-50%.  Impression:  (1). Triple vessel CAD  (2). Segmental LV systolic dysfunction  (3). Unstable angina (chest pain on table with injections)  2. Coronary artery bypass grafting x5 (left internal mammary artery  LAD, saphenous vein graft to first diagonal, saphenous vein graft  to distal RCA, sequential saphenous vein graft to ramus  intermediate and distal circumflex) with endoscopic harvest, right leg greater saphenous vein by Dr. Donata Clay on  09/12/2011.  History of Presenting Illness:  This is a 56 year old Caucasian male admitted with unstable angina. His chest pain has been progressing in intensity and frequency over the past several weeks. The patient, at the time of admission, was getting chest pain walking only 20 feet on level ground. The pain is relieved by rest. There is no associated radiation, no nausea, but there is associated shortness of breath . There has not been any syncope or lightheadedness.  He currently works full time and lives alone.  The patient's risk factors include a past history of smoking, hypertension, hyperlipidemia, and positive family history of CAD. He underwent a cardiac catheterization by Dr. Sanjuana Kava on 09/11/2011. Results indicated triple-vessel coronary artery disease. A cardiothoracic consultation was obtained with Dr. Zenaida Niece tray for consideration of coronary artery bypass grafting surgery. Pre operative carotid duplex ultrasound showed no significant internal carotid artery stenoses bilaterally. Potential risks, benefits, and complications of the surgery discussed with the patient and he agreed to proceed. He underwent CABG x5 on 09/12/2011.  Brief Hospital Course:  He was extubated without difficulty earlier the evening of surgery. He remained afebrile and hemodynamically stable. His Swan-Ganz, A-line, chest tubes, and Foley were all removed early in his postoperative course. He was found to be volume overloaded and diuresed accordingly. He also found to have mild acute blood loss anemia postoperatively to his H&H was low as 11.3 and 32.7. Now require postoperative transfusion. He also found have thrombocytopenia postoperatively. His platelet count went as low as 97,000; however, his last platelet count was up to 36,000. He was felt surgically stable for transfer from the intensive care to PCTU for further convalescence on 09/14/2011. He has already been tolerating a diet and has had a bowel movement. He  did  go into atrial fibrillation with RVR the evening of 09/15/2011. He was placed on an amiodarone drip. He then converted to sinus rhythm. His amiodarone drip was discontinued and he was placed on  Amiodarone by mouth. In addition, his Lopressor was increased 100 mg by mouth 2 times daily. Epicardial pacing wires and chest tube sutures were removed this am.He had one brief episode of afib with RVR last evening, but has maintained SR since then. LFTS showed AST to be slightly elevated at 81 and ALT to be 94. Per Dr. Donata Clay, will continue Amiodarone as instructed. He is not on a statin secondary to elevated transaminases and history of Hepatitis C.Per Dr. Donata Clay, he is surgically stable for discharge today 09/17/2011.  Filed Vitals:   09/16/11 0516  BP: 129/80  Pulse: 87  Temp: 98.5 F (36.9 C)  Resp: 17     Latest Vital Signs: Blood pressure 129/80, pulse 87, temperature 98.5 F (36.9 C), temperature source Oral, resp. rate 17, height 5\' 10"  (1.778 m), weight 194 lb 12.8 oz (88.361 kg), SpO2 90.00%.  Physical Exam: Cardiovascular: RRR, no murmurs, gallops, or rubs.  Pulmonary: Diminished at bases L>R; no rales, wheezes, or rhonchi.  Abdomen: Soft, non tender, bowel sounds present.  Extremities: Mild bilateral lower extremity edema.Ecchymosis right calf  Wounds: Clean and dry. No erythema or signs of infection.   Discharge Condition:Stable  Recent laboratory studies:  Lab Results  Component Value Date   WBC 12.5* 09/15/2011   HGB 11.3* 09/15/2011   HCT 32.7* 09/15/2011   MCV 97.9 09/15/2011   PLT 136* 09/15/2011   Lab Results  Component Value Date   NA 131* 09/16/2011   K 4.1 09/16/2011   CL 94* 09/16/2011   CO2 31 09/16/2011   CREATININE 0.65 09/16/2011   GLUCOSE 116* 09/16/2011      Diagnostic Studies: Dg Chest 2 View  09/15/2011  *RADIOLOGY REPORT*  Clinical Data: Post heart surgery, follow-up post chest tube removal  CHEST - 2 VIEW  Comparison: 09/14/2011  Findings:  Enlargement of cardiac silhouette post CABG. Newly tortuous thoracic aorta. Pulmonary vascularity normal. Bibasilar atelectasis. Upper lungs clear. Tiny bilateral pleural effusions. Tiny right apex pneumothorax questioned.  IMPRESSION: Enlargement of cardiac silhouette post CABG. Bibasilar atelectasis and tiny pleural effusions. Tiny right apex pneumothorax questioned.  Original Report Authenticated By: Lollie Marrow, M.D.  Dg Chest Portable 1 View In Am    Discharge Orders    Future Appointments: Provider: Department: Dept Phone: Center:   10/07/2011 3:00 PM [facility] [phone] [facility]   10/16/2011 10:30 AM Pecola Lawless, MD [facility] [phone] [facility]      Discharge Medications: Medication List  As of 09/16/2011 12:10 PM   STOP taking these medications         amLODipine 10 MG tablet      carvedilol 25 MG tablet      lisinopril 40 MG tablet      nitroGLYCERIN 0.4 MG SL tablet         TAKE these medications         amiodarone 400 MG tablet   Commonly known as: PACERONE   Take 1 tablet (400 mg total) by mouth 2 (two) times daily. For one week;then take Amiodarone 400 mg po daily thereafter.      aspirin 325 MG EC tablet   Take 1 tablet (325 mg total) by mouth daily.      furosemide 40 MG tablet   Commonly known  as: LASIX   Take 1 tablet (40 mg total) by mouth daily.      lansoprazole 30 MG capsule   Commonly known as: PREVACID   TAKE ONE CAPSULE BY MOUTH EVERY DAY      metoprolol 100 MG tablet   Commonly known as: LOPRESSOR   Take 100 mg po two times daily.      oxyCODONE 5 MG immediate release tablet   Commonly known as: Oxy IR/ROXICODONE   Take 1-2 tablets (5-10 mg total) by mouth every 3 (three) hours as needed for pain.      potassium chloride SA 20 MEQ tablet   Commonly known as: K-DUR,KLOR-CON   Take 1 tablet (20 mEq total) by mouth daily.                    Cephalexin 500 mg tablet             Commonly known as: Keflex              Take 1 tablet (500 mg total) by mouth three times daily for 5 days then stop.    Follow Up Appointments: Follow-up Information    Follow up with VAN Dinah Beers, MD in 3 weeks. (Office will call with appointment)    Contact information:   [address] [place] [address] [phone]       Follow up with Verne Carrow, MD. Schedule an appointment as soon as possible for a visit in 2 weeks.   Contact information:   [facility] [address] [place] [address] [phone]          Signed: Fredda Hammed 09/16/2011, 12:10 PM   patient examined and medical record reviewed,agree with above note. VAN TRIGT III,Danyka Merlin 09/22/2011

## 2011-09-16 NOTE — Progress Notes (Signed)
Patient ambulated in the hallway 550 feet. HR 81 prior to walk. HR 86 after walk. Patient tolerated well. Will continue to monitor. Lajuana Matte, RN

## 2011-09-16 NOTE — Progress Notes (Addendum)
4 Days Post-Op Procedure(s) (LRB): CORONARY ARTERY BYPASS GRAFTING (CABG) (N/A)  Subjective: Patient with occasional cough (clear sputum).   Objective: Vital signs in last 24 hours: Patient Vitals for the past 24 hrs:  BP Temp Temp src Pulse Resp SpO2 Weight  09/16/11 0516 129/80 mmHg 98.5 F (36.9 C) Oral 87  17  90 % 194 lb 12.8 oz (88.361 kg)  09/15/11 2215 190/74 mmHg - - 96  - - -  09/15/11 2108 147/86 mmHg 97 F (36.1 C) Oral 106  18  90 % -  09/15/11 2038 129/81 mmHg - - 97  19  - -  09/15/11 1500 119/78 mmHg 98.2 F (36.8 C) Oral 94  16  94 % -   Pre op weight  83.5 kg Current Weight  09/16/11 194 lb 12.8 oz (88.361 kg)      Intake/Output from previous day: 03/24 0701 - 03/25 0700 In: 596.5 [P.O.:240; I.V.:356.5] Out: 476 [Urine:475; Stool:1]   Physical Exam:  Cardiovascular: RRR, no murmurs, gallops, or rubs. Pulmonary: Diminished at bases L>R; no rales, wheezes, or rhonchi. Abdomen: Soft, non tender, bowel sounds present. Extremities: Mild bilateral lower extremity edema.Ecchymosis right calf Wounds: Clean and dry.  No erythema or signs of infection.  Lab Results: CBC: Basename 09/15/11 0516 09/14/11 0350  WBC 12.5* 12.1*  HGB 11.3* 11.8*  HCT 32.7* 33.8*  PLT 136* 98*   BMET:  Basename 09/15/11 0516 09/14/11 0350  NA 129* 130*  K 4.2 4.8  CL 94* 96  CO2 28 30  GLUCOSE 116* 137*  BUN 14 12  CREATININE 0.67 0.60  CALCIUM 8.3* 8.0*    PT/INR: No results found for this basename: LABPROT,INR in the last 72 hours ABG:  INR: Will add last result for INR, ABG once components are confirmed Will add last 4 CBG results once components are confirmed  Assessment/Plan:  1. CV - Previous afib with RVR, possible brief NSVT.Converted to SR around 5:15 am.On Amiodarone gttp, Lopressor 100 bid.Stop amiodarone gttp 1 hour after first po dose given. 2.  Pulmonary - Encourage incentive spirometer. 3. Volume Overload - Diurese. 4.  Acute blood loss anemia -  Last H/H stable at 11.3/32.7. 5.Thrombocytopenia-Platelets up to 136,000. 6.Pre op HGA1C 5.5.CBGs 121/128/120.Will stop SS and glucose checks. 7.Possible discharge in am if maintains SR.   ZIMMERMAN,DONIELLE MPA-C 09/16/2011   patient examined and medical record reviewed,agree with above note. Plan home on po amiodorone Check LFT's tomorrow w/ hx hep C VAN TRIGT III,Manuel Bell 09/16/2011

## 2011-09-16 NOTE — Progress Notes (Signed)
   CARE MANAGEMENT NOTE 09/16/2011  Patient:  Manuel Bell, Manuel Bell   Account Number:  [id number]  Date Initiated:  09/16/2011  Documentation initiated by:  Jisell Majer  Subjective/Objective Assessment:   PT S/P CABG X 5 ON 3/22.  PTA, PT LIVES ALONE.     Action/Plan:   MET WITH PT AND FRIEND TO DISCUSS DISCHARGE PLANS.  PT STATES FRIEND AND SISTER TO PROVIDE 24HR CARE AT DISCHARGE.   Anticipated DC Date:  09/17/2011   Anticipated DC Plan:  HOME/SELF CARE      DC Planning Services  CM consult      Choice offered to / List presented to:             Status of service:  In process, will continue to follow Medicare Important Message given?   (If response is "NO", the following Medicare IM given date fields will be blank) Date Medicare IM given:   Date Additional Medicare IM given:    Discharge Disposition:  HOME/SELF CARE  Per UR Regulation:    If discussed at Long Length of Stay Meetings, dates discussed:    Comments:  09/16/11 Laurene Melendrez,RN,BSN 1100 PT WITH SOME POST OP AFIB LAST PM; STARTED ON IV AMIO DRIP. PT DENIES ANY HOME NEEDS.  AMBULATING WELL WITHOUT WALKER. Phone #[phone]

## 2011-09-16 NOTE — Progress Notes (Signed)
Patient ambulated 550 feet with RW. Patient tolerated well. Will continue to monitor. Lajuana Matte, RN

## 2011-09-17 LAB — COMPREHENSIVE METABOLIC PANEL
ALT: 94 U/L — ABNORMAL HIGH (ref 0–53)
AST: 81 U/L — ABNORMAL HIGH (ref 0–37)
Albumin: 2.6 g/dL — ABNORMAL LOW (ref 3.5–5.2)
Alkaline Phosphatase: 51 U/L (ref 39–117)
BUN: 13 mg/dL (ref 6–23)
CO2: 28 mEq/L (ref 19–32)
Calcium: 8.4 mg/dL (ref 8.4–10.5)
Chloride: 96 mEq/L (ref 96–112)
Creatinine, Ser: 0.64 mg/dL (ref 0.50–1.35)
GFR calc Af Amer: >90 mL/min (ref >90)
GFR calc non Af Amer: >90 mL/min (ref >90)
Glucose, Bld: 110 mg/dL — ABNORMAL HIGH (ref 70–99)
Potassium: 4.3 mEq/L (ref 3.5–5.1)
Sodium: 132 mEq/L — ABNORMAL LOW (ref 135–145)
Total Bilirubin: 0.8 mg/dL (ref 0.3–1.2)
Total Protein: 6 g/dL (ref 6.0–8.3)

## 2011-09-17 MED ORDER — CEPHALEXIN 500 MG PO CAPS
500.0000 mg | ORAL_CAPSULE | Freq: Three times a day (TID) | ORAL | Status: DC
  Administered 2011-09-17 (×2): 500 mg via ORAL
  Filled 2011-09-17 (×4): qty 1

## 2011-09-17 MED ORDER — AMIODARONE HCL 200 MG PO TABS
200.0000 mg | ORAL_TABLET | Freq: Two times a day (BID) | ORAL | Status: DC
  Administered 2011-09-17: 200 mg via ORAL
  Filled 2011-09-17 (×2): qty 1

## 2011-09-17 MED ORDER — CEPHALEXIN 500 MG PO CAPS: 500.0000 mg | ORAL_CAPSULE | Freq: Three times a day (TID) | ORAL | Status: AC

## 2011-09-17 NOTE — Progress Notes (Signed)
5 Days Post-Op Procedure(s) (LRB): CORONARY ARTERY BYPASS GRAFTING (CABG) (N/A)  Subjective: Patient without complaints.   Objective: Vital signs in last 24 hours: Patient Vitals for the past 24 hrs:  BP Temp Temp src Pulse Resp SpO2 Weight  09/17/11 0459 130/83 mmHg 97 F (36.1 C) Oral 89  18  96 % 190 lb 12.8 oz (86.546 kg)  09/16/11 2124 117/79 mmHg 98.7 F (37.1 C) Oral 93  18  94 % -  09/16/11 1400 130/84 mmHg 98.3 F (36.8 C) Oral 81  18  94 % -   Pre op weight  83.5 kg Current Weight  09/17/11 190 lb 12.8 oz (86.546 kg)      Intake/Output from previous day: 03/25 0701 - 03/26 0700 In: 1560 [P.O.:1560] Out: -    Physical Exam:  Cardiovascular: RRR, no murmurs, gallops, or rubs. Pulmonary: Diminished at bases L>R; no rales, wheezes, or rhonchi. Abdomen: Soft, non tender, bowel sounds present. Extremities: Mild bilateral lower extremity edema.Ecchymosis right calf Wounds: Clean and dry.  No erythema or signs of infection.  Lab Results: CBC:  Basename 09/15/11 0516  WBC 12.5*  HGB 11.3*  HCT 32.7*  PLT 136*   BMET:   Basename 09/17/11 0545 09/16/11 0625  NA 132* 131*  K 4.3 4.1  CL 96 94*  CO2 28 31  GLUCOSE 110* 116*  BUN 13 14  CREATININE 0.64 0.65  CALCIUM 8.4 8.1*    PT/INR: No results found for this basename: LABPROT,INR in the last 72 hours ABG:  INR: Will add last result for INR, ABG once components are confirmed Will add last 4 CBG results once components are confirmed  Assessment/Plan:  1. CV - Previous afib with RVR, possible brief NSVT.Converted to SR around 5:15 am 3/25.Had a brief run of afib with RVR around 8:40 pm last evening. SR since then.Continue Amiodarone po and Lopressor 100 bid.   2.  Pulmonary - Encourage incentive spirometer. 3. Volume Overload - Diurese. 4.  Acute blood loss anemia - Last H/H stable at 11.3/32.7. 5.Thrombocytopenia-Platelets up to 136,000. 7.Remove EPW and CT sutures. 8.LFTs this am show AST 81, ALT  94 and Alk Phos is within normal limits.Has a history of Hepatitis C.Not on statin. Will discuss with Dr. Donata Clay regarding length of Amiodarone. 9.Phlebitis of right outer forearm (secondary to IV infiltration). There is erythema and warmth so will give Keflex.   Rosaline Ezekiel MPA-C 09/17/2011

## 2011-09-17 NOTE — Progress Notes (Signed)
CARDIAC REHAB PHASE I   PRE:  Rate/Rhythm: 83SR  BP:  Supine: 104/60  Sitting:   Standing:    SaO2: 96%RA  MODE:  Ambulation: 890 ft   POST:  Rate/Rhythem: 97  BP:  Supine:   Sitting:   Standing:  Brushing teeth at sink after walk   SaO2: 94%RA 0900-0917 Pt walked 890 ft on RA with rolling walker with steady gait. Tolerated well.  Duanne Limerick

## 2011-09-17 NOTE — Progress Notes (Signed)
   CARE MANAGEMENT NOTE 09/17/2011  Patient:  [place] Bell, [place] Bell   Account Number:  [id number]  Date Initiated:  09/16/2011  Documentation initiated by:  Jaquaya Coyle  Subjective/Objective Assessment:   PT S/P CABG X 5 ON 3/22.  PTA, PT LIVES ALONE.     Action/Plan:   MET WITH PT AND FRIEND TO DISCUSS DISCHARGE PLANS.  PT STATES FRIEND AND SISTER TO PROVIDE 24HR CARE AT DISCHARGE.   Anticipated DC Date:  09/17/2011   Anticipated DC Plan:  HOME/SELF CARE      DC Planning Services  CM consult      [facility] Choice  HOME HEALTH   Choice offered to / List presented to:  C-1 Patient        HH arranged  HH-1 RN      [facility] agency  [facility]   Status of service:  Completed, signed off Medicare Important Message given?   (If response is "NO", the following Medicare IM given date fields will be blank) Date Medicare IM given:   Date Additional Medicare IM given:    Discharge Disposition:  HOME/SELF CARE  Per UR Regulation:    If discussed at Long Length of Stay Meetings, dates discussed:    Comments:  09/17/11 Makira Holleman,RN,BSN 1300 CM REFERRAL FOR HHRN; REFERRAL TO [facility], PER PT CHOICE. START OF CARE 24-48H POST DC DATE. PATIENT CONTACT #S:  CELL:  [phone] OR [phone]  09/16/11 Charliene Inoue,RN,BSN 1100 PT WITH SOME POST OP AFIB LAST PM; STARTED ON IV AMIO DRIP. PT DENIES ANY HOME NEEDS.  AMBULATING WELL WITHOUT WALKER.

## 2011-09-17 NOTE — Progress Notes (Signed)
Assessed patient's ambulation status. Patient stated he walked three times today. Will continue to monitor. 

## 2011-09-17 NOTE — Progress Notes (Signed)
DC'd EPW  And CT sutures per MD order per hospital policy. Pacing wires intact on removal. Patient stable in NSR 86. Applied benzoin and steri strips to CT suture site. Patient tolerated well, will continue to monitor closely. Patient reminded to stay in bed for 1 hour. Lajuana Matte, RN

## 2011-09-17 NOTE — Progress Notes (Signed)
   CARE MANAGEMENT NOTE 09/17/2011  Patient:  [place] Bell, [place] Bell   Account Number:  [id number]  Date Initiated:  09/16/2011  Documentation initiated by:  Nasha Diss  Subjective/Objective Assessment:   PT S/P CABG X 5 ON 3/22.  PTA, PT LIVES ALONE.     Action/Plan:   MET WITH PT AND FRIEND TO DISCUSS DISCHARGE PLANS.  PT STATES FRIEND AND SISTER TO PROVIDE 24HR CARE AT DISCHARGE.   Anticipated DC Date:  09/17/2011   Anticipated DC Plan:  HOME/SELF CARE      DC Planning Services  CM consult      [facility] Choice  HOME HEALTH   Choice offered to / List presented to:  C-1 Patient        HH arranged  HH-1 RN      [facility] agency  [facility]   Status of service:  Completed, signed off Medicare Important Message given?   (If response is "NO", the following Medicare IM given date fields will be blank) Date Medicare IM given:   Date Additional Medicare IM given:    Discharge Disposition:  HOME/SELF CARE  Per UR Regulation:    If discussed at Long Length of Stay Meetings, dates discussed:    Comments:  09/17/11 Makale Pindell,RN,BSN 1300 CM REFERRAL FOR HHRN; REFERRAL TO [facility], PER PT CHOICE. START OF CARE 24-48H POST DC DATE. Phone #[phone]   09/16/11 Endrit Gittins,RN,BSN 1100 PT WITH SOME POST OP AFIB LAST PM; STARTED ON IV AMIO DRIP. PT DENIES ANY HOME NEEDS.  AMBULATING WELL WITHOUT WALKER. Phone #[phone]

## 2011-09-17 NOTE — Progress Notes (Signed)
   SUBJECTIVE:  Mild incisional chest pain.  No SOB   PHYSICAL EXAM Filed Vitals:   09/16/11 0516 09/16/11 1400 09/16/11 2124 09/17/11 0459  BP: 129/80 130/84 117/79 130/83  Pulse: 87 81 93 89  Temp: 98.5 F (36.9 C) 98.3 F (36.8 C) 98.7 F (37.1 C) 97 F (36.1 C)  TempSrc: Oral Oral Oral Oral  Resp: 17 18 18 18   Height:      Weight: 194 lb 12.8 oz (88.361 kg)   190 lb 12.8 oz (86.546 kg)  SpO2: 90% 94% 94% 96%   General:  No distress Lungs:  Clear Heart:  RRR Abdomen:  Positive bowel sounds, no rebound no guarding Extremities:  No edema.  LABS: Lab Results  Component Value Date   TROPONINI 0.05 10/19/2008   Results for orders placed during the hospital encounter of 09/11/11 (from the past 24 hour(s))  GLUCOSE, CAPILLARY     Status: Abnormal   Collection Time   09/16/11 11:33 AM      Component Value Range   Glucose-Capillary 121 (*) 70 - 99 (mg/dL)   Comment 1 Notify RN    COMPREHENSIVE METABOLIC PANEL     Status: Abnormal   Collection Time   09/17/11  5:45 AM      Component Value Range   Sodium 132 (*) 135 - 145 (mEq/L)   Potassium 4.3  3.5 - 5.1 (mEq/L)   Chloride 96  96 - 112 (mEq/L)   CO2 28  19 - 32 (mEq/L)   Glucose, Bld 110 (*) 70 - 99 (mg/dL)   BUN 13  6 - 23 (mg/dL)   Creatinine, Ser 1.61  0.50 - 1.35 (mg/dL)   Calcium 8.4  8.4 - 09.6 (mg/dL)   Total Protein 6.0  6.0 - 8.3 (g/dL)   Albumin 2.6 (*) 3.5 - 5.2 (g/dL)   AST 81 (*) 0 - 37 (U/L)   ALT 94 (*) 0 - 53 (U/L)   Alkaline Phosphatase 51  39 - 117 (U/L)   Total Bilirubin 0.8  0.3 - 1.2 (mg/dL)   GFR calc non Af Amer >90  >90 (mL/min)   GFR calc Af Amer >90  >90 (mL/min)    Intake/Output Summary (Last 24 hours) at 09/17/11 0454 Last data filed at 09/16/11 2125  Gross per 24 hour  Intake   1560 ml  Output      0 ml  Net   1560 ml    ASSESSMENT AND PLAN:  1)  CAD:  Status post CABG.  I will arrange follow up.  2)  Atrial fibrillation:   Discharge on amiodarone 200 mg bid.  We will likely  stop this at the first outpatient appt.  3)  Dyslipidemia:  He is not on statin with elevated liver enzymes.  He needs excellent diet.  Needs to stop ETOH.  Fayrene Fearing [facility] 09/17/2011 8:08 AM

## 2011-09-19 ENCOUNTER — Other Ambulatory Visit: Payer: Self-pay | Admitting: *Deleted

## 2011-09-19 DIAGNOSIS — G8918 Other acute postprocedural pain: Secondary | ICD-10-CM

## 2011-09-19 MED ORDER — OXYCODONE HCL 5 MG PO TABS: 5.0000 mg | ORAL_TABLET | ORAL | Status: DC | PRN

## 2011-09-22 NOTE — Discharge Summary (Signed)
patient examined and medical record reviewed,agree with above note. VAN TRIGT III,Abir Eroh 09/22/2011

## 2011-09-24 ENCOUNTER — Other Ambulatory Visit: Payer: Self-pay | Admitting: *Deleted

## 2011-09-24 DIAGNOSIS — Z951 Presence of aortocoronary bypass graft: Secondary | ICD-10-CM

## 2011-09-24 DIAGNOSIS — G8918 Other acute postprocedural pain: Secondary | ICD-10-CM

## 2011-09-24 DIAGNOSIS — Z0279 Encounter for issue of other medical certificate: Secondary | ICD-10-CM

## 2011-09-24 HISTORY — DX: Presence of aortocoronary bypass graft: Z95.1

## 2011-09-24 MED ORDER — OXYCODONE HCL 5 MG PO TABS: 5.0000 mg | ORAL_TABLET | ORAL | Status: DC | PRN

## 2011-09-26 ENCOUNTER — Telehealth: Payer: Self-pay | Admitting: Cardiology

## 2011-09-26 NOTE — Telephone Encounter (Signed)
Spoke with pt who is concerned about his Blood pressures.  He reports he has been taking his BP 3 to 4 times a day.  They range from 160/96 to 131/84.  He would like to know if he should adjust his medications.  Pt instructed that he doe not need to take his BP 3 and 4 times a day.  He is aware that Dr Antoine Poche is not in the office this week and will review next week.  He is aware I will call back with any medication changes.

## 2011-09-26 NOTE — Telephone Encounter (Signed)
New Msg: pt calling wanting to speak with nurse/MD regarding pt BP. Please return pt call to discuss further.

## 2011-09-29 NOTE — Telephone Encounter (Signed)
He can talk with Lorin Picket about this at his upcoming appt.

## 2011-09-30 NOTE — Telephone Encounter (Signed)
Pt aware and will discuss with Scott at his appt.

## 2011-10-02 ENCOUNTER — Ambulatory Visit (INDEPENDENT_AMBULATORY_CARE_PROVIDER_SITE_OTHER): Payer: BC Managed Care – PPO | Admitting: Physician Assistant

## 2011-10-02 ENCOUNTER — Telehealth: Payer: Self-pay | Admitting: *Deleted

## 2011-10-02 ENCOUNTER — Encounter: Payer: Self-pay | Admitting: Physician Assistant

## 2011-10-02 VITALS — BP 112/80 | HR 60 | Ht 70.0 in | Wt 174.1 lb

## 2011-10-02 DIAGNOSIS — I10 Essential (primary) hypertension: Secondary | ICD-10-CM

## 2011-10-02 DIAGNOSIS — G4700 Insomnia, unspecified: Secondary | ICD-10-CM

## 2011-10-02 DIAGNOSIS — I2510 Atherosclerotic heart disease of native coronary artery without angina pectoris: Secondary | ICD-10-CM

## 2011-10-02 DIAGNOSIS — I4891 Unspecified atrial fibrillation: Secondary | ICD-10-CM

## 2011-10-02 DIAGNOSIS — E785 Hyperlipidemia, unspecified: Secondary | ICD-10-CM

## 2011-10-02 DIAGNOSIS — B1920 Unspecified viral hepatitis C without hepatic coma: Secondary | ICD-10-CM

## 2011-10-02 HISTORY — DX: Atherosclerotic heart disease of native coronary artery without angina pectoris: I25.10

## 2011-10-02 LAB — HEPATIC FUNCTION PANEL
ALT: 93 U/L — ABNORMAL HIGH (ref 0–53)
AST: 85 U/L — ABNORMAL HIGH (ref 0–37)
Alkaline Phosphatase: 98 U/L (ref 39–117)
Bilirubin, Direct: 0.2 mg/dL (ref 0.0–0.3)
Total Bilirubin: 0.5 mg/dL (ref 0.3–1.2)

## 2011-10-02 MED ORDER — LISINOPRIL 10 MG PO TABS: 10.0000 mg | ORAL_TABLET | Freq: Every day | ORAL | Status: DC

## 2011-10-02 MED ORDER — METOPROLOL TARTRATE 100 MG PO TABS: ORAL_TABLET | ORAL | Status: DC

## 2011-10-02 MED ORDER — ZOLPIDEM TARTRATE 5 MG PO TABS: 5.0000 mg | ORAL_TABLET | Freq: Every evening | ORAL | Status: DC | PRN

## 2011-10-02 MED ORDER — AMIODARONE HCL 400 MG PO TABS: 400.0000 mg | ORAL_TABLET | Freq: Two times a day (BID) | ORAL | Status: DC

## 2011-10-02 MED ORDER — ASPIRIN 325 MG PO TBEC: 325.0000 mg | DELAYED_RELEASE_TABLET | Freq: Every day | ORAL | Status: DC

## 2011-10-02 NOTE — Progress Notes (Signed)
[address] [place], [place]  [address] Phone: [phone] Fax:  [phone]  Date:  10/02/2011   Name:  [place] Bell       DOB:  1956-02-14 MRN:  [id number]  PCP:  Dr. Alwyn Ren  Primary Cardiologist:  Dr. Rollene Rotunda  Primary Electrophysiologist:  None    History of Present Illness: [place] Bell is a 56 y.o. male who presents for post hospital follow up.  He was evaluated by Dr. Antoine Poche 3/19 for chest discomfort.  LHC 09/11/11: 3 vessel CAD, EF 45-50%.  He was referred for bypass surgery.  This was performed Dr. Donata Clay.  Grafts included LIMA to LAD, SVG-D1, SVG-distal RCA, SVG-RI/distal circumflex.  Pre-CABG Dopplers demonstrated no significant ICA stenosis.  He had postoperative volume overload.  He had postoperative thrombocytopenia.  He also had postoperative atrial fibrillation.  He was placed on amiodarone.  LFTs were noted to be elevated.  He does have a history of hepatitis C and I believe his elevated LFTs are chronic.  Overall, doing well.  His chest is sore.  His breathing is good.  He denies orthopnea, PND.  He has mild ankle edema.  He took Lasix and potassium for several days.  He is no longer taking this.  He remains on amiodarone.  He has occasional flutters.  No prolonged palpitations.  He is concerned about his poor sleep.  He is having difficulty sleeping through the night.  He will start cardiac rehabilitation soon.  Past Medical History  Diagnosis Date  . HTN (hypertension)   . Hepatitis C     s/p interferon/[facility]; chronically elevated LFTs  . Hyperlipidemia   . CAD (coronary artery disease)     LHC 09/11/11: 3 vessel CAD, EF 45-50%  . Hx of CABG 08/2011    Dr. Donata Clay - LIMA to LAD, SVG-D1, SVG-distal RCA, SVG-RI/distal circumflex  . Postoperative atrial fibrillation 08/2011    amiodarone    Current Outpatient Prescriptions  Medication Sig Dispense Refill  . amiodarone (PACERONE) 400 MG tablet Take 1 tablet (400 mg  total) by mouth 2 (two) times daily. For one week;then take Amiodarone 400 mg po daily thereafter.  60 tablet  1  . lansoprazole (PREVACID) 30 MG capsule TAKE ONE CAPSULE BY MOUTH EVERY DAY  90 capsule  1  . metoprolol (LOPRESSOR) 100 MG tablet Take 100 mg po two times daily.  60 tablet  1  . oxyCODONE (OXY IR/ROXICODONE) 5 MG immediate release tablet Take 1-2 tablets (5-10 mg total) by mouth every 4 (four) hours as needed for pain.  40 tablet  0  . furosemide (LASIX) 40 MG tablet Take 1 tablet (40 mg total) by mouth daily.  7 tablet  0  . potassium chloride SA (K-DUR,KLOR-CON) 20 MEQ tablet Take 1 tablet (20 mEq total) by mouth daily.  7 tablet  0  . DISCONTD: amLODipine (NORVASC) 10 MG tablet Take 1 tablet (10 mg total) by mouth daily.  90 tablet  0  . DISCONTD: carvedilol (COREG) 25 MG tablet Take 1 tablet (25 mg total) by mouth 2 (two) times daily with a meal.  60 tablet  3  . DISCONTD: nitroGLYCERIN (NITROSTAT) 0.4 MG SL tablet Place 1 tablet (0.4 mg total) under the tongue every 5 (five) minutes as needed for chest pain (no more than 3 tabs a day).  20 tablet  0    Allergies: No Known Allergies  History  Substance Use Topics  . Smoking  status: Former Smoker -- 1.0 packs/day for 30 years    Types: Cigarettes    Quit date: 09/10/2003  . Smokeless tobacco: Not on file  . Alcohol Use: Yes     ROS:  Please see the history of present illness.    All other systems reviewed and negative.   PHYSICAL EXAM: VS:  BP 112/80  Pulse 60  Ht 5\' 10"  (1.778 m)  Wt 174 lb 1.9 oz (78.98 kg)  BMI 24.98 kg/m2 Repeat blood pressure by me: Left 150/84; right 140/90  Well nourished, well developed, in no acute distress HEENT: normal Neck: no JVD Cardiac:  normal S1, S2; RRR; no murmur Chest: Median sternotomy scar well healed without erythema or discharge Lungs:  clear to auscultation bilaterally, no wheezing, rhonchi or rales Abd: soft, nontender, no hepatomegaly Ext: no edema; right groin  without hematoma or bruit  Skin: warm and dry Neuro:  CNs 2-12 intact, no focal abnormalities noted  EKG:  Sinus rhythm, heart rate 60, normal axis, incomplete right bundle branch block, nonspecific ST-T wave changes  Lab Results  Component Value Date   ALT 94* 09/17/2011   AST 81* 09/17/2011   ALKPHOS 51 09/17/2011   BILITOT 0.8 09/17/2011   Lab Results  Component Value Date   TSH 1.815 09/11/2011     ASSESSMENT AND PLAN:  1. Coronary atherosclerosis of native coronary artery  Doing well post bypass.  He will start cardiac rehabilitation soon.  Continue aspirin.  Followup with Dr. Antoine Poche in 6 weeks.   2. HYPERLIPIDEMIA  He is not on a statin due to chronic hepatitis C and elevated LFTs.   3. HYPERTENSION, ESSENTIAL NOS  Uncontrolled.  He was on multiple antihypertensives prior to surgery.  I will restart lisinopril 10 mg a day.  Check a basic metabolic panel in one week.   4. Insomnia  He asked for Xanax.  I will give him a one-time prescription of Ambien 5 mg q.h.s. P.r.n., #15, no refills.  If she has further problems with insomnia he can discuss this with his PCP.   5. Atrial fibrillation  He had postoperative atrial fibrillation.  He remains in sinus rhythm.  He remains on amiodarone.  I would like to get him off of amiodarone as soon as possible with his hepatitis C and elevated LFTs.  I will discuss this further with Dr. Antoine Poche.  I suppose if we stopped it after 4 weeks this would be adequate.  I will be in touch with the patient after talking with Dr. Antoine Poche.  Check LFTs today.   6. Hepatitis C  Check LFTs today.       Luna Glasgow, PA-C  9:23 AM 10/02/2011

## 2011-10-02 NOTE — Telephone Encounter (Signed)
Message copied by Tarri Fuller on Wed Oct 02, 2011  3:40 PM ------      Message from: [place], [place] T      Created: Wed Oct 02, 2011  1:49 PM       LFTs stable      Tereso Newcomer, [place]  1:49 PM 10/02/2011

## 2011-10-02 NOTE — Patient Instructions (Signed)
Your physician recommends that you schedule a follow-up appointment in: 6 weeks with Dr Antoine Poche Your physician recommends that you have lab work drawn today (liver panel) and 1 week (BMP) Your physician has recommended you make the following change in your medication: START Lisinopril 10 mg daily

## 2011-10-02 NOTE — Telephone Encounter (Signed)
lmom lft's stable. Danielle Rankin

## 2011-10-03 ENCOUNTER — Other Ambulatory Visit: Payer: Self-pay | Admitting: Cardiothoracic Surgery

## 2011-10-03 DIAGNOSIS — I2510 Atherosclerotic heart disease of native coronary artery without angina pectoris: Secondary | ICD-10-CM

## 2011-10-04 ENCOUNTER — Telehealth: Payer: Self-pay | Admitting: Physician Assistant

## 2011-10-04 ENCOUNTER — Other Ambulatory Visit: Payer: Self-pay | Admitting: *Deleted

## 2011-10-04 ENCOUNTER — Telehealth: Payer: Self-pay | Admitting: *Deleted

## 2011-10-04 DIAGNOSIS — G8918 Other acute postprocedural pain: Secondary | ICD-10-CM

## 2011-10-04 MED ORDER — OXYCODONE HCL 5 MG PO TABS: 5.0000 mg | ORAL_TABLET | ORAL | Status: DC | PRN

## 2011-10-04 NOTE — Telephone Encounter (Signed)
Tell patient I spoke to Dr. Rollene Rotunda He should stop Amiodarone after 4 weeks. This was started in the hospital 3/25. So, he should stop Amiodarone after 10/17/2011. Tereso Newcomer, PA-C  11:15 AM 10/04/2011

## 2011-10-04 NOTE — Telephone Encounter (Signed)
A prescription was signed by Dr. Laneta Simmers for pick up by a friend for Mr. Barry Dienes.

## 2011-10-04 NOTE — Telephone Encounter (Signed)
error 

## 2011-10-04 NOTE — Telephone Encounter (Signed)
Pt notified of recommendations from Dr. Antoine Poche and Tereso Newcomer, [facility] to d/c amiodarone, pt last dose of amiodarone will be on 10/17/11, pt gave verbal read back to me with understanding today. Danielle Rankin

## 2011-10-07 ENCOUNTER — Ambulatory Visit (INDEPENDENT_AMBULATORY_CARE_PROVIDER_SITE_OTHER): Payer: Self-pay | Admitting: Physician Assistant

## 2011-10-07 ENCOUNTER — Ambulatory Visit
Admission: RE | Admit: 2011-10-07 | Discharge: 2011-10-07 | Disposition: A | Discharge status: Home / Self Care | Payer: BC Managed Care – PPO | Source: Ambulatory Visit | Attending: Cardiothoracic Surgery | Admitting: Cardiothoracic Surgery

## 2011-10-07 VITALS — BP 114/76 | HR 60 | Resp 18 | Ht 70.0 in | Wt 175.0 lb

## 2011-10-07 DIAGNOSIS — Z951 Presence of aortocoronary bypass graft: Secondary | ICD-10-CM

## 2011-10-07 DIAGNOSIS — I2510 Atherosclerotic heart disease of native coronary artery without angina pectoris: Secondary | ICD-10-CM

## 2011-10-07 NOTE — Progress Notes (Signed)
HPI:  Patient returns for routine postoperative follow-up having undergone CABG x5 by Dr. Zenaida Niece to right on 09/12/2011. The patient's early postoperative recovery while in the hospital was notable for atrial fibrillation with rapid ventricular rate. He currently has no complaints. He denies any chest pain, shortness of breath, fever, or chills.   Current Outpatient Prescriptions  Medication Sig Dispense Refill  . amiodarone (PACERONE) 400 MG tablet Take 400 mg by mouth daily. For one week;then take Amiodarone 400 mg po daily thereafter.      Marland Kitchen aspirin 325 MG EC tablet Take 1 tablet (325 mg total) by mouth daily.      . lansoprazole (PREVACID) 30 MG capsule TAKE ONE CAPSULE BY MOUTH EVERY DAY  90 capsule  1  . lisinopril (PRINIVIL,ZESTRIL) 10 MG tablet Take 1 tablet (10 mg total) by mouth daily.  90 tablet  3  . metoprolol (LOPRESSOR) 100 MG tablet Take 100 mg po two times daily.  180 tablet  3  . oxyCODONE (OXY IR/ROXICODONE) 5 MG immediate release tablet Take 1-2 tablets (5-10 mg total) by mouth every 4 (four) hours as needed for pain.  40 tablet  0  . zolpidem (AMBIEN) 5 MG tablet Take 1 tablet (5 mg total) by mouth at bedtime as needed for sleep.  15 tablet  0  . DISCONTD: amiodarone (PACERONE) 400 MG tablet Take 1 tablet (400 mg total) by mouth 2 (two) times daily. For one week;then take Amiodarone 400 mg po daily thereafter.  180 tablet  3  . DISCONTD: amLODipine (NORVASC) 10 MG tablet Take 1 tablet (10 mg total) by mouth daily.  90 tablet  0  . DISCONTD: carvedilol (COREG) 25 MG tablet Take 1 tablet (25 mg total) by mouth 2 (two) times daily with a meal.  60 tablet  3  . DISCONTD: nitroGLYCERIN (NITROSTAT) 0.4 MG SL tablet Place 1 tablet (0.4 mg total) under the tongue every 5 (five) minutes as needed for chest pain (no more than 3 tabs a day).  20 tablet  0  Vital Signs: Blood pressure 114/76, heart rate 60, respirations 18, O2 sat 90% on room air  Physical Exam:  Cardiovascular:  Regular rate and rhythm; no murmurs or rub. Pulmonary: Clear to auscultation bilaterally; no rales, wheezes, or rhonchi. Abdomen: Soft, nontender, sporadic bowel sounds. Extremities: No cyanosis, clubbing, or edema. Wounds: Sternum is solid. Wound is clean, dry, well-healed. Right lower extremity wound is clean and dry. There were 2 echar's removed.   Diagnostic Tests: PA lateral chest x-ray showed no pneumothorax, improved aeration in (resolution of previously seen small bilateral pleural effusions and bibasilar atelectasis)  Impression and Plan: Mr. Stripling continues to make steady progress he his Re: seen Dr. Jenene Slicker assistant in follow up. His amiodarone has been decreased to 400 mg by mouth daily, as he is maintaining sinus rhythm. He was instructed to stop this on 10/17/2011. He was also placed on lisinopril 10 mg by mouth daily, as his systolic blood pressure had remained in the 140's-150's. It should be noted that he is not on a statin as the patient has a history of hepatitis C and his transaminases were found elevated on 10/02/2011 (AST 85, ALT 93). He was instructed that providing is not taking any narcotics for pain, he may begin driving short distances  i.e. 30 minutes or less during the day. He may gradually increase his frequency and duration. He has also been contacted by cardiac rehabilitation and I have  encouraged him to participate  in the program. Finally, he was encouraged to continue with sternal precautions i.e. no lifting more than 10 pounds for the next 3-4 weeks. He'll be seen by Dr. Donata Clay on an as needed basis and continue to be followed closely by Dr. Jenene Slicker office.

## 2011-10-09 ENCOUNTER — Other Ambulatory Visit: Payer: Self-pay | Admitting: *Deleted

## 2011-10-09 ENCOUNTER — Other Ambulatory Visit: Payer: Self-pay

## 2011-10-09 DIAGNOSIS — G8918 Other acute postprocedural pain: Secondary | ICD-10-CM

## 2011-10-09 MED ORDER — OXYCODONE HCL 5 MG PO TABS: 5.0000 mg | ORAL_TABLET | ORAL | Status: AC | PRN

## 2011-10-09 NOTE — Telephone Encounter (Signed)
Script picked up by a friend.  He understands this will be the last prescription for Oxycodone IR that he will receive.

## 2011-10-10 ENCOUNTER — Encounter (HOSPITAL_COMMUNITY)
Admission: RE | Admit: 2011-10-10 | Discharge: 2011-10-10 | Disposition: A | Discharge status: Home / Self Care | Payer: BC Managed Care – PPO | Source: Ambulatory Visit | Attending: Cardiology | Admitting: Cardiology

## 2011-10-10 ENCOUNTER — Other Ambulatory Visit (INDEPENDENT_AMBULATORY_CARE_PROVIDER_SITE_OTHER): Payer: BC Managed Care – PPO

## 2011-10-10 DIAGNOSIS — I2510 Atherosclerotic heart disease of native coronary artery without angina pectoris: Secondary | ICD-10-CM | POA: Insufficient documentation

## 2011-10-10 DIAGNOSIS — Z87891 Personal history of nicotine dependence: Secondary | ICD-10-CM | POA: Insufficient documentation

## 2011-10-10 DIAGNOSIS — Z7982 Long term (current) use of aspirin: Secondary | ICD-10-CM | POA: Insufficient documentation

## 2011-10-10 DIAGNOSIS — Z79899 Other long term (current) drug therapy: Secondary | ICD-10-CM | POA: Insufficient documentation

## 2011-10-10 DIAGNOSIS — E785 Hyperlipidemia, unspecified: Secondary | ICD-10-CM | POA: Insufficient documentation

## 2011-10-10 DIAGNOSIS — I10 Essential (primary) hypertension: Secondary | ICD-10-CM | POA: Insufficient documentation

## 2011-10-10 DIAGNOSIS — I200 Unstable angina: Secondary | ICD-10-CM | POA: Insufficient documentation

## 2011-10-10 DIAGNOSIS — Z951 Presence of aortocoronary bypass graft: Secondary | ICD-10-CM | POA: Insufficient documentation

## 2011-10-10 DIAGNOSIS — Z5189 Encounter for other specified aftercare: Secondary | ICD-10-CM | POA: Insufficient documentation

## 2011-10-10 DIAGNOSIS — I2582 Chronic total occlusion of coronary artery: Secondary | ICD-10-CM | POA: Insufficient documentation

## 2011-10-10 DIAGNOSIS — I4891 Unspecified atrial fibrillation: Secondary | ICD-10-CM | POA: Insufficient documentation

## 2011-10-10 DIAGNOSIS — B1920 Unspecified viral hepatitis C without hepatic coma: Secondary | ICD-10-CM | POA: Insufficient documentation

## 2011-10-10 DIAGNOSIS — Z8249 Family history of ischemic heart disease and other diseases of the circulatory system: Secondary | ICD-10-CM | POA: Insufficient documentation

## 2011-10-10 LAB — BASIC METABOLIC PANEL
CO2: 30 mEq/L (ref 19–32)
Chloride: 100 mEq/L (ref 96–112)
Glucose, Bld: 103 mg/dL — ABNORMAL HIGH (ref 70–99)
Potassium: 4.6 mEq/L (ref 3.5–5.1)
Sodium: 136 mEq/L (ref 135–145)

## 2011-10-10 NOTE — Progress Notes (Signed)
Cardiac Rehab Medication Review by a Pharmacist  Does the patient  feel that his/her medications are working for him/her?  yes  Has the patient been experiencing any side effects to the medications prescribed?  no  Does the patient measure his/her own blood pressure or blood glucose at home?  yes   Does the patient have any problems obtaining medications due to transportation or finances?   no  Understanding of regimen: excellent Understanding of indications: excellent Potential of compliance: excellent  Zoe Lan, PharmD pgr [phone] 10/10/2011, 8:35 AM

## 2011-10-11 ENCOUNTER — Telehealth: Payer: Self-pay | Admitting: *Deleted

## 2011-10-11 NOTE — Telephone Encounter (Signed)
pt notified of lab results today and gave verbal understanding.[place]  

## 2011-10-11 NOTE — Telephone Encounter (Signed)
Message copied by Tarri Fuller on Fri Oct 11, 2011  8:51 AM ------      Message from: [place], [place] T      Created: Fri Oct 11, 2011  5:44 AM       Please notify patient that the lab results are ok.      Tereso Newcomer, PA-C  5:43 AM 10/11/2011

## 2011-10-14 ENCOUNTER — Encounter (HOSPITAL_COMMUNITY)
Admission: RE | Admit: 2011-10-14 | Discharge: 2011-10-14 | Payer: BC Managed Care – PPO | Source: Ambulatory Visit | Attending: Cardiology | Admitting: Cardiology

## 2011-10-14 NOTE — Progress Notes (Signed)
Pt started cardiac rehab today.  Pt tolerated light exercise without difficulty. Telemetry Sinus rhythm with a bundle branch block. [place] Bell was noted to have a triplet during cool down.  Patient asymptomatic.  Pam Dr Hochrein's nurse called and notified.  Will send  ECG tracings via media manager for review. Billy left cardiac rehab without complaints. Will continue to monitor the patient throughout  the program.

## 2011-10-16 ENCOUNTER — Encounter: Payer: BC Managed Care – PPO | Admitting: Internal Medicine

## 2011-10-16 ENCOUNTER — Encounter (HOSPITAL_COMMUNITY)
Admission: RE | Admit: 2011-10-16 | Discharge: 2011-10-16 | Disposition: A | Discharge status: Home / Self Care | Payer: BC Managed Care – PPO | Source: Ambulatory Visit | Attending: Cardiovascular Disease | Admitting: Cardiovascular Disease

## 2011-10-16 ENCOUNTER — Telehealth: Payer: Self-pay | Admitting: Cardiology

## 2011-10-16 NOTE — Progress Notes (Signed)
Manuel Bell 56 y.o. male       Nutrition Screen                                                                    YES  NO Do you live in a nursing home?  X   Do you eat out more than 3 times/week?    X If yes, how many times per week do you eat out?  Do you have food allergies?   X If yes, what are you allergic to?  Have you gained or lost more than 10 lbs without trying?              X  If yes, how much weight have you lost and over what time period? 15 lbs gained or lost over 1 month  Do you want to lose weight?     X If yes, what is a goal weight or amount of weight you would like to lose?  lb  Do you eat alone most of the time?   X   Do you eat less than 2 meals/day?  X If yes, how many meals do you eat?  Do you drink more than 3 alcohol drinks/day? X  If yes, how many drinks per day?  Are you having trouble with constipation? *  X If yes, what are you doing to help relieve constipation?  Do you have financial difficulties with buying food?*    X   Are you experiencing regular nausea/ vomiting?*     X   Do you have a poor appetite? *                                        X   Do you have trouble chewing/swallowing? *   X    Pt with diagnoses of:  X CABG              X GERD          X Dyslipidemia  / HDL< 40 / LDL>70 / High TG      X %  Body fat >goal / Body Mass Index >25 X HTN / BP >120/80       Pt Risk Score   2       Diagnosis Risk Score  25       Total Risk Score   27                         High Risk               X Low Risk    HT: 68.5" Ht Readings from Last 1 Encounters:  10/10/11 5' 8.5" (1.74 m)    WT:   173.1 lb (78.7 kg) Wt Readings from Last 3 Encounters:  10/10/11 173 lb 8 oz (78.7 kg)  10/07/11 175 lb (79.379 kg)  10/02/11 174 lb 1.9 oz (78.98 kg)     IBW 71.4 110%IBW BMI 26 26.1%body fat  Meds reviewed. Pt is not on a statin due to Hepatitis C. Past Medical History  Diagnosis Date  . HTN (hypertension)   .  Hepatitis C     s/p  interferon/[facility] Rx; chronically elevated LFTs  . Hyperlipidemia   . CAD (coronary artery disease)     LHC 09/11/11: 3 vessel CAD, EF 45-50%  . Hx of CABG 08/2011    Dr. Donata Clay - LIMA to LAD, SVG-D1, SVG-distal RCA, SVG-RI/distal circumflex  . Postoperative atrial fibrillation 08/2011    amiodarone       Activity level: Pt is active Wt goal: 173 lb ( 78.7 kg) Current tobacco use? No Food/Drug Interaction? No Labs:  Lipid Panel     Component Value Date/Time   CHOL 183 09/12/2011 0538   TRIG 126 09/12/2011 0538   HDL 49 09/12/2011 0538   CHOLHDL 3.7 09/12/2011 0538   VLDL 25 09/12/2011 0538   LDLCALC 109* 09/12/2011 0538   Lab Results  Component Value Date   HGBA1C 5.5 08/27/2011   09/17/11 Glucose 110  LDL goal: < 100      MI, DM, Carotid or PVD and > 2:      HTN, > 58 yo male Estimated Daily Nutrition Needs for: ? wt  maintenance 2400-2550 Kcal , Total Fat 75-85gm, Saturated Fat 18-20 gm, Trans Fat 2.6-2.8 gm,  Sodium less than 1500 mg

## 2011-10-16 NOTE — Telephone Encounter (Signed)
Walk in pt Form " Pt Dropped Off [employer] paperwork" that needs to be completed" this will  Be sent to [facility]  10/16/11/Km

## 2011-10-18 ENCOUNTER — Encounter (HOSPITAL_COMMUNITY)
Admission: RE | Admit: 2011-10-18 | Discharge: 2011-10-18 | Disposition: A | Discharge status: Home / Self Care | Payer: BC Managed Care – PPO | Source: Ambulatory Visit | Attending: Cardiology | Admitting: Cardiology

## 2011-10-21 ENCOUNTER — Encounter (HOSPITAL_COMMUNITY)
Admission: RE | Admit: 2011-10-21 | Discharge: 2011-10-21 | Disposition: A | Discharge status: Home / Self Care | Payer: BC Managed Care – PPO | Source: Ambulatory Visit | Attending: Cardiology | Admitting: Cardiology

## 2011-10-21 NOTE — Progress Notes (Signed)
Billy had some blood pressure elevations at cardiac rehab today. Initial blood pressure 160/90.  Exit blood pressure 122/60. . Will send exercise flow sheets for review via media manager for Dr Antoine Poche.

## 2011-10-21 NOTE — Progress Notes (Signed)
Reviewed home exercise with pt today.  Pt plans to walk and use home pool for exercise.  Reviewed THR, pulse, RPE, sign and symptoms, and when to call 911 or MD.  Pt voiced understanding. Fabio Pierce, MA, ACSM RCEP

## 2011-10-23 ENCOUNTER — Encounter (HOSPITAL_COMMUNITY)
Admission: RE | Admit: 2011-10-23 | Discharge: 2011-10-23 | Disposition: A | Discharge status: Home / Self Care | Payer: BC Managed Care – PPO | Source: Ambulatory Visit | Attending: Cardiovascular Disease | Admitting: Cardiovascular Disease

## 2011-10-23 DIAGNOSIS — Z79899 Other long term (current) drug therapy: Secondary | ICD-10-CM | POA: Insufficient documentation

## 2011-10-23 DIAGNOSIS — Z7982 Long term (current) use of aspirin: Secondary | ICD-10-CM | POA: Insufficient documentation

## 2011-10-23 DIAGNOSIS — I2582 Chronic total occlusion of coronary artery: Secondary | ICD-10-CM | POA: Insufficient documentation

## 2011-10-23 DIAGNOSIS — I4891 Unspecified atrial fibrillation: Secondary | ICD-10-CM | POA: Insufficient documentation

## 2011-10-23 DIAGNOSIS — I10 Essential (primary) hypertension: Secondary | ICD-10-CM | POA: Insufficient documentation

## 2011-10-23 DIAGNOSIS — Z8249 Family history of ischemic heart disease and other diseases of the circulatory system: Secondary | ICD-10-CM | POA: Insufficient documentation

## 2011-10-23 DIAGNOSIS — Z87891 Personal history of nicotine dependence: Secondary | ICD-10-CM | POA: Insufficient documentation

## 2011-10-23 DIAGNOSIS — E785 Hyperlipidemia, unspecified: Secondary | ICD-10-CM | POA: Insufficient documentation

## 2011-10-23 DIAGNOSIS — I2510 Atherosclerotic heart disease of native coronary artery without angina pectoris: Secondary | ICD-10-CM | POA: Insufficient documentation

## 2011-10-23 DIAGNOSIS — B1920 Unspecified viral hepatitis C without hepatic coma: Secondary | ICD-10-CM | POA: Insufficient documentation

## 2011-10-23 DIAGNOSIS — Z5189 Encounter for other specified aftercare: Secondary | ICD-10-CM | POA: Insufficient documentation

## 2011-10-23 DIAGNOSIS — Z951 Presence of aortocoronary bypass graft: Secondary | ICD-10-CM | POA: Insufficient documentation

## 2011-10-23 DIAGNOSIS — I200 Unstable angina: Secondary | ICD-10-CM | POA: Insufficient documentation

## 2011-10-23 NOTE — Progress Notes (Signed)
Tilden Fossa 56 y.o. male Nutrition Note Spoke with pt.  Nutrition Plan and Nutrition Survey reviewed with pt. Pt is following Step 2 of the Therapeutic Lifestyle Changes diet. Pt cannot take statins. Increasing soluble fiber and Phytosterols in the diet discussed. Pt expressed understanding. Nutrition Diagnosis Food-and nutrition-related knowledge deficit related to lack of exposure to information as related to diagnosis of: ? CVD  Nutrition RX/ Estimated Daily Nutrition Needs for: wt maintenance 2400-2550 Kcal, 75-85 gm fat, 18-20 gm sat fat, 2.6-2.8 gm trans-fat, <1500 mg sodium  Nutrition Intervention   Pt's individual nutrition plan including cholesterol goals reviewed with pt.   Benefits of adopting Therapeutic Lifestyle Changes discussed when Medficts reviewed.   Pt to attend the Portion Distortion class - met 10/16/11   Pt to attend the  ? Nutrition I class - met 10/15/11                         ? Nutrition II class - met 10/22/11    Pt given handouts for: ? Increasing soluble fiber to help decrease cholesterol ? Sources of Phytosterols to help lower cholesterol   Continue client-centered nutrition education by RD, as part of interdisciplinary care. Goal(s)   Pt to describe the benefit of including fruits, vegetables, whole grains, and low-fat dairy products in a heart healthy meal plan. Monitor and Evaluate progress toward nutrition goal with team.

## 2011-10-25 ENCOUNTER — Encounter (HOSPITAL_COMMUNITY)
Admission: RE | Admit: 2011-10-25 | Discharge: 2011-10-25 | Disposition: A | Discharge status: Home / Self Care | Payer: BC Managed Care – PPO | Source: Ambulatory Visit | Attending: Cardiovascular Disease | Admitting: Cardiovascular Disease

## 2011-10-28 ENCOUNTER — Encounter (HOSPITAL_COMMUNITY)
Admission: RE | Admit: 2011-10-28 | Discharge: 2011-10-28 | Disposition: A | Discharge status: Home / Self Care | Payer: BC Managed Care – PPO | Source: Ambulatory Visit | Attending: Cardiovascular Disease | Admitting: Cardiovascular Disease

## 2011-10-30 ENCOUNTER — Encounter (HOSPITAL_COMMUNITY)
Admission: RE | Admit: 2011-10-30 | Discharge: 2011-10-30 | Disposition: A | Discharge status: Home / Self Care | Payer: BC Managed Care – PPO | Source: Ambulatory Visit | Attending: Cardiovascular Disease | Admitting: Cardiovascular Disease

## 2011-11-01 ENCOUNTER — Encounter (HOSPITAL_COMMUNITY)
Admission: RE | Admit: 2011-11-01 | Discharge: 2011-11-01 | Disposition: A | Discharge status: Home / Self Care | Payer: BC Managed Care – PPO | Source: Ambulatory Visit | Attending: Cardiovascular Disease | Admitting: Cardiovascular Disease

## 2011-11-04 ENCOUNTER — Encounter (HOSPITAL_COMMUNITY)
Admission: RE | Admit: 2011-11-04 | Discharge: 2011-11-04 | Disposition: A | Discharge status: Home / Self Care | Payer: BC Managed Care – PPO | Source: Ambulatory Visit | Attending: Cardiovascular Disease | Admitting: Cardiovascular Disease

## 2011-11-06 ENCOUNTER — Encounter (HOSPITAL_COMMUNITY)
Admission: RE | Admit: 2011-11-06 | Discharge: 2011-11-06 | Disposition: A | Discharge status: Home / Self Care | Payer: BC Managed Care – PPO | Source: Ambulatory Visit | Attending: Cardiovascular Disease | Admitting: Cardiovascular Disease

## 2011-11-08 ENCOUNTER — Encounter (HOSPITAL_COMMUNITY)
Admission: RE | Admit: 2011-11-08 | Discharge: 2011-11-08 | Disposition: A | Discharge status: Home / Self Care | Payer: BC Managed Care – PPO | Source: Ambulatory Visit | Attending: Cardiovascular Disease | Admitting: Cardiovascular Disease

## 2011-11-08 NOTE — Progress Notes (Signed)
Manuel Bell's entry blood pressure was 144/88. Recheck blood pressure 152/88 . We had Manuel Bell to walk the track blood pressure 162/82. Upon questioning Manuel Bell thought he may  have forgot to take his medication this morning. Exercise stopped. Repeat blood pressure 152/80.  Manuel Bell went home to see if he forgot his medication.  Manuel Bell called back he forgot to take his medication today. Manuel Bell plans to return to exercise on Monday. Manuel Bell took his medication at home.

## 2011-11-11 ENCOUNTER — Encounter (HOSPITAL_COMMUNITY)
Admission: RE | Admit: 2011-11-11 | Discharge: 2011-11-11 | Disposition: A | Discharge status: Home / Self Care | Payer: BC Managed Care – PPO | Source: Ambulatory Visit | Attending: Cardiology | Admitting: Cardiology

## 2011-11-11 NOTE — Progress Notes (Signed)
Manuel Bell continues to have intermittent resting and exertional blood pressure elevations at cardiac rehab. Blood pressure 154/90 on the airdyne today. Manuel Bell walked the track for the rest of his exercise sessions.  Exit blood pressure 140/84. . Will send exercise flow sheets for review via media manager for Dr Antoine Poche to review.

## 2011-11-12 ENCOUNTER — Other Ambulatory Visit: Payer: Self-pay | Admitting: Internal Medicine

## 2011-11-12 ENCOUNTER — Other Ambulatory Visit: Payer: Self-pay | Admitting: Physician Assistant

## 2011-11-13 ENCOUNTER — Other Ambulatory Visit: Payer: Self-pay | Admitting: Cardiology

## 2011-11-13 ENCOUNTER — Encounter (HOSPITAL_COMMUNITY)
Admission: RE | Admit: 2011-11-13 | Discharge: 2011-11-13 | Disposition: A | Discharge status: Home / Self Care | Payer: BC Managed Care – PPO | Source: Ambulatory Visit | Attending: Cardiology | Admitting: Cardiology

## 2011-11-14 ENCOUNTER — Other Ambulatory Visit: Payer: Self-pay | Admitting: Cardiology

## 2011-11-14 ENCOUNTER — Telehealth: Payer: Self-pay

## 2011-11-14 NOTE — Telephone Encounter (Signed)
Called to check on refills for metoprolol

## 2011-11-14 NOTE — Telephone Encounter (Signed)
Refill- Metoprolol    [facility]ified [facility] [address] [place], [place]

## 2011-11-14 NOTE — Telephone Encounter (Signed)
let patient know that refills was ready at pharmacy

## 2011-11-15 ENCOUNTER — Encounter (HOSPITAL_COMMUNITY)
Admission: RE | Admit: 2011-11-15 | Discharge: 2011-11-15 | Disposition: A | Discharge status: Home / Self Care | Payer: BC Managed Care – PPO | Source: Ambulatory Visit | Attending: Cardiology | Admitting: Cardiology

## 2011-11-18 ENCOUNTER — Encounter (HOSPITAL_COMMUNITY): Payer: BC Managed Care – PPO

## 2011-11-19 ENCOUNTER — Encounter: Payer: BC Managed Care – PPO | Admitting: Internal Medicine

## 2011-11-20 ENCOUNTER — Encounter (HOSPITAL_COMMUNITY)
Admission: RE | Admit: 2011-11-20 | Discharge: 2011-11-20 | Disposition: A | Discharge status: Home / Self Care | Payer: BC Managed Care – PPO | Source: Ambulatory Visit | Attending: Cardiovascular Disease | Admitting: Cardiovascular Disease

## 2011-11-21 ENCOUNTER — Ambulatory Visit (INDEPENDENT_AMBULATORY_CARE_PROVIDER_SITE_OTHER): Payer: BC Managed Care – PPO | Admitting: Cardiology

## 2011-11-21 ENCOUNTER — Encounter: Payer: Self-pay | Admitting: *Deleted

## 2011-11-21 ENCOUNTER — Encounter: Payer: Self-pay | Admitting: Cardiology

## 2011-11-21 VITALS — BP 150/90 | HR 60 | Ht 70.0 in | Wt 176.8 lb

## 2011-11-21 DIAGNOSIS — E785 Hyperlipidemia, unspecified: Secondary | ICD-10-CM

## 2011-11-21 DIAGNOSIS — Z951 Presence of aortocoronary bypass graft: Secondary | ICD-10-CM

## 2011-11-21 DIAGNOSIS — E7800 Pure hypercholesterolemia, unspecified: Secondary | ICD-10-CM

## 2011-11-21 DIAGNOSIS — I10 Essential (primary) hypertension: Secondary | ICD-10-CM

## 2011-11-21 MED ORDER — PRAVASTATIN SODIUM 40 MG PO TABS: 40.0000 mg | ORAL_TABLET | Freq: Every evening | ORAL | Status: DC

## 2011-11-21 NOTE — Progress Notes (Signed)
   HPI The patient returns after bypass surgery.  This is his second office visit since surgery. He continues to recover nicely. He is participating in cardiac rehabilitation. He has had some labile blood pressures. The patient denies any new symptoms such as chest discomfort, neck or arm discomfort. There has been no new shortness of breath, PND or orthopnea. There have been no reported palpitations, presyncope or syncope.  He does have a little numbness at his saphenous vein graft harvest site and his sternal incision site.  No Known Allergies  Current Outpatient Prescriptions  Medication Sig Dispense Refill  . aspirin 325 MG EC tablet Take 1 tablet (325 mg total) by mouth daily.      . lansoprazole (PREVACID) 30 MG capsule TAKE ONE CAPSULE BY MOUTH EVERY DAY  90 capsule  1  . lisinopril (PRINIVIL,ZESTRIL) 10 MG tablet Take 1 tablet (10 mg total) by mouth daily.  90 tablet  3  . metoprolol (LOPRESSOR) 100 MG tablet Take 100 mg po two times daily.  180 tablet  3  . DISCONTD: amLODipine (NORVASC) 10 MG tablet Take 1 tablet (10 mg total) by mouth daily.  90 tablet  0  . DISCONTD: carvedilol (COREG) 25 MG tablet Take 1 tablet (25 mg total) by mouth 2 (two) times daily with a meal.  60 tablet  3  . DISCONTD: nitroGLYCERIN (NITROSTAT) 0.4 MG SL tablet Place 1 tablet (0.4 mg total) under the tongue every 5 (five) minutes as needed for chest pain (no more than 3 tabs a day).  20 tablet  0    Past Medical History  Diagnosis Date  . HTN (hypertension)   . Hepatitis C     s/p interferon/[facility] Rx; chronically elevated LFTs  . Hyperlipidemia   . CAD (coronary artery disease)     LHC 09/11/11: 3 vessel CAD, EF 45-50%  . Hx of CABG 08/2011    Dr. Donata Clay - LIMA to LAD, SVG-D1, SVG-distal RCA, SVG-RI/distal circumflex  . Postoperative atrial fibrillation 08/2011    amiodarone    Past Surgical History  Procedure Date  . Orchiectomy     left  . Tympanic membranes   . Arthroscopic knee surgery       left  . Coronary artery bypass graft 09/12/2011    Procedure: CORONARY ARTERY BYPASS GRAFTING (CABG);  Surgeon: Kerin Perna, MD;  Location: [facility] OR;  Service: Open Heart Surgery;  Laterality: N/A;   ROS:  As stated in the HPI and negative for all other systems.  PHYSICAL EXAM BP 150/90  Pulse 60  Ht 5\' 10"  (1.778 m)  Wt 176 lb 12.8 oz (80.196 kg)  BMI 25.37 kg/m2 GENERAL:  Well appearing HEENT:  Pupils equal round and reactive, fundi not visualized, oral mucosa unremarkable NECK:  No jugular venous distention, waveform within normal limits, carotid upstroke brisk and symmetric, no bruits, no thyromegaly LYMPHATICS:  No cervical, inguinal adenopathy LUNGS:  Clear to auscultation bilaterally BACK:  No CVA tenderness CHEST:  Well healed sternotomy scar. HEART:  PMI not displaced or sustained,S1 and S2 within normal limits, no S3, no S4, no clicks, no rubs, no murmurs ABD:  Flat, positive bowel sounds normal in frequency in pitch, no bruits, no rebound, no guarding, no midline pulsatile mass, no hepatomegaly, no splenomegaly EXT:  2 plus pulses throughout, no edema, no cyanosis no clubbing SKIN:  No rashes no nodules   ASSESSMENT AND PLAN

## 2011-11-21 NOTE — Assessment & Plan Note (Signed)
His blood pressure is slightly labile. I will not make an adjustment but he should keep a blood pressure diary and I may need med titration.

## 2011-11-21 NOTE — Assessment & Plan Note (Signed)
His last LDL was above 100. Given this and his coronary disease I will start pravastatin 40 mg and repeat a lipid profile in 4 months when he returns to see me.

## 2011-11-21 NOTE — Patient Instructions (Signed)
Please start Pravastatin 40 mg a day Continue all other medications as listed  Follow up with Dr Antoine Poche in 4 months. Please have fasting blood work before that appointment. (lipid)

## 2011-11-21 NOTE — Assessment & Plan Note (Signed)
He has recovered nicely and he will continue with risk reduction. He can return to work.

## 2011-11-22 ENCOUNTER — Encounter (HOSPITAL_COMMUNITY)
Admission: RE | Admit: 2011-11-22 | Discharge: 2011-11-22 | Disposition: A | Discharge status: Home / Self Care | Payer: BC Managed Care – PPO | Source: Ambulatory Visit | Attending: Cardiovascular Disease | Admitting: Cardiovascular Disease

## 2011-11-25 ENCOUNTER — Encounter (HOSPITAL_COMMUNITY): Payer: BC Managed Care – PPO

## 2011-11-25 ENCOUNTER — Encounter (HOSPITAL_COMMUNITY)
Admission: RE | Admit: 2011-11-25 | Discharge: 2011-11-25 | Disposition: A | Discharge status: Home / Self Care | Payer: BC Managed Care – PPO | Source: Ambulatory Visit | Attending: Cardiology | Admitting: Cardiology

## 2011-11-25 DIAGNOSIS — Z8249 Family history of ischemic heart disease and other diseases of the circulatory system: Secondary | ICD-10-CM | POA: Insufficient documentation

## 2011-11-25 DIAGNOSIS — Z79899 Other long term (current) drug therapy: Secondary | ICD-10-CM | POA: Insufficient documentation

## 2011-11-25 DIAGNOSIS — I2582 Chronic total occlusion of coronary artery: Secondary | ICD-10-CM | POA: Insufficient documentation

## 2011-11-25 DIAGNOSIS — E785 Hyperlipidemia, unspecified: Secondary | ICD-10-CM | POA: Insufficient documentation

## 2011-11-25 DIAGNOSIS — Z5189 Encounter for other specified aftercare: Secondary | ICD-10-CM | POA: Insufficient documentation

## 2011-11-25 DIAGNOSIS — Z87891 Personal history of nicotine dependence: Secondary | ICD-10-CM | POA: Insufficient documentation

## 2011-11-25 DIAGNOSIS — I200 Unstable angina: Secondary | ICD-10-CM | POA: Insufficient documentation

## 2011-11-25 DIAGNOSIS — Z951 Presence of aortocoronary bypass graft: Secondary | ICD-10-CM | POA: Insufficient documentation

## 2011-11-25 DIAGNOSIS — I10 Essential (primary) hypertension: Secondary | ICD-10-CM | POA: Insufficient documentation

## 2011-11-25 DIAGNOSIS — I4891 Unspecified atrial fibrillation: Secondary | ICD-10-CM | POA: Insufficient documentation

## 2011-11-25 DIAGNOSIS — Z7982 Long term (current) use of aspirin: Secondary | ICD-10-CM | POA: Insufficient documentation

## 2011-11-25 DIAGNOSIS — B1920 Unspecified viral hepatitis C without hepatic coma: Secondary | ICD-10-CM | POA: Insufficient documentation

## 2011-11-25 DIAGNOSIS — I2510 Atherosclerotic heart disease of native coronary artery without angina pectoris: Secondary | ICD-10-CM | POA: Insufficient documentation

## 2011-11-26 ENCOUNTER — Telehealth: Payer: Self-pay | Admitting: Cardiology

## 2011-11-26 NOTE — Telephone Encounter (Signed)
LMTCB

## 2011-11-26 NOTE — Telephone Encounter (Signed)
New msg Pt is scheduled for dental work in July. He needs antibiotics before appt. [facility] [address]

## 2011-11-26 NOTE — Telephone Encounter (Signed)
Fu call °Patient returning your call °

## 2011-11-26 NOTE — Telephone Encounter (Signed)
Spoke with pt. Pt states he has an appt to have his teeth cleaned in July. He states he was told by the dentist's office he would need to take antibiotics prior to that appt. I will forward to Dr Antoine Poche for review and recommendations.

## 2011-11-27 ENCOUNTER — Encounter (HOSPITAL_COMMUNITY)
Admission: RE | Admit: 2011-11-27 | Discharge: 2011-11-27 | Disposition: A | Discharge status: Home / Self Care | Payer: BC Managed Care – PPO | Source: Ambulatory Visit | Attending: Cardiology | Admitting: Cardiology

## 2011-11-27 ENCOUNTER — Encounter (HOSPITAL_COMMUNITY): Payer: BC Managed Care – PPO

## 2011-11-27 NOTE — Telephone Encounter (Signed)
No antibiotics needed.

## 2011-11-28 NOTE — Telephone Encounter (Signed)
Pt aware.

## 2011-11-29 ENCOUNTER — Encounter (HOSPITAL_COMMUNITY)
Admission: RE | Admit: 2011-11-29 | Discharge: 2011-11-29 | Disposition: A | Discharge status: Home / Self Care | Payer: BC Managed Care – PPO | Source: Ambulatory Visit | Attending: Cardiology | Admitting: Cardiology

## 2011-11-29 ENCOUNTER — Encounter (HOSPITAL_COMMUNITY): Payer: BC Managed Care – PPO

## 2011-12-02 ENCOUNTER — Encounter (HOSPITAL_COMMUNITY)
Admission: RE | Admit: 2011-12-02 | Discharge: 2011-12-02 | Disposition: A | Discharge status: Home / Self Care | Payer: BC Managed Care – PPO | Source: Ambulatory Visit | Attending: Cardiology | Admitting: Cardiology

## 2011-12-02 ENCOUNTER — Encounter (HOSPITAL_COMMUNITY): Payer: BC Managed Care – PPO

## 2011-12-04 ENCOUNTER — Encounter (HOSPITAL_COMMUNITY)
Admission: RE | Admit: 2011-12-04 | Discharge: 2011-12-04 | Disposition: A | Discharge status: Home / Self Care | Payer: BC Managed Care – PPO | Source: Ambulatory Visit | Attending: Cardiology | Admitting: Cardiology

## 2011-12-04 ENCOUNTER — Encounter (HOSPITAL_COMMUNITY): Payer: BC Managed Care – PPO

## 2011-12-06 ENCOUNTER — Encounter (HOSPITAL_COMMUNITY)
Admission: RE | Admit: 2011-12-06 | Discharge: 2011-12-06 | Disposition: A | Discharge status: Home / Self Care | Payer: BC Managed Care – PPO | Source: Ambulatory Visit | Attending: Cardiology | Admitting: Cardiology

## 2011-12-06 ENCOUNTER — Encounter (HOSPITAL_COMMUNITY): Payer: BC Managed Care – PPO

## 2011-12-09 ENCOUNTER — Encounter (HOSPITAL_COMMUNITY)
Admission: RE | Admit: 2011-12-09 | Discharge: 2011-12-09 | Disposition: A | Discharge status: Home / Self Care | Payer: BC Managed Care – PPO | Source: Ambulatory Visit | Attending: Cardiology | Admitting: Cardiology

## 2011-12-09 ENCOUNTER — Encounter (HOSPITAL_COMMUNITY): Payer: BC Managed Care – PPO

## 2011-12-11 ENCOUNTER — Encounter (HOSPITAL_COMMUNITY)
Admission: RE | Admit: 2011-12-11 | Discharge: 2011-12-11 | Disposition: A | Discharge status: Home / Self Care | Payer: BC Managed Care – PPO | Source: Ambulatory Visit | Attending: Cardiology | Admitting: Cardiology

## 2011-12-11 ENCOUNTER — Encounter (HOSPITAL_COMMUNITY): Payer: BC Managed Care – PPO

## 2011-12-13 ENCOUNTER — Encounter (HOSPITAL_COMMUNITY): Payer: BC Managed Care – PPO

## 2011-12-13 ENCOUNTER — Encounter (HOSPITAL_COMMUNITY)
Admission: RE | Admit: 2011-12-13 | Discharge: 2011-12-13 | Disposition: A | Discharge status: Home / Self Care | Payer: BC Managed Care – PPO | Source: Ambulatory Visit | Attending: Cardiology | Admitting: Cardiology

## 2011-12-16 ENCOUNTER — Encounter (HOSPITAL_COMMUNITY): Payer: BC Managed Care – PPO

## 2011-12-16 ENCOUNTER — Encounter (HOSPITAL_COMMUNITY)
Admission: RE | Admit: 2011-12-16 | Discharge: 2011-12-16 | Disposition: A | Discharge status: Home / Self Care | Payer: BC Managed Care – PPO | Source: Ambulatory Visit | Attending: Cardiology | Admitting: Cardiology

## 2011-12-18 ENCOUNTER — Encounter (HOSPITAL_COMMUNITY)
Admission: RE | Admit: 2011-12-18 | Discharge: 2011-12-18 | Disposition: A | Discharge status: Home / Self Care | Payer: BC Managed Care – PPO | Source: Ambulatory Visit | Attending: Cardiology | Admitting: Cardiology

## 2011-12-18 ENCOUNTER — Encounter (HOSPITAL_COMMUNITY): Payer: BC Managed Care – PPO

## 2011-12-20 ENCOUNTER — Encounter (HOSPITAL_COMMUNITY): Payer: BC Managed Care – PPO

## 2011-12-20 ENCOUNTER — Encounter (HOSPITAL_COMMUNITY)
Admission: RE | Admit: 2011-12-20 | Discharge: 2011-12-20 | Disposition: A | Discharge status: Home / Self Care | Payer: BC Managed Care – PPO | Source: Ambulatory Visit | Attending: Cardiovascular Disease | Admitting: Cardiovascular Disease

## 2011-12-20 DIAGNOSIS — I2582 Chronic total occlusion of coronary artery: Secondary | ICD-10-CM | POA: Insufficient documentation

## 2011-12-20 DIAGNOSIS — Z951 Presence of aortocoronary bypass graft: Secondary | ICD-10-CM | POA: Insufficient documentation

## 2011-12-20 DIAGNOSIS — I2510 Atherosclerotic heart disease of native coronary artery without angina pectoris: Secondary | ICD-10-CM | POA: Insufficient documentation

## 2011-12-20 DIAGNOSIS — I200 Unstable angina: Secondary | ICD-10-CM | POA: Insufficient documentation

## 2011-12-20 DIAGNOSIS — I10 Essential (primary) hypertension: Secondary | ICD-10-CM | POA: Insufficient documentation

## 2011-12-20 DIAGNOSIS — B1920 Unspecified viral hepatitis C without hepatic coma: Secondary | ICD-10-CM | POA: Insufficient documentation

## 2011-12-20 DIAGNOSIS — I4891 Unspecified atrial fibrillation: Secondary | ICD-10-CM | POA: Insufficient documentation

## 2011-12-20 DIAGNOSIS — Z79899 Other long term (current) drug therapy: Secondary | ICD-10-CM | POA: Insufficient documentation

## 2011-12-20 DIAGNOSIS — Z5189 Encounter for other specified aftercare: Secondary | ICD-10-CM | POA: Insufficient documentation

## 2011-12-20 DIAGNOSIS — Z7982 Long term (current) use of aspirin: Secondary | ICD-10-CM | POA: Insufficient documentation

## 2011-12-20 DIAGNOSIS — Z8249 Family history of ischemic heart disease and other diseases of the circulatory system: Secondary | ICD-10-CM | POA: Insufficient documentation

## 2011-12-20 DIAGNOSIS — Z87891 Personal history of nicotine dependence: Secondary | ICD-10-CM | POA: Insufficient documentation

## 2011-12-20 DIAGNOSIS — E785 Hyperlipidemia, unspecified: Secondary | ICD-10-CM | POA: Insufficient documentation

## 2011-12-23 ENCOUNTER — Encounter (HOSPITAL_COMMUNITY): Payer: BC Managed Care – PPO

## 2011-12-23 ENCOUNTER — Encounter (HOSPITAL_COMMUNITY)
Admission: RE | Admit: 2011-12-23 | Discharge: 2011-12-23 | Disposition: A | Discharge status: Home / Self Care | Payer: BC Managed Care – PPO | Source: Ambulatory Visit | Attending: Cardiology | Admitting: Cardiology

## 2011-12-23 DIAGNOSIS — Z951 Presence of aortocoronary bypass graft: Secondary | ICD-10-CM | POA: Insufficient documentation

## 2011-12-23 DIAGNOSIS — Z5189 Encounter for other specified aftercare: Secondary | ICD-10-CM | POA: Insufficient documentation

## 2011-12-23 DIAGNOSIS — Z7982 Long term (current) use of aspirin: Secondary | ICD-10-CM | POA: Insufficient documentation

## 2011-12-23 DIAGNOSIS — I2582 Chronic total occlusion of coronary artery: Secondary | ICD-10-CM | POA: Insufficient documentation

## 2011-12-23 DIAGNOSIS — Z8249 Family history of ischemic heart disease and other diseases of the circulatory system: Secondary | ICD-10-CM | POA: Insufficient documentation

## 2011-12-23 DIAGNOSIS — B1920 Unspecified viral hepatitis C without hepatic coma: Secondary | ICD-10-CM | POA: Insufficient documentation

## 2011-12-23 DIAGNOSIS — Z79899 Other long term (current) drug therapy: Secondary | ICD-10-CM | POA: Insufficient documentation

## 2011-12-23 DIAGNOSIS — I200 Unstable angina: Secondary | ICD-10-CM | POA: Insufficient documentation

## 2011-12-23 DIAGNOSIS — E785 Hyperlipidemia, unspecified: Secondary | ICD-10-CM | POA: Insufficient documentation

## 2011-12-23 DIAGNOSIS — Z87891 Personal history of nicotine dependence: Secondary | ICD-10-CM | POA: Insufficient documentation

## 2011-12-23 DIAGNOSIS — I4891 Unspecified atrial fibrillation: Secondary | ICD-10-CM | POA: Insufficient documentation

## 2011-12-23 DIAGNOSIS — I10 Essential (primary) hypertension: Secondary | ICD-10-CM | POA: Insufficient documentation

## 2011-12-23 DIAGNOSIS — I2510 Atherosclerotic heart disease of native coronary artery without angina pectoris: Secondary | ICD-10-CM | POA: Insufficient documentation

## 2011-12-25 ENCOUNTER — Encounter (HOSPITAL_COMMUNITY)
Admission: RE | Admit: 2011-12-25 | Discharge: 2011-12-25 | Disposition: A | Discharge status: Home / Self Care | Payer: BC Managed Care – PPO | Source: Ambulatory Visit | Attending: Cardiology | Admitting: Cardiology

## 2011-12-25 ENCOUNTER — Encounter (HOSPITAL_COMMUNITY): Payer: BC Managed Care – PPO

## 2011-12-27 ENCOUNTER — Encounter (HOSPITAL_COMMUNITY): Payer: BC Managed Care – PPO

## 2011-12-27 ENCOUNTER — Encounter (HOSPITAL_COMMUNITY)
Admission: RE | Admit: 2011-12-27 | Discharge: 2011-12-27 | Disposition: A | Discharge status: Home / Self Care | Payer: BC Managed Care – PPO | Source: Ambulatory Visit | Attending: Cardiology | Admitting: Cardiology

## 2011-12-30 ENCOUNTER — Encounter (HOSPITAL_COMMUNITY)
Admission: RE | Admit: 2011-12-30 | Discharge: 2011-12-30 | Disposition: A | Discharge status: Home / Self Care | Payer: BC Managed Care – PPO | Source: Ambulatory Visit | Attending: Cardiology | Admitting: Cardiology

## 2011-12-30 ENCOUNTER — Encounter (HOSPITAL_COMMUNITY): Payer: BC Managed Care – PPO

## 2012-01-01 ENCOUNTER — Encounter (HOSPITAL_COMMUNITY): Payer: BC Managed Care – PPO

## 2012-01-01 ENCOUNTER — Encounter (HOSPITAL_COMMUNITY)
Admission: RE | Admit: 2012-01-01 | Discharge: 2012-01-01 | Disposition: A | Discharge status: Home / Self Care | Payer: BC Managed Care – PPO | Source: Ambulatory Visit | Attending: Cardiology | Admitting: Cardiology

## 2012-01-03 ENCOUNTER — Encounter (HOSPITAL_COMMUNITY)
Admission: RE | Admit: 2012-01-03 | Discharge: 2012-01-03 | Disposition: A | Discharge status: Home / Self Care | Payer: BC Managed Care – PPO | Source: Ambulatory Visit | Attending: Cardiology | Admitting: Cardiology

## 2012-01-03 ENCOUNTER — Encounter (HOSPITAL_COMMUNITY): Payer: BC Managed Care – PPO

## 2012-01-06 ENCOUNTER — Encounter (HOSPITAL_COMMUNITY): Payer: BC Managed Care – PPO

## 2012-01-06 ENCOUNTER — Encounter (HOSPITAL_COMMUNITY)
Admission: RE | Admit: 2012-01-06 | Discharge: 2012-01-06 | Disposition: A | Discharge status: Home / Self Care | Payer: BC Managed Care – PPO | Source: Ambulatory Visit | Attending: Cardiology | Admitting: Cardiology

## 2012-01-07 ENCOUNTER — Ambulatory Visit (INDEPENDENT_AMBULATORY_CARE_PROVIDER_SITE_OTHER): Payer: BC Managed Care – PPO | Admitting: Internal Medicine

## 2012-01-07 ENCOUNTER — Encounter: Payer: Self-pay | Admitting: Internal Medicine

## 2012-01-07 VITALS — BP 130/78 | HR 59 | Temp 98.8°F | Resp 12 | Ht 69.0 in | Wt 177.0 lb

## 2012-01-07 DIAGNOSIS — R7309 Other abnormal glucose: Secondary | ICD-10-CM

## 2012-01-07 DIAGNOSIS — Z951 Presence of aortocoronary bypass graft: Secondary | ICD-10-CM

## 2012-01-07 DIAGNOSIS — I10 Essential (primary) hypertension: Secondary | ICD-10-CM

## 2012-01-07 DIAGNOSIS — Z0000 Encounter for general adult medical examination without abnormal findings: Secondary | ICD-10-CM

## 2012-01-07 DIAGNOSIS — E785 Hyperlipidemia, unspecified: Secondary | ICD-10-CM

## 2012-01-07 MED ORDER — ACYCLOVIR 5 % EX OINT: TOPICAL_OINTMENT | CUTANEOUS | Status: AC

## 2012-01-07 NOTE — Progress Notes (Signed)
Manuel Bell has an appointment with Dr Alwyn Ren today at 1 pm.  Faxed exercise flow sheets to Dr Frederik Pear office for review

## 2012-01-07 NOTE — Patient Instructions (Addendum)
Please  schedule fasting Labs : BMET,Lipids, hepatic panel, A1c, TSH.PLEASE BRING THESE INSTRUCTIONS TO FOLLOW UP  LAB APPOINTMENT.This will guarantee correct labs are drawn, eliminating need for repeat blood sampling ( needle sticks ! ). Diagnoses /Codes: V70.0. Please try to go on My Chart within the next 24 hours to allow me to release the results directly to you.

## 2012-01-07 NOTE — Progress Notes (Signed)
  Subjective:    Patient ID: Manuel Bell, male    DOB: 1956-01-16, 56 y.o.   MRN: [id number]  HPI  Mr Sgro  is here for a physical;acute issues include some fatigue & numbness @ op sites      Review of Systems HYPERTENSION: Disease Monitoring: Blood pressure range-BP variable @ Rehab ; 120/70-160/88 Chest pain, palpitations- rare palpitations      Dyspnea- only on bike initially Medications: Compliance- yes  Lightheadedness,Syncope-no    Edema- no  FASTING HYPERGLYCEMIA, PMH of:  Blood Sugar ranges-103-116 as IP Polyuria/phagia/dipsia- no       Visual problems- no  HYPERLIPIDEMIA: Disease Monitoring: See symptoms for Hypertension Medications: Compliance- on Pravastatin 40 mg qhs since initial post discharge Cardilogy appt  Abd pain, bowel changes-no; he has no urine or stool color change   Muscle aches-minor intermittently         Objective:   Physical Exam Gen.:  well-nourished in appearance. Alert, appropriate and cooperative throughout exam. Head: Normocephalic without obvious abnormalities; pattern alopecia  Eyes: No corneal or conjunctival inflammation noted. Pupils equal round reactive to light and accommodation. Fundal exam is benign without hemorrhages, exudate, papilledema. Extraocular motion intact. Vision grossly normal. Ears: External  ear exam reveals no significant lesions or deformities. Canals clear .TMs normal. Hearing is grossly normal bilaterally. Nose: External nasal exam reveals no deformity or inflammation. Nasal mucosa are pink and moist. No lesions or exudates noted.  Mouth: Oral mucosa and oropharynx reveal no lesions or exudates. Teeth in good repair. Neck: No deformities, masses, or tenderness noted. Range of motion & Thyroid normal Lungs: Normal respiratory effort; chest expands symmetrically. Lungs are clear to auscultation without rales, wheezes, or increased work of breathing. Heart: Normal rate and rhythm. Split S1 and accentuated S2. No  gallop, click, or rub. No murmur. Abdomen: Bowel sounds normal; abdomen soft and nontender. No masses, organomegaly or hernias noted. Genitalia/ DRE: Surgically absent left testicle; otherwise genitalia normal.Prostate is normal without enlargement, asymmetry, nodularity, or induration.  Musculoskeletal/extremities: No deformity or scoliosis noted of  the thoracic or lumbar spine. No clubbing, cyanosis, edema, or deformity noted. Range of motion  normal .Tone & strength  normal.Joints normal. Nail health  good. Vascular: Carotid, radial artery, dorsalis pedis and  posterior tibial pulses are full and equal. No bruits present. Neurologic: Alert and oriented x3. Deep tendon reflexes symmetrical and normal.          Skin: Intact without suspicious lesions or rashes. Lymph: No cervical, axillary, or inguinal lymphadenopathy present. Psych: Mood and affect are normal. Normally interactive                                                                                         Assessment & Plan:  #1 comprehensive physical exam; no acute findings #2 see Problem List with Assessments & Recommendations Plan: see Orders

## 2012-01-08 ENCOUNTER — Encounter (HOSPITAL_COMMUNITY): Payer: BC Managed Care – PPO

## 2012-01-08 ENCOUNTER — Encounter (HOSPITAL_COMMUNITY)
Admission: RE | Admit: 2012-01-08 | Discharge: 2012-01-08 | Disposition: A | Discharge status: Home / Self Care | Payer: BC Managed Care – PPO | Source: Ambulatory Visit | Attending: Cardiology | Admitting: Cardiology

## 2012-01-08 NOTE — Progress Notes (Addendum)
[employer] graduates today.  Manuel Bell plans to continue exercise on his own.

## 2012-01-10 ENCOUNTER — Encounter (HOSPITAL_COMMUNITY): Payer: BC Managed Care – PPO

## 2012-01-10 ENCOUNTER — Other Ambulatory Visit (INDEPENDENT_AMBULATORY_CARE_PROVIDER_SITE_OTHER): Payer: BC Managed Care – PPO

## 2012-01-10 DIAGNOSIS — E7800 Pure hypercholesterolemia, unspecified: Secondary | ICD-10-CM

## 2012-01-10 DIAGNOSIS — I10 Essential (primary) hypertension: Secondary | ICD-10-CM

## 2012-01-10 DIAGNOSIS — E785 Hyperlipidemia, unspecified: Secondary | ICD-10-CM

## 2012-01-10 LAB — LIPID PANEL: Cholesterol: 168 mg/dL (ref 0–200)

## 2012-01-10 LAB — HEMOGLOBIN A1C: Hgb A1c MFr Bld: 5.8 % (ref 4.6–6.5)

## 2012-01-10 LAB — BASIC METABOLIC PANEL
BUN: 12 mg/dL (ref 6–23)
Chloride: 102 mEq/L (ref 96–112)
Glucose, Bld: 100 mg/dL — ABNORMAL HIGH (ref 70–99)
Potassium: 4.4 mEq/L (ref 3.5–5.1)
Sodium: 139 mEq/L (ref 135–145)

## 2012-01-10 LAB — HEPATIC FUNCTION PANEL
ALT: 155 U/L — ABNORMAL HIGH (ref 0–53)
AST: 138 U/L — ABNORMAL HIGH (ref 0–37)
Albumin: 3.9 g/dL (ref 3.5–5.2)
Alkaline Phosphatase: 59 U/L (ref 39–117)

## 2012-01-10 NOTE — Progress Notes (Signed)
Labs only

## 2012-01-13 ENCOUNTER — Encounter (HOSPITAL_COMMUNITY): Payer: BC Managed Care – PPO

## 2012-01-15 ENCOUNTER — Encounter (HOSPITAL_COMMUNITY): Payer: BC Managed Care – PPO

## 2012-01-16 ENCOUNTER — Telehealth: Payer: Self-pay | Admitting: Internal Medicine

## 2012-01-16 NOTE — Telephone Encounter (Signed)
Pt wants to know if he should get the shingles shot. I advised him to check with his insurance company to see if they will cover it. Pt states he will call the ins co., but would like to know Dr. Frederik Pear thoughts about it. Call back # [phone]

## 2012-01-16 NOTE — Telephone Encounter (Signed)
Patient aware to come in fasting for Alt and AST to be drawn

## 2012-01-16 NOTE — Telephone Encounter (Signed)
Message copied by Maurice Small on Thu Jan 16, 2012  4:15 PM ------      Message from: Pecola Lawless      Created: Wed Jan 15, 2012  1:20 PM       My Chart is not allowing release of the lab results . Please contact Mr. Barry Dienes with results as follows: Fasting glucose is minimally elevated but the A1c, the diagnostic test for diabetes is normal. There is  significant elevation in the liver function tests. This is undoubtedly due to the pre-existing hepatitis C. issues as well as the 40 mg of pravastatin. I'll ask Dr. Antoine Poche if a lower dose, more efficacious statin such as Crestor could be employed. [employer]

## 2012-01-16 NOTE — Telephone Encounter (Signed)
I discussed with patient. Patient verbalized understanding of labs, copy to be mailed. Patient also schedule appointment for nurse visit for shingles vaccine (patient states he already checked coverage with insurance company)

## 2012-01-16 NOTE — Telephone Encounter (Signed)
His Cardiologist is out of town; because of the significant elevation of liver function tests statin will be held and fasting hepatic panel repeated in the next 7-10 days.

## 2012-01-17 ENCOUNTER — Encounter (HOSPITAL_COMMUNITY): Payer: BC Managed Care – PPO

## 2012-01-27 ENCOUNTER — Other Ambulatory Visit (INDEPENDENT_AMBULATORY_CARE_PROVIDER_SITE_OTHER): Payer: BC Managed Care – PPO

## 2012-01-27 ENCOUNTER — Ambulatory Visit (INDEPENDENT_AMBULATORY_CARE_PROVIDER_SITE_OTHER): Payer: BC Managed Care – PPO

## 2012-01-27 DIAGNOSIS — R7989 Other specified abnormal findings of blood chemistry: Secondary | ICD-10-CM

## 2012-01-27 DIAGNOSIS — Z23 Encounter for immunization: Secondary | ICD-10-CM

## 2012-01-27 DIAGNOSIS — Z2911 Encounter for prophylactic immunotherapy for respiratory syncytial virus (RSV): Secondary | ICD-10-CM

## 2012-03-16 ENCOUNTER — Other Ambulatory Visit: Payer: Self-pay | Admitting: *Deleted

## 2012-03-16 DIAGNOSIS — E785 Hyperlipidemia, unspecified: Secondary | ICD-10-CM

## 2012-03-16 DIAGNOSIS — I2510 Atherosclerotic heart disease of native coronary artery without angina pectoris: Secondary | ICD-10-CM

## 2012-03-17 ENCOUNTER — Other Ambulatory Visit (INDEPENDENT_AMBULATORY_CARE_PROVIDER_SITE_OTHER): Payer: BC Managed Care – PPO

## 2012-03-17 DIAGNOSIS — E785 Hyperlipidemia, unspecified: Secondary | ICD-10-CM

## 2012-03-17 DIAGNOSIS — I2510 Atherosclerotic heart disease of native coronary artery without angina pectoris: Secondary | ICD-10-CM

## 2012-03-17 LAB — LIPID PANEL
Cholesterol: 177 mg/dL (ref 0–200)
HDL: 48.3 mg/dL (ref >39.00)
Triglycerides: 138 mg/dL (ref 0.0–149.0)

## 2012-03-19 ENCOUNTER — Encounter: Payer: Self-pay | Admitting: Cardiology

## 2012-03-19 ENCOUNTER — Ambulatory Visit (INDEPENDENT_AMBULATORY_CARE_PROVIDER_SITE_OTHER): Payer: BC Managed Care – PPO | Admitting: Cardiology

## 2012-03-19 VITALS — BP 160/100 | HR 58 | Ht 70.0 in | Wt 173.8 lb

## 2012-03-19 DIAGNOSIS — I10 Essential (primary) hypertension: Secondary | ICD-10-CM

## 2012-03-19 DIAGNOSIS — I2510 Atherosclerotic heart disease of native coronary artery without angina pectoris: Secondary | ICD-10-CM

## 2012-03-19 DIAGNOSIS — E785 Hyperlipidemia, unspecified: Secondary | ICD-10-CM

## 2012-03-19 MED ORDER — ZOLPIDEM TARTRATE 10 MG PO TABS: 10.0000 mg | ORAL_TABLET | Freq: Every evening | ORAL | Status: DC | PRN

## 2012-03-19 MED ORDER — LISINOPRIL 20 MG PO TABS: 20.0000 mg | ORAL_TABLET | Freq: Every day | ORAL | Status: DC

## 2012-03-19 NOTE — Patient Instructions (Addendum)
Please increase your Lisinopril to 20 mg. Continue all other medications as listed. You may take Ambien 10 mg at bedtime for sleep.  Follow up in 6 months with Dr Antoine Poche.  You will receive a letter in the mail 2 months before you are due.  Please call us when you receive this letter to schedule your follow up appointment.

## 2012-03-19 NOTE — Progress Notes (Signed)
HPI The patient returns after bypass surgery.  Since I last saw him he is doing well. He does have some mild insomnia. However, is otherwise well without complaints. He participated in cardiac rehabilitation. He is back at work. The patient denies any new symptoms such as chest discomfort, neck or arm discomfort. There has been no new shortness of breath, PND or orthopnea. There have been no reported palpitations, presyncope or syncope.  He continues to exercise and is watching his diet. He reports that he is drinking too much alcohol.  No Known Allergies  Current Outpatient Prescriptions  Medication Sig Dispense Refill  . acyclovir ointment (ZOVIRAX) 5 % Apply topically every 3 (three) hours.  30 g  2  . aspirin 325 MG EC tablet Take 1 tablet (325 mg total) by mouth daily.      . lansoprazole (PREVACID) 30 MG capsule TAKE ONE CAPSULE BY MOUTH EVERY DAY  90 capsule  1  . lisinopril (PRINIVIL,ZESTRIL) 10 MG tablet Take 1 tablet (10 mg total) by mouth daily.  90 tablet  3  . metoprolol (LOPRESSOR) 100 MG tablet Take 100 mg po two times daily.  180 tablet  3  . Omega-3 Fatty Acids (FISH OIL) 1200 MG CAPS Take by mouth 3 (three) times daily.      . pravastatin (PRAVACHOL) 40 MG tablet Take 1 tablet (40 mg total) by mouth every evening.  30 tablet  6  . DISCONTD: amLODipine (NORVASC) 10 MG tablet Take 1 tablet (10 mg total) by mouth daily.  90 tablet  0  . DISCONTD: carvedilol (COREG) 25 MG tablet Take 1 tablet (25 mg total) by mouth 2 (two) times daily with a meal.  60 tablet  3  . DISCONTD: nitroGLYCERIN (NITROSTAT) 0.4 MG SL tablet Place 1 tablet (0.4 mg total) under the tongue every 5 (five) minutes as needed for chest pain (no more than 3 tabs a day).  20 tablet  0    Past Medical History  Diagnosis Date  . HTN (hypertension)   . Hepatitis C     s/p interferon/[facility] Rx; chronically elevated LFTs  . Hyperlipidemia   . CAD (coronary artery disease)     LHC 09/11/11: 3 vessel CAD, EF  45-50%  . Hx of CABG 08/2011    Dr. Donata Clay - LIMA to LAD, SVG-D1, SVG-distal RCA, SVG-RI/distal circumflex  . Postoperative atrial fibrillation 08/2011    Amiodarone    Past Surgical History  Procedure Date  . Orchiectomy     left for scar tissue post torsion; Dr Marcello Fennel  . Tympanic membranes     perforation X 3 of  L  TM  ;surgically repaired  . Arthroscopic knee surgery     left  . Coronary artery bypass graft 09/12/2011    Procedure: CORONARY ARTERY BYPASS GRAFTING (CABG);  Surgeon: Kerin Perna, MD;  Location[facility] OR;  Service: Open Heart Surgery;  Laterality: N/A;   ROS:  As stated in the HPI and negative for all other systems.  PHYSICAL EXAM BP 160/100  Pulse 58  Ht 5\' 10"  (1.778 m)  Wt 173 lb 12.8 oz (78.835 kg)  BMI 24.94 kg/m2 GENERAL:  Well appearing HEENT:  Pupils equal round and reactive, fundi not visualized, oral mucosa unremarkable NECK:  No jugular venous distention, waveform within normal limits, carotid upstroke brisk and symmetric, no bruits, no thyromegaly LYMPHATICS:  No cervical, inguinal adenopathy LUNGS:  Clear to auscultation bilaterally BACK:  No CVA tenderness CHEST:  Well  healed sternotomy scar. HEART:  PMI not displaced or sustained,S1 and S2 within normal limits, no S3, no S4, no clicks, no rubs, no murmurs ABD:  Flat, positive bowel sounds normal in frequency in pitch, no bruits, no rebound, no guarding, no midline pulsatile mass, no hepatomegaly, no splenomegaly EXT:  2 plus pulses throughout, no edema, no cyanosis no clubbing SKIN:  No rashes no nodules  EKG:  Sinus rhythm, rate 58, axis within normal limits, intervals within normal limits, no acute ST-T wave changes.   ASSESSMENT AND PLAN  CABG  -  The patient continues to do well status post bypass. He is participating aggressive risk reduction. Meds will be changed as below.  HYPERTENSION - His blood pressure has been elevated at home as it is today. I will increase his lisinopril  to 20 mg daily.  HYPERLIPIDEMIA -  His LDL yesterday was 101. HDL greater than 40. He will remain on the meds as listed.   INSOMNIA - I will give him a prescription (limited) for Ambien  ALCOHOL - I suggested complete abstinence.

## 2012-03-23 ENCOUNTER — Encounter: Payer: Self-pay | Admitting: *Deleted

## 2012-04-03 ENCOUNTER — Telehealth: Payer: Self-pay | Admitting: Cardiology

## 2012-04-03 DIAGNOSIS — I10 Essential (primary) hypertension: Secondary | ICD-10-CM

## 2012-04-03 MED ORDER — LISINOPRIL 40 MG PO TABS: 40.0000 mg | ORAL_TABLET | Freq: Every day | ORAL | Status: DC

## 2012-04-03 NOTE — Telephone Encounter (Signed)
Pt is aware of orders and is agreeable.  He does not want a new RX sent in at this time and will call back when he needs it.  He will come into the office 10/25 for repeat BMP

## 2012-04-03 NOTE — Telephone Encounter (Signed)
Pt reports that BP remains elevated even after the increase in his Lisinopril on 9/26.  Today his BP was 154/84 and 160/92.  He reports that it is usually 150-160/90.  Pt would liketo know what further treatment he needs.  Will review with Dr Antoine Poche and call pt back with any orders.  Pt is in agreement.

## 2012-04-03 NOTE — Telephone Encounter (Signed)
Pt still having problem with BP even after med increase, pls advise

## 2012-04-03 NOTE — Telephone Encounter (Signed)
Increase the lisinopril to 40 mg daily and repeat a BMET  In two weeks.

## 2012-04-17 ENCOUNTER — Ambulatory Visit (INDEPENDENT_AMBULATORY_CARE_PROVIDER_SITE_OTHER): Payer: BC Managed Care – PPO | Admitting: *Deleted

## 2012-04-17 ENCOUNTER — Telehealth: Payer: Self-pay | Admitting: *Deleted

## 2012-04-17 DIAGNOSIS — Z79899 Other long term (current) drug therapy: Secondary | ICD-10-CM

## 2012-04-17 DIAGNOSIS — I10 Essential (primary) hypertension: Secondary | ICD-10-CM

## 2012-04-17 LAB — BASIC METABOLIC PANEL
BUN: 14 mg/dL (ref 6–23)
CO2: 31 mEq/L (ref 19–32)
Calcium: 8.7 mg/dL (ref 8.4–10.5)
Creatinine, Ser: 0.8 mg/dL (ref 0.4–1.5)
Glucose, Bld: 99 mg/dL (ref 70–99)
Sodium: 138 mEq/L (ref 135–145)

## 2012-04-17 MED ORDER — HYDROCHLOROTHIAZIDE 25 MG PO TABS: 25.0000 mg | ORAL_TABLET | Freq: Every day | ORAL | Status: DC

## 2012-04-17 NOTE — Telephone Encounter (Signed)
Pt aware of orders - appointment scheduled for repeat lab work.

## 2012-04-17 NOTE — Telephone Encounter (Signed)
Pt here for blood work following an increase in Lisinopril to 40 mg daily for better BP control.  BP recordings from home have beem - 159/94, 149/100, 168/99, 159/105, 193/107, 174/83, 186/103, 191/96, 180/99, 177/102 and today manually checked here was 174/88.  He has taken his medications as listed.  Pt aware this information will be reviewed and I will call back with new orders.

## 2012-04-17 NOTE — Telephone Encounter (Signed)
I would like to add HCTZ to his meds and repeat a BMET in two weeks.  He can keep the BP diary.  (Disp number 90 with 3 refills).

## 2012-05-01 ENCOUNTER — Other Ambulatory Visit (INDEPENDENT_AMBULATORY_CARE_PROVIDER_SITE_OTHER): Payer: BC Managed Care – PPO

## 2012-05-01 DIAGNOSIS — I10 Essential (primary) hypertension: Secondary | ICD-10-CM

## 2012-05-01 DIAGNOSIS — Z79899 Other long term (current) drug therapy: Secondary | ICD-10-CM

## 2012-05-01 LAB — BASIC METABOLIC PANEL
Glucose, Bld: 147 mg/dL — ABNORMAL HIGH (ref 70–99)
Potassium: 3.9 mEq/L (ref 3.5–5.1)
Sodium: 137 mEq/L (ref 135–145)

## 2012-05-05 ENCOUNTER — Telehealth: Payer: Self-pay

## 2012-05-05 NOTE — Telephone Encounter (Signed)
Message copied by Yolonda Kida on Tue May 05, 2012  4:04 PM ------      Message from: Rollene Rotunda      Created: Sat May 02, 2012 12:57 AM       Labs OK.  Continue current therapy.

## 2012-05-05 NOTE — Telephone Encounter (Signed)
Patient aware of labs.  

## 2012-05-18 ENCOUNTER — Encounter: Payer: Self-pay | Admitting: Cardiology

## 2012-05-18 ENCOUNTER — Ambulatory Visit (INDEPENDENT_AMBULATORY_CARE_PROVIDER_SITE_OTHER): Payer: BC Managed Care – PPO | Admitting: Cardiology

## 2012-05-18 VITALS — BP 140/86 | HR 69 | Ht 69.0 in | Wt 174.0 lb

## 2012-05-18 DIAGNOSIS — I2510 Atherosclerotic heart disease of native coronary artery without angina pectoris: Secondary | ICD-10-CM

## 2012-05-18 DIAGNOSIS — I10 Essential (primary) hypertension: Secondary | ICD-10-CM

## 2012-05-18 DIAGNOSIS — E785 Hyperlipidemia, unspecified: Secondary | ICD-10-CM

## 2012-05-18 DIAGNOSIS — Z951 Presence of aortocoronary bypass graft: Secondary | ICD-10-CM

## 2012-05-18 MED ORDER — LISINOPRIL 20 MG PO TABS: 40.0000 mg | ORAL_TABLET | Freq: Two times a day (BID) | ORAL | Status: DC

## 2012-05-18 MED ORDER — ASPIRIN 81 MG PO TBEC: 81.0000 mg | DELAYED_RELEASE_TABLET | Freq: Every day | ORAL | Status: AC

## 2012-05-18 MED ORDER — LISINOPRIL 20 MG PO TABS: 20.0000 mg | ORAL_TABLET | Freq: Two times a day (BID) | ORAL | Status: DC

## 2012-05-18 NOTE — Progress Notes (Signed)
HPI The patient returns after bypass surgery earlier this year.  Since I last saw him he is doing well.  However, is otherwise well without complaints. He is walking at work.  The patient denies any new symptoms such as chest discomfort, neck or arm discomfort. There has been no new shortness of breath, PND or orthopnea. There have been no reported palpitations, presyncope or syncope.  He continues to exercise and is watching his diet. He reports that he is still by his own report drinking too much alcohol.  No Known Allergies  Current Outpatient Prescriptions  Medication Sig Dispense Refill  . acyclovir ointment (ZOVIRAX) 5 % Apply topically every 3 (three) hours.  30 g  2  . aspirin 325 MG EC tablet Take 1 tablet (325 mg total) by mouth daily.      . hydrochlorothiazide (HYDRODIURIL) 25 MG tablet Take 1 tablet (25 mg total) by mouth daily.  30 tablet  6  . lansoprazole (PREVACID) 30 MG capsule TAKE ONE CAPSULE BY MOUTH EVERY DAY  90 capsule  1  . lisinopril (PRINIVIL,ZESTRIL) 40 MG tablet Take 1 tablet (40 mg total) by mouth daily.      . metoprolol (LOPRESSOR) 100 MG tablet Take 100 mg po two times daily.  180 tablet  3  . Omega-3 Fatty Acids (FISH OIL) 1200 MG CAPS Take by mouth 3 (three) times daily.      . pravastatin (PRAVACHOL) 40 MG tablet Take 1 tablet (40 mg total) by mouth every evening.  30 tablet  6  . zolpidem (AMBIEN) 10 MG tablet       . [DISCONTINUED] amLODipine (NORVASC) 10 MG tablet Take 1 tablet (10 mg total) by mouth daily.  90 tablet  0  . [DISCONTINUED] carvedilol (COREG) 25 MG tablet Take 1 tablet (25 mg total) by mouth 2 (two) times daily with a meal.  60 tablet  3  . [DISCONTINUED] nitroGLYCERIN (NITROSTAT) 0.4 MG SL tablet Place 1 tablet (0.4 mg total) under the tongue every 5 (five) minutes as needed for chest pain (no more than 3 tabs a day).  20 tablet  0    Past Medical History  Diagnosis Date  . HTN (hypertension)   . Hepatitis C     s/p  interferon/[facility]; chronically elevated LFTs  . Hyperlipidemia   . CAD (coronary artery disease)     LHC 09/11/11: 3 vessel CAD, EF 45-50%  . Hx of CABG 08/2011    Dr. Donata Clay - LIMA to LAD, SVG-D1, SVG-distal RCA, SVG-RI/distal circumflex  . Postoperative atrial fibrillation 08/2011    Amiodarone    Past Surgical History  Procedure Date  . Orchiectomy     left for scar tissue post torsion; Dr Marcello Fennel  . Tympanic membranes     perforation X 3 of  L  TM  ;surgically repaired  . Arthroscopic knee surgery     left  . Coronary artery bypass graft 09/12/2011    Procedure: CORONARY ARTERY BYPASS GRAFTING (CABG);  Surgeon: Kerin Perna, MD;  Location: [facility] OR;  Service: Open Heart Surgery;  Laterality: N/A;   ROS:  As stated in the HPI and negative for all other systems.  PHYSICAL EXAM BP 140/86  Pulse 69  Ht 5\' 9"  (1.753 m)  Wt 174 lb (78.926 kg)  BMI 25.70 kg/m2  SpO2 98% GENERAL:  Well appearing NECK:  No jugular venous distention, waveform within normal limits, carotid upstroke brisk and symmetric, no bruits, no thyromegaly  LUNGS:  Clear to auscultation bilaterally BACK:  No CVA tenderness CHEST:  Well healed sternotomy scar. HEART:  PMI not displaced or sustained,S1 and S2 within normal limits, no S3, no S4, no clicks, no rubs, no murmurs ABD:  Flat, positive bowel sounds normal in frequency in pitch, no bruits, no rebound, no guarding, no midline pulsatile mass, no hepatomegaly, no splenomegaly EXT:  2 plus pulses throughout, no edema, no cyanosis no clubbing SKIN:  No rashes no nodules   ASSESSMENT AND PLAN  CABG  -  The patient continues to do well status post bypass. No change in therapy is indicated.   HYPERTENSION - I will last him to change his lisinopril to 20 twice a day. He will keep a blood pressure diary and we will make further changes probably with addition of spironolactone if his blood pressure remains elevated.  HYPERLIPIDEMIA -  His LDL was 101.  HDL greater than 40. He will remain on the meds as listed.   ALCOHOL - I have suggested AA.

## 2012-05-18 NOTE — Patient Instructions (Addendum)
Please take Lisinopril to 20 mg twice a day Decrease ASA to 81 mg a day Continue all other medications as listed  Follow up in 1 year with Dr Antoine Poche.  You will receive a letter in the mail 2 months before you are due.  Please call us when you receive this letter to schedule your follow up appointment.  Please sign up for My Chart.  This will allow you to review your chart at your convenience.

## 2012-06-04 ENCOUNTER — Other Ambulatory Visit: Payer: Self-pay | Admitting: Cardiology

## 2012-07-04 ENCOUNTER — Other Ambulatory Visit: Payer: Self-pay | Admitting: Internal Medicine

## 2012-07-31 ENCOUNTER — Other Ambulatory Visit: Payer: Self-pay | Admitting: *Deleted

## 2012-07-31 NOTE — Telephone Encounter (Signed)
PT CALLED WANTING TO KNOW IF WE COULD CALL IN HIS RX TO HIS PHARMACY. INFORMED PATIENT HE HAS 11 MORE REFILLS LEFT. CALLED PT PHARMACY AND THEY STATED THEY WAS READING IT WRONG AND WOULD HAVE MEDICATION READY FOR PATIENT IN 30 MINS. CALLED PATIENT BACK TO INFORM HIM OF WHAT WAS GOING ON.

## 2012-08-04 ENCOUNTER — Other Ambulatory Visit: Payer: Self-pay | Admitting: *Deleted

## 2012-08-04 DIAGNOSIS — I10 Essential (primary) hypertension: Secondary | ICD-10-CM

## 2012-08-04 MED ORDER — LISINOPRIL 20 MG PO TABS: 20.0000 mg | ORAL_TABLET | Freq: Two times a day (BID) | ORAL | Status: DC

## 2012-09-03 ENCOUNTER — Telehealth: Payer: Self-pay

## 2012-09-03 NOTE — Telephone Encounter (Signed)
Message left on VM, patient would like referral to liver doctor

## 2012-09-04 ENCOUNTER — Other Ambulatory Visit: Payer: Self-pay | Admitting: Internal Medicine

## 2012-09-04 DIAGNOSIS — B182 Chronic viral hepatitis C: Secondary | ICD-10-CM

## 2012-09-04 NOTE — Telephone Encounter (Signed)
[facility] seen [facility] [place]. Phone Number: [phone], Fax # [phone]

## 2012-09-04 NOTE — Telephone Encounter (Signed)
Patient called again to verify message received and to reiterate that he needs a referral to a liver doctor for hepatitis

## 2012-09-04 NOTE — Telephone Encounter (Signed)
?   [facility] here in [place]

## 2012-09-17 ENCOUNTER — Other Ambulatory Visit: Payer: Self-pay | Admitting: Internal Medicine

## 2012-09-17 DIAGNOSIS — B1920 Unspecified viral hepatitis C without hepatic coma: Secondary | ICD-10-CM

## 2012-09-21 ENCOUNTER — Ambulatory Visit
Admission: RE | Admit: 2012-09-21 | Discharge: 2012-09-21 | Disposition: A | Discharge status: Home / Self Care | Payer: BC Managed Care – PPO | Source: Ambulatory Visit | Attending: Internal Medicine | Admitting: Internal Medicine

## 2012-09-21 DIAGNOSIS — B1920 Unspecified viral hepatitis C without hepatic coma: Secondary | ICD-10-CM

## 2012-10-30 ENCOUNTER — Other Ambulatory Visit: Payer: Self-pay | Admitting: Physician Assistant

## 2012-10-30 ENCOUNTER — Other Ambulatory Visit: Payer: Self-pay | Admitting: Cardiology

## 2012-11-24 ENCOUNTER — Other Ambulatory Visit: Payer: Self-pay | Admitting: Cardiology

## 2012-11-25 ENCOUNTER — Other Ambulatory Visit: Payer: Self-pay | Admitting: Cardiology

## 2012-12-11 ENCOUNTER — Encounter: Payer: Self-pay | Admitting: Nurse Practitioner

## 2012-12-11 ENCOUNTER — Ambulatory Visit (INDEPENDENT_AMBULATORY_CARE_PROVIDER_SITE_OTHER): Payer: BC Managed Care – PPO | Admitting: Nurse Practitioner

## 2012-12-11 VITALS — BP 118/78 | HR 64 | Ht 69.0 in | Wt 169.0 lb

## 2012-12-11 DIAGNOSIS — I2510 Atherosclerotic heart disease of native coronary artery without angina pectoris: Secondary | ICD-10-CM

## 2012-12-11 MED ORDER — NITROGLYCERIN 0.4 MG SL SUBL: 0.4000 mg | SUBLINGUAL_TABLET | SUBLINGUAL | Status: DC | PRN

## 2012-12-11 NOTE — Progress Notes (Signed)
Manuel Bell Date of Birth: Jun 21, 1956 Medical Record #[id number]  History of Present Illness: Manuel Bell is seen back today for a medication refill visit. Seen for Dr. Antoine Poche. Has known CAD with CABG in 2013. Other issues include alcohol use, HTN, hepatitis C with chronically elevated LFTs, and HLD. EF was 45 to 50% at the time of his surgery.   Last seen in November. He admitted that he was drinking too much alcohol. BP meds were adjusted.   He comes in today. He is here alone. Doing ok. No chest pain. Exercising regularly. Still drinking too much and he knows he is drinking too much. Wants a refill on his Ambien and NTG.   Current Outpatient Prescriptions  Medication Sig Dispense Refill  . acyclovir ointment (ZOVIRAX) 5 % Apply topically every 3 (three) hours.  30 g  2  . aspirin 81 MG EC tablet Take 1 tablet (81 mg total) by mouth daily.      . hydrochlorothiazide (HYDRODIURIL) 25 MG tablet TAKE 1 TABLET EVERY DAY  30 tablet  6  . lansoprazole (PREVACID) 30 MG capsule TAKE ONE CAPSULE BY MOUTH EVERY DAY  90 capsule  1  . lisinopril (PRINIVIL,ZESTRIL) 20 MG tablet Take 1 tablet (20 mg total) by mouth 2 (two) times daily.  60 tablet  6  . metoprolol (LOPRESSOR) 100 MG tablet TAKE 1 TABLET BY MOUTH TWICE DAILY  180 tablet  1  . nitroGLYCERIN (NITROSTAT) 0.4 MG SL tablet Place 0.4 mg under the tongue every 5 (five) minutes as needed for chest pain.      . Omega-3 Fatty Acids (FISH OIL) 1200 MG CAPS Take by mouth 3 (three) times daily.      . pravastatin (PRAVACHOL) 40 MG tablet TAKE 1 TABLET BY MOUTH EVERY EVENING  30 tablet  6  . [DISCONTINUED] amLODipine (NORVASC) 10 MG tablet Take 1 tablet (10 mg total) by mouth daily.  90 tablet  0  . [DISCONTINUED] carvedilol (COREG) 25 MG tablet Take 1 tablet (25 mg total) by mouth 2 (two) times daily with a meal.  60 tablet  3   No current facility-administered medications for this visit.    No Known Allergies  Past Medical History    Diagnosis Date  . HTN (hypertension)   . Hepatitis C     s/p interferon/[facility]; chronically elevated LFTs  . Hyperlipidemia   . CAD (coronary artery disease)     LHC 09/11/11: 3 vessel CAD, EF 45-50%  . Hx of CABG 08/2011    Dr. Donata Clay - LIMA to LAD, SVG-D1, SVG-distal RCA, SVG-RI/distal circumflex  . Postoperative atrial fibrillation 08/2011    Amiodarone    Past Surgical History  Procedure Laterality Date  . Orchiectomy      left for scar tissue post torsion; Dr Marcello Fennel  . Tympanic membranes      perforation X 3 of  L  TM  ;surgically repaired  . Arthroscopic knee surgery      left  . Coronary artery bypass graft  09/12/2011    Procedure: CORONARY ARTERY BYPASS GRAFTING (CABG);  Surgeon: Kerin Perna, MD;  Location: [facility] OR;  Service: Open Heart Surgery;  Laterality: N/A;    History  Smoking status  . Former Smoker -- 1.00 packs/day for 30 years  . Types: Cigarettes  . Quit date: 09/10/2003  Smokeless tobacco  . Not on file    History  Alcohol Use  . 8.4 oz/week  . 14 Cans of  beer per week    Family History  Problem Relation Age of Onset  . Lung cancer Mother 51  . Heart attack Maternal Grandfather 54  . Hyperlipidemia Sister   . Diabetes Neg Hx   . Stroke Neg Hx     Review of Systems: The review of systems is per the HPI.  All other systems were reviewed and are negative.  Physical Exam: BP 118/78  Pulse 64  Ht 5\' 9"  (1.753 m)  Wt 169 lb (76.658 kg)  BMI 24.95 kg/m2 Patient is pleasant and in no acute distress. Skin is warm and dry. Color is normal.  HEENT is unremarkable. Normocephalic/atraumatic. PERRL. Sclera are nonicteric. Neck is supple. No masses. No JVD. Lungs are clear. Cardiac exam shows a regular rate and rhythm. Abdomen is soft. Extremities are without edema. Gait and ROM are intact. No gross neurologic deficits noted.  LABORATORY DATA:  Lab Results  Component Value Date   WBC 12.5* 09/15/2011   HGB 11.3* 09/15/2011   HCT 32.7*  09/15/2011   PLT 136* 09/15/2011   GLUCOSE 147* 05/01/2012   CHOL 177 03/17/2012   TRIG 138.0 03/17/2012   HDL 48.30 03/17/2012   LDLDIRECT 146.5 11/29/2009   LDLCALC 101* 03/17/2012   ALT 145* 01/27/2012   AST 122* 01/27/2012   NA 137 05/01/2012   K 3.9 05/01/2012   CL 99 05/01/2012   CREATININE 0.8 05/01/2012   BUN 13 05/01/2012   CO2 33* 05/01/2012   TSH 1.51 01/10/2012   INR 1.42 09/12/2011   HGBA1C 5.8 01/10/2012     Assessment / Plan: 1. CAD - with history of CABG in 2013 - doing well with symptoms. I have refilled his NTG today. I have deferred his NTG to his PCP.  2. HTN - BP looks good.   3. Alcohol abuse - still an issue - he is not ready to stop at this time. AA was recommended.   Patient is agreeable to this plan and will call if any problems develop in the interim.   Rosalio Macadamia, RN, ANP-C [facility] [address] [place], [place]  [address]

## 2012-12-11 NOTE — Patient Instructions (Addendum)
I think heart wise you are doing ok  Alcohol cessation is encouraged - AA is a good program  See Dr. Antoine Poche in November  Call the [facility] [place] office at [phone] if you have any questions, problems or concerns.

## 2012-12-29 ENCOUNTER — Other Ambulatory Visit: Payer: Self-pay | Admitting: Internal Medicine

## 2013-01-14 ENCOUNTER — Encounter: Payer: Self-pay | Admitting: Internal Medicine

## 2013-01-14 ENCOUNTER — Ambulatory Visit (INDEPENDENT_AMBULATORY_CARE_PROVIDER_SITE_OTHER): Payer: BC Managed Care – PPO | Admitting: Internal Medicine

## 2013-01-14 VITALS — BP 132/80 | HR 75 | Temp 97.9°F | Resp 12 | Ht 69.0 in | Wt 171.0 lb

## 2013-01-14 DIAGNOSIS — Z0000 Encounter for general adult medical examination without abnormal findings: Secondary | ICD-10-CM

## 2013-01-14 LAB — BASIC METABOLIC PANEL
Calcium: 9.2 mg/dL (ref 8.4–10.5)
Creatinine, Ser: 0.8 mg/dL (ref 0.4–1.5)
GFR: 108.2 mL/min (ref >60.00)
Sodium: 136 mEq/L (ref 135–145)

## 2013-01-14 LAB — HEPATIC FUNCTION PANEL
ALT: 196 U/L — ABNORMAL HIGH (ref 0–53)
AST: 185 U/L — ABNORMAL HIGH (ref 0–37)
Albumin: 3.8 g/dL (ref 3.5–5.2)
Alkaline Phosphatase: 71 U/L (ref 39–117)
Bilirubin, Direct: 0.3 mg/dL (ref 0.0–0.3)
Total Protein: 7.8 g/dL (ref 6.0–8.3)

## 2013-01-14 LAB — CBC WITH DIFFERENTIAL/PLATELET
Basophils Relative: 0.4 % (ref 0.0–3.0)
Eosinophils Absolute: 0.5 10*3/uL (ref 0.0–0.7)
Eosinophils Relative: 8.6 % — ABNORMAL HIGH (ref 0.0–5.0)
Hemoglobin: 15.6 g/dL (ref 13.0–17.0)
Lymphocytes Relative: 22.3 % (ref 12.0–46.0)
Monocytes Relative: 8.4 % (ref 3.0–12.0)
Neutro Abs: 3.3 10*3/uL (ref 1.4–7.7)
Neutrophils Relative %: 60.3 % (ref 43.0–77.0)
RBC: 4.54 Mil/uL (ref 4.22–5.81)
WBC: 5.5 10*3/uL (ref 4.5–10.5)

## 2013-01-14 LAB — LIPID PANEL
HDL: 46.1 mg/dL (ref >39.00)
Total CHOL/HDL Ratio: 4

## 2013-01-14 LAB — TSH: TSH: 1.03 u[IU]/mL (ref 0.35–5.50)

## 2013-01-14 MED ORDER — ZOLPIDEM TARTRATE 10 MG PO TABS: ORAL_TABLET | ORAL | Status: DC

## 2013-01-14 NOTE — Progress Notes (Signed)
  Subjective:    Patient ID: Manuel Bell, male    DOB: 11-09-1955, 57 y.o.   MRN: [id number]  HPI  He is here for a physical;acute issues include leg cramps in evening. He is on HCTZ.     Review of Systems He is on a heart healthy diet; he exercises as walking 1 mpd 5 times per week without symptoms. Specifically he denies chest pain, palpitations, dyspnea, or claudication. Family history is negative for premature coronary disease. Because of CAD (S/P CBAG) his LDL goal is less than 70.  Has occasional pill dysphagia with large pills such as vitamin C. Rarely does he have food dysphagia. He has no melena or rectal bleeding. Weight loss has been purposeful     Objective:   Physical Exam Gen.: Thin but healthy and well-nourished in appearance. Alert, appropriate and cooperative throughout exam.ppears younger than stated age  Head: Normocephalic without obvious abnormalities;  pattern alopecia . Beard & moustache Eyes: No corneal or conjunctival inflammation noted.  Extraocular motion intact. Vision grossly normal without lenses Ears: External  ear exam reveals no significant lesions or deformities. Canals clear .TMs normal. Hearing is grossly decreased on L. Nose: External nasal exam reveals no deformity or inflammation. Nasal mucosa are pink and moist. No lesions or exudates noted.   Mouth: Oral mucosa and oropharynx reveal no lesions or exudates. Teeth in good repair. Neck: No deformities, masses, or tenderness noted. Range of motion & Thyroid normal. Lungs: Normal respiratory effort; chest expands symmetrically. Lungs are clear to auscultation without rales, wheezes, or increased work of breathing. Heart: Normal rate and rhythm. Normal S1 ;accentuated S2. No gallop, click, or rub. No murmur. Abdomen: Bowel sounds normal; abdomen soft and nontender. No masses, organomegaly or hernias noted. Genitalia: Genitalia normal except for absent L testes. Prostate is normal without enlargement,  asymmetry, nodularity, or induration.                        Musculoskeletal/extremities: No deformity or scoliosis noted of  the thoracic or lumbar spine.  No clubbing, cyanosis, edema, or significant extremity  deformity noted. Range of motion normal .Tone & strength  Normal. Joints normal. Nail health good. Able to lie down & sit up w/o help. Negative SLR bilaterally Vascular: Carotid, radial artery, dorsalis pedis and  posterior tibial pulses are full and equal. No bruits present. Neurologic: Alert and oriented x3. Deep tendon reflexes symmetrical and normal.    Skin: Intact without suspicious lesions or rashes. Lymph: No cervical, axillary, or inguinal lymphadenopathy present. Psych: Mood and affect are normal. Normally interactive                                                                                        Assessment & Plan:  #1 comprehensive physical exam; no acute findings  Plan: see Orders  & Recommendations

## 2013-01-14 NOTE — Patient Instructions (Addendum)
Please perform isometric exercises before going to bed. Sit on side of the bed and raise up on toes to a count of 5. Then put pressure on the heels to a count of 5. Repeat this process 10 times. This will improve blood flow to the calves & help prevent cramps. Wear arch supports in all shoes. You can soak  foot in warm Epsom salts before going to bed.  Reflux of gastric acid may be asymptomatic as this may occur mainly during sleep.The triggers for reflux  include stress; the "aspirin family" ; alcohol; peppermint; and caffeine (coffee, tea, cola, and chocolate). The aspirin family would include aspirin and the nonsteroidal agents such as ibuprofen &  Naproxen. Tylenol would not cause reflux. If having symptoms ; food & drink should be avoided for @ least 2 hours before going to bed.   If you activate the  My Chart system; lab & Xray results will be released directly  to you as soon as I review & address these through the computer. If you choose not to sign up for My Chart within 36 hours of labs being drawn; results will be reviewed & interpretation added before being copied & mailed, causing a delay in getting the results to you.If you do not receive that report within 7-10 days ,please call. Additionally you can use this system to gain direct  access to your records  if  out of town or @ an office of a  physician who is not in  the My Chart network.  This improves continuity of care & places you in control of your medical record.

## 2013-01-30 ENCOUNTER — Other Ambulatory Visit: Payer: Self-pay | Admitting: Cardiology

## 2013-03-21 ENCOUNTER — Other Ambulatory Visit: Payer: Self-pay | Admitting: Cardiology

## 2013-03-22 ENCOUNTER — Other Ambulatory Visit: Payer: Self-pay | Admitting: *Deleted

## 2013-03-22 ENCOUNTER — Other Ambulatory Visit: Payer: Self-pay | Admitting: Cardiology

## 2013-03-22 ENCOUNTER — Other Ambulatory Visit: Payer: Self-pay | Admitting: Internal Medicine

## 2013-03-22 DIAGNOSIS — K219 Gastro-esophageal reflux disease without esophagitis: Secondary | ICD-10-CM

## 2013-03-22 MED ORDER — LANSOPRAZOLE 30 MG PO CPDR: DELAYED_RELEASE_CAPSULE | ORAL | Status: DC

## 2013-03-22 NOTE — Telephone Encounter (Signed)
Refill for prevacid sent to [facility] on [address]

## 2013-03-23 NOTE — Telephone Encounter (Signed)
Med denied already filled.

## 2013-04-05 ENCOUNTER — Encounter: Payer: Self-pay | Admitting: Cardiology

## 2013-04-22 ENCOUNTER — Other Ambulatory Visit: Payer: Self-pay | Admitting: Cardiology

## 2013-04-29 ENCOUNTER — Other Ambulatory Visit: Payer: Self-pay

## 2013-05-18 ENCOUNTER — Ambulatory Visit: Payer: BC Managed Care – PPO | Admitting: Cardiology

## 2013-05-25 ENCOUNTER — Encounter: Payer: Self-pay | Admitting: Cardiology

## 2013-05-25 ENCOUNTER — Ambulatory Visit (INDEPENDENT_AMBULATORY_CARE_PROVIDER_SITE_OTHER): Payer: BC Managed Care – PPO | Admitting: Cardiology

## 2013-05-25 VITALS — BP 132/72 | HR 57 | Ht 69.0 in | Wt 175.0 lb

## 2013-05-25 DIAGNOSIS — I2510 Atherosclerotic heart disease of native coronary artery without angina pectoris: Secondary | ICD-10-CM

## 2013-05-25 MED ORDER — ACYCLOVIR 5 % EX OINT: 1.0000 "application " | TOPICAL_OINTMENT | CUTANEOUS | Status: DC | PRN

## 2013-05-25 NOTE — Progress Notes (Signed)
HPI The patient returns after bypass surgery last year.  Since I last saw him he is doing well.  He denies any ongoing chest discomfort. He is active at work. He says with this level of activity he's not having any shortness of breath, PND or orthopnea. He's not having any palpitations, presyncope or syncope. He's had no weight gain or edema. He does have some cramping which she relates to the pravastatin.  No Known Allergies  Current Outpatient Prescriptions  Medication Sig Dispense Refill  . hydrochlorothiazide (HYDRODIURIL) 25 MG tablet TAKE 1 TABLET EVERY DAY  30 tablet  6  . lansoprazole (PREVACID) 30 MG capsule TAKE ONE CAPSULE BY MOUTH EVERY DAY  90 capsule  3  . lisinopril (PRINIVIL,ZESTRIL) 20 MG tablet TAKE 1 TABLET TWICE A DAY  60 tablet  2  . metoprolol (LOPRESSOR) 100 MG tablet TAKE 1 TABLET BY MOUTH TWICE DAILY  180 tablet  0  . nitroGLYCERIN (NITROSTAT) 0.4 MG SL tablet Place 1 tablet (0.4 mg total) under the tongue every 5 (five) minutes as needed for chest pain.  25 tablet  6  . Omega-3 Fatty Acids (FISH OIL) 1200 MG CAPS Take by mouth 3 (three) times daily.      . pravastatin (PRAVACHOL) 40 MG tablet TAKE 1 TABLET BY MOUTH EVERY EVENING  30 tablet  6  . zolpidem (AMBIEN) 10 MG tablet 1/2 -1 every 3rd night prn only; do not take with alcohol  10 tablet  2  . [DISCONTINUED] amLODipine (NORVASC) 10 MG tablet Take 1 tablet (10 mg total) by mouth daily.  90 tablet  0  . [DISCONTINUED] carvedilol (COREG) 25 MG tablet Take 1 tablet (25 mg total) by mouth 2 (two) times daily with a meal.  60 tablet  3   No current facility-administered medications for this visit.    Past Medical History  Diagnosis Date  . HTN (hypertension)   . Hepatitis C     s/p interferon/[facility] Rx; chronically elevated LFTs  . Hyperlipidemia   . CAD (coronary artery disease)     LHC 09/11/11: 3 vessel CAD, EF 45-50%  . Hx of CABG 08/2011    Dr. Donata Clay - LIMA to LAD, SVG-D1, SVG-distal RCA,  SVG-RI/distal circumflex  . Postoperative atrial fibrillation 08/2011    Amiodarone  . Alcohol abuse     Past Surgical History  Procedure Laterality Date  . Orchiectomy      left for scar tissue post torsion; Dr Marcello Fennel  . Tympanic membranes      perforation X 3 of  L  TM  ;surgically repaired  . Arthroscopic knee surgery      left  . Coronary artery bypass graft  09/12/2011    Procedure: CORONARY ARTERY BYPASS GRAFTING (CABG);  Surgeon: Kerin Perna, MD;  Location: [facility] OR;  Service: Open Heart Surgery;  Laterality: N/A;  . Colonoscopy  2005    negative; [facility]   ROS:  As stated in the HPI and negative for all other systems.  PHYSICAL EXAM BP 132/72  Pulse 57  Ht 5\' 9"  (1.753 m)  Wt 175 lb (79.379 kg)  BMI 25.83 kg/m2 GENERAL:  Well appearing NECK:  No jugular venous distention, waveform within normal limits, carotid upstroke brisk and symmetric, no bruits, no thyromegaly LUNGS:  Clear to auscultation bilaterally CHEST:  Well healed sternotomy scar. HEART:  PMI not displaced or sustained,S1 and S2 within normal limits, no S3, no S4, no clicks, no rubs, no  murmurs ABD:  Flat, positive bowel sounds normal in frequency in pitch, no bruits, no rebound, no guarding, no midline pulsatile mass, no hepatomegaly, no splenomegaly EXT:  2 plus pulses throughout, no edema, no cyanosis no clubbing SKIN:  No rashes no nodules  Sinus rhythm, rate 57, axis within normal limits, intervals within normal limits, no acute ST-T wave changes.  05/25/2013  ASSESSMENT AND PLAN  CABG  -  The patient continues to do well status post bypass. No change in therapy is indicated.  He will continue with risk reduction.   HYPERTENSION - The blood pressure is at target. No change in medications is indicated. We will continue with therapeutic lifestyle changes (TLC).  HYPERLIPIDEMIA -  His LDL was 108 in July.  However, given the fact he is having symptoms with pravastatin I will not go up on the  dose. He will however look at his diet to see if he can make modifications.  ALCOHOL - He says he is cutting back and he and I have discussed this in the past.

## 2013-05-25 NOTE — Patient Instructions (Signed)
The current medical regimen is effective;  continue present plan and medications.  You may use Co Q 10 as directed over the counter.  Follow up in 1 year with Dr Antoine Poche.  You will receive a letter in the mail 2 months before you are due.  Please call us when you receive this letter to schedule your follow up appointment.

## 2013-06-14 ENCOUNTER — Other Ambulatory Visit: Payer: Self-pay | Admitting: Cardiology

## 2013-07-25 ENCOUNTER — Other Ambulatory Visit: Payer: Self-pay | Admitting: Cardiology

## 2013-07-28 ENCOUNTER — Other Ambulatory Visit: Payer: Self-pay | Admitting: Internal Medicine

## 2013-07-28 DIAGNOSIS — C220 Liver cell carcinoma: Secondary | ICD-10-CM

## 2013-08-03 ENCOUNTER — Ambulatory Visit
Admission: RE | Admit: 2013-08-03 | Discharge: 2013-08-03 | Disposition: A | Discharge status: Home / Self Care | Payer: BC Managed Care – PPO | Source: Ambulatory Visit | Attending: Internal Medicine | Admitting: Internal Medicine

## 2013-08-03 DIAGNOSIS — C220 Liver cell carcinoma: Secondary | ICD-10-CM

## 2013-09-01 ENCOUNTER — Other Ambulatory Visit: Payer: Self-pay | Admitting: Cardiology

## 2013-09-29 ENCOUNTER — Encounter: Payer: Self-pay | Admitting: Physician Assistant

## 2013-11-03 IMAGING — CR DG CHEST 2V
2 series · 2 of 2 positions shown · non-contrast
Comparison: 08/26/2007.

CLINICAL DATA: Chest tightness.  Former smoker.

CHEST - 2 VIEW

[view not recorded (1 of 2)]
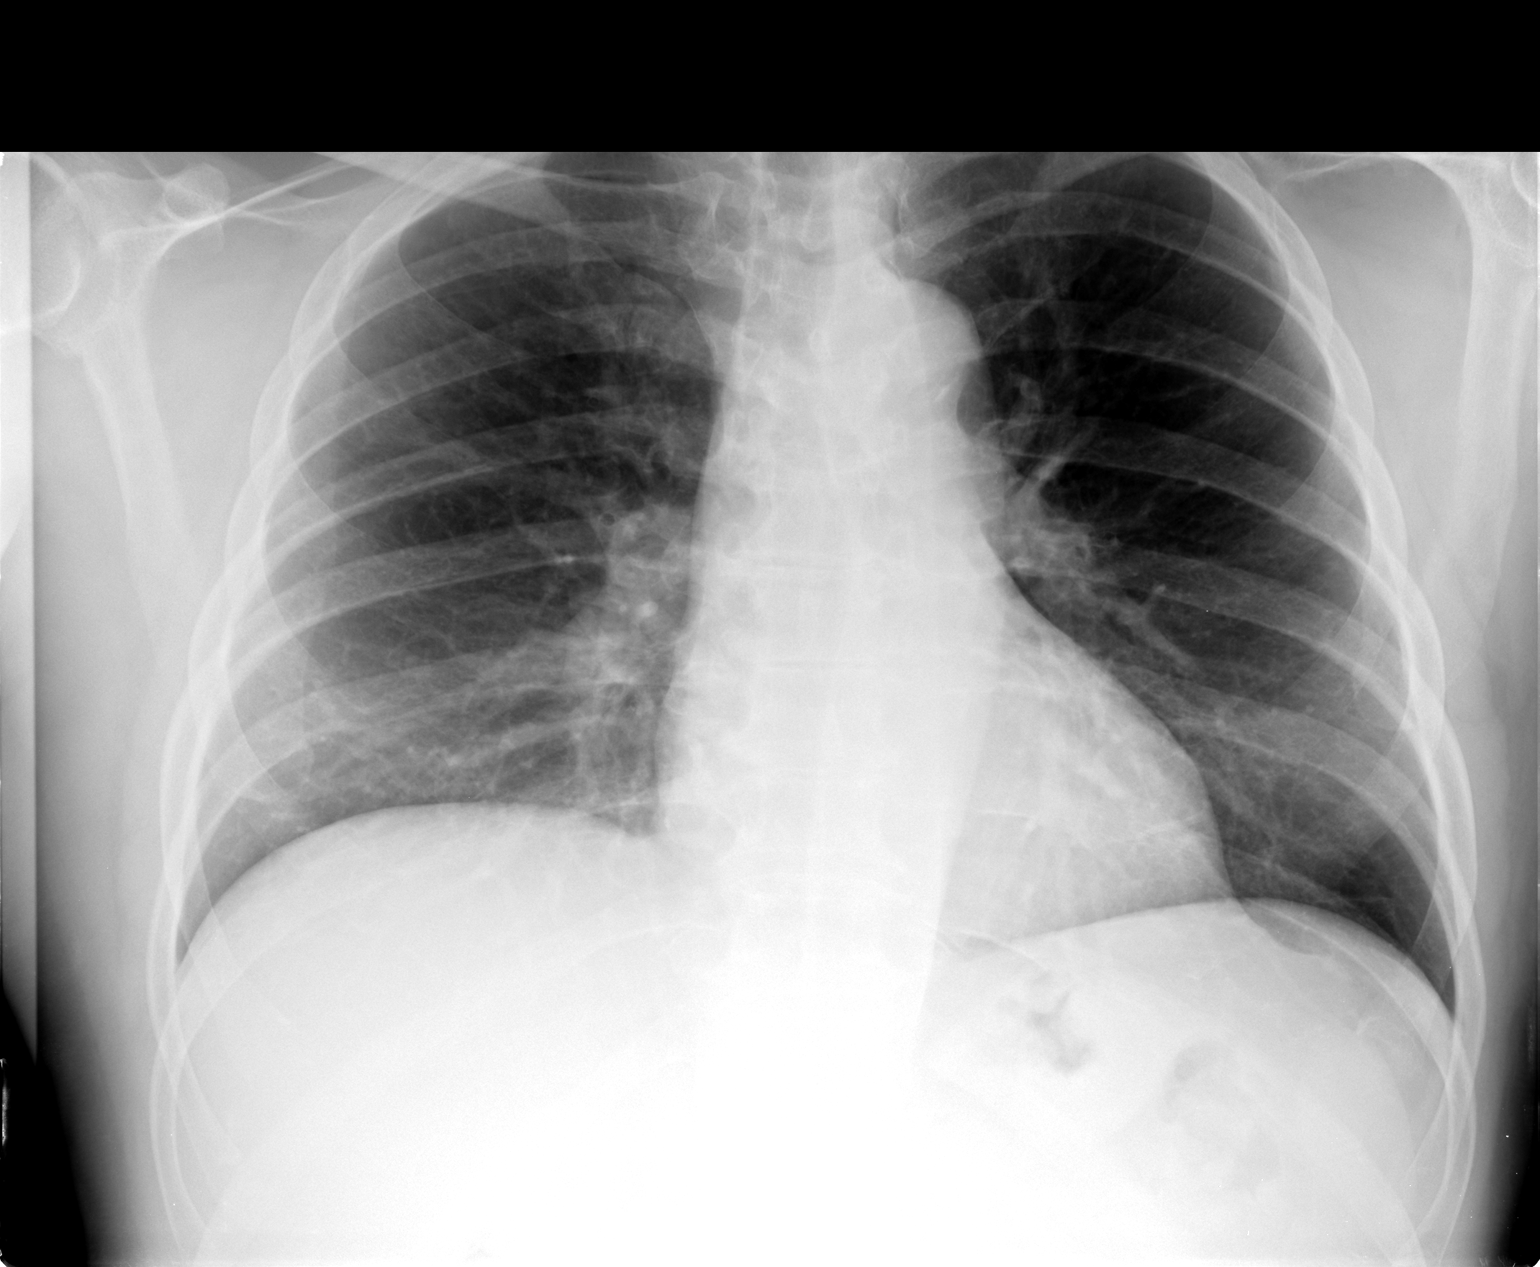

[view not recorded (2 of 2)]
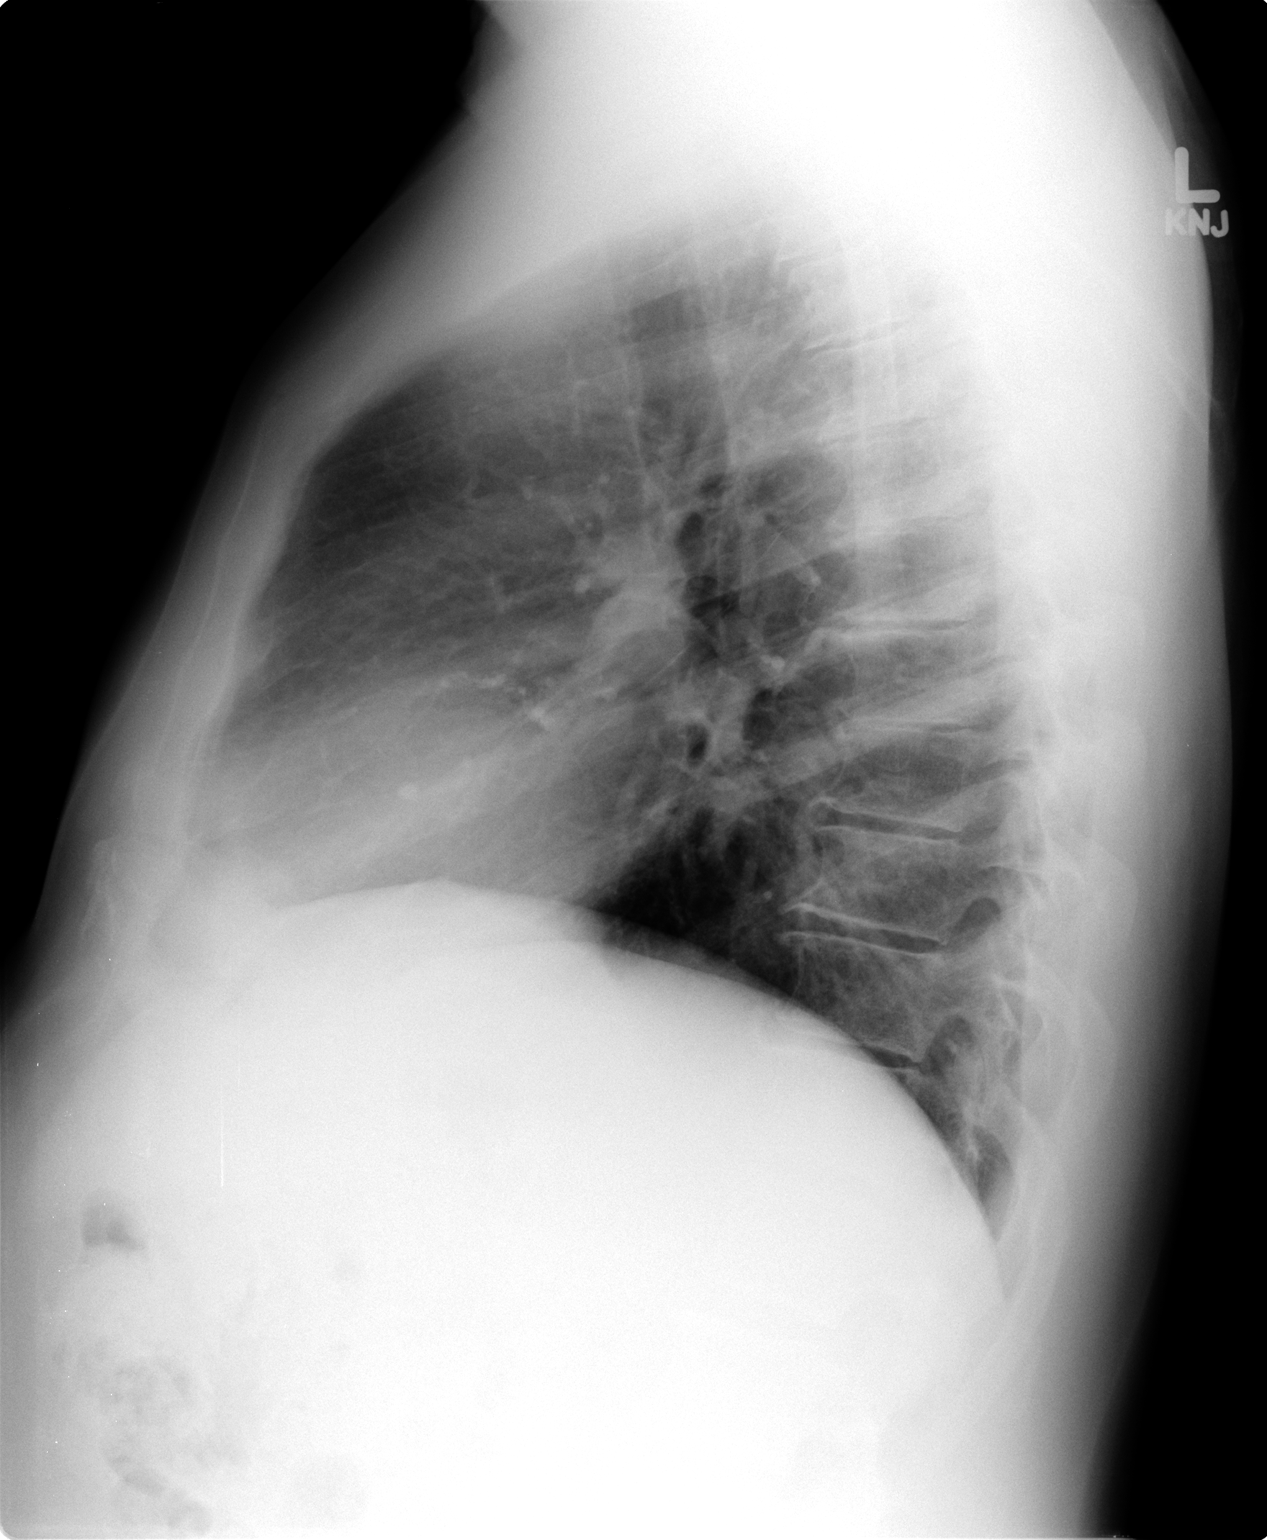

[2 of 2 positions shown; findings below may reference images not displayed]

FINDINGS: The cardiac silhouette is normal size and shape. Ectasia
and nonaneurysmal calcification of the thoracic aorta are seen.
Mediastinal and hilar contours appear stable.  No pulmonary
infiltrates or nodules were evident. No pleural abnormality is
evident.  Changes of degenerative disc disease and degenerative
spondylosis are seen.
IMPRESSION: No acute or active cardiopulmonary or pleural abnormality is
evident.  Stable appearance of chest.

## 2013-12-09 ENCOUNTER — Other Ambulatory Visit: Payer: Self-pay | Admitting: *Deleted

## 2013-12-09 MED ORDER — HYDROCHLOROTHIAZIDE 25 MG PO TABS: ORAL_TABLET | ORAL | Status: DC

## 2013-12-14 ENCOUNTER — Other Ambulatory Visit: Payer: Self-pay | Admitting: Nurse Practitioner

## 2013-12-14 DIAGNOSIS — R772 Abnormality of alphafetoprotein: Secondary | ICD-10-CM

## 2013-12-20 ENCOUNTER — Ambulatory Visit
Admission: RE | Admit: 2013-12-20 | Discharge: 2013-12-20 | Disposition: A | Discharge status: Home / Self Care | Payer: BC Managed Care – PPO | Source: Ambulatory Visit | Attending: Nurse Practitioner | Admitting: Nurse Practitioner

## 2013-12-20 DIAGNOSIS — R772 Abnormality of alphafetoprotein: Secondary | ICD-10-CM

## 2013-12-20 MED ORDER — GADOXETATE DISODIUM 0.25 MMOL/ML IV SOLN
9.0000 mL | Freq: Once | INTRAVENOUS | Status: AC | PRN
  Administered 2013-12-20: 9 mL via INTRAVENOUS

## 2013-12-31 ENCOUNTER — Other Ambulatory Visit: Payer: Self-pay

## 2013-12-31 MED ORDER — ZOLPIDEM TARTRATE 10 MG PO TABS: ORAL_TABLET | ORAL | Status: DC

## 2013-12-31 NOTE — Telephone Encounter (Signed)
OK X1 

## 2014-02-03 ENCOUNTER — Encounter (HOSPITAL_COMMUNITY): Payer: Self-pay | Admitting: Pharmacy Technician

## 2014-02-14 ENCOUNTER — Encounter (HOSPITAL_COMMUNITY): Payer: Self-pay | Admitting: *Deleted

## 2014-02-15 ENCOUNTER — Other Ambulatory Visit: Payer: Self-pay | Admitting: Gastroenterology

## 2014-02-15 ENCOUNTER — Encounter (HOSPITAL_COMMUNITY): Payer: Self-pay | Admitting: *Deleted

## 2014-02-15 NOTE — Addendum Note (Signed)
Addended by: Eryca Bolte JR. on: 02/15/2014 09:24 AM   Modules accepted: Orders  

## 2014-02-16 ENCOUNTER — Ambulatory Visit (HOSPITAL_COMMUNITY)
Admission: RE | Admit: 2014-02-16 | Discharge: 2014-02-16 | Disposition: A | Discharge status: Home / Self Care | Payer: BC Managed Care – PPO | Source: Ambulatory Visit | Attending: Gastroenterology | Admitting: Gastroenterology

## 2014-02-16 ENCOUNTER — Encounter (HOSPITAL_COMMUNITY)
Admission: RE | Disposition: A | Discharge status: Home / Self Care | Payer: Self-pay | Source: Ambulatory Visit | Attending: Gastroenterology

## 2014-02-16 ENCOUNTER — Encounter (HOSPITAL_COMMUNITY): Payer: BC Managed Care – PPO | Admitting: Anesthesiology

## 2014-02-16 ENCOUNTER — Ambulatory Visit (HOSPITAL_COMMUNITY): Payer: BC Managed Care – PPO | Admitting: Anesthesiology

## 2014-02-16 ENCOUNTER — Encounter (HOSPITAL_COMMUNITY): Payer: Self-pay | Admitting: Gastroenterology

## 2014-02-16 DIAGNOSIS — I4891 Unspecified atrial fibrillation: Secondary | ICD-10-CM | POA: Insufficient documentation

## 2014-02-16 DIAGNOSIS — Z87891 Personal history of nicotine dependence: Secondary | ICD-10-CM | POA: Diagnosis not present

## 2014-02-16 DIAGNOSIS — E785 Hyperlipidemia, unspecified: Secondary | ICD-10-CM | POA: Diagnosis not present

## 2014-02-16 DIAGNOSIS — B1710 Acute hepatitis C without hepatic coma: Secondary | ICD-10-CM | POA: Insufficient documentation

## 2014-02-16 DIAGNOSIS — D131 Benign neoplasm of stomach: Secondary | ICD-10-CM | POA: Insufficient documentation

## 2014-02-16 DIAGNOSIS — I10 Essential (primary) hypertension: Secondary | ICD-10-CM | POA: Diagnosis not present

## 2014-02-16 DIAGNOSIS — J449 Chronic obstructive pulmonary disease, unspecified: Secondary | ICD-10-CM | POA: Diagnosis not present

## 2014-02-16 DIAGNOSIS — Z951 Presence of aortocoronary bypass graft: Secondary | ICD-10-CM | POA: Diagnosis not present

## 2014-02-16 DIAGNOSIS — I2510 Atherosclerotic heart disease of native coronary artery without angina pectoris: Secondary | ICD-10-CM | POA: Insufficient documentation

## 2014-02-16 DIAGNOSIS — J4489 Other specified chronic obstructive pulmonary disease: Secondary | ICD-10-CM | POA: Insufficient documentation

## 2014-02-16 DIAGNOSIS — K7030 Alcoholic cirrhosis of liver without ascites: Secondary | ICD-10-CM | POA: Diagnosis not present

## 2014-02-16 DIAGNOSIS — Z79899 Other long term (current) drug therapy: Secondary | ICD-10-CM | POA: Insufficient documentation

## 2014-02-16 DIAGNOSIS — K219 Gastro-esophageal reflux disease without esophagitis: Secondary | ICD-10-CM | POA: Diagnosis not present

## 2014-02-16 HISTORY — DX: Other complications of anesthesia, initial encounter: T88.59XA

## 2014-02-16 HISTORY — DX: Cardiac arrhythmia, unspecified: I49.9

## 2014-02-16 HISTORY — DX: Adverse effect of unspecified anesthetic, initial encounter: T41.45XA

## 2014-02-16 HISTORY — PX: ESOPHAGOGASTRODUODENOSCOPY (EGD) WITH PROPOFOL: SHX5813

## 2014-02-16 HISTORY — DX: Nausea with vomiting, unspecified: R11.2

## 2014-02-16 HISTORY — DX: Gastro-esophageal reflux disease without esophagitis: K21.9

## 2014-02-16 HISTORY — DX: Other specified postprocedural states: Z98.890

## 2014-02-16 HISTORY — DX: Family history of other specified conditions: Z84.89

## 2014-02-16 HISTORY — DX: Unspecified asthma, uncomplicated: J45.909

## 2014-02-16 SURGERY — ESOPHAGOGASTRODUODENOSCOPY (EGD) WITH PROPOFOL
Anesthesia: Monitor Anesthesia Care

## 2014-02-16 MED ORDER — DEXAMETHASONE SODIUM PHOSPHATE 10 MG/ML IJ SOLN
INTRAMUSCULAR | Status: DC | PRN
  Administered 2014-02-16: 8 mg via INTRAVENOUS

## 2014-02-16 MED ORDER — EPINEPHRINE HCL 1 MG/ML IJ SOLN: INTRAMUSCULAR | Status: DC | PRN

## 2014-02-16 MED ORDER — METOPROLOL TARTRATE 1 MG/ML IV SOLN
INTRAVENOUS | Status: AC
  Filled 2014-02-16: qty 5

## 2014-02-16 MED ORDER — ONDANSETRON HCL 4 MG/2ML IJ SOLN
INTRAMUSCULAR | Status: DC | PRN
  Administered 2014-02-16: 4 mg via INTRAVENOUS

## 2014-02-16 MED ORDER — LACTATED RINGERS IV SOLN
INTRAVENOUS | Status: DC | PRN
  Administered 2014-02-16: 10:00:00 via INTRAVENOUS

## 2014-02-16 MED ORDER — MIDAZOLAM HCL 5 MG/5ML IJ SOLN
INTRAMUSCULAR | Status: DC | PRN
  Administered 2014-02-16: 2 mg via INTRAVENOUS

## 2014-02-16 MED ORDER — SODIUM CHLORIDE 0.9 % IV SOLN: INTRAVENOUS | Status: DC

## 2014-02-16 MED ORDER — SODIUM CHLORIDE 0.9 % IJ SOLN
INTRAMUSCULAR | Status: DC | PRN
  Administered 2014-02-16: 11:00:00

## 2014-02-16 MED ORDER — FENTANYL CITRATE 0.05 MG/ML IJ SOLN
INTRAMUSCULAR | Status: DC | PRN
  Administered 2014-02-16: 75 ug via INTRAVENOUS
  Administered 2014-02-16: 50 ug via INTRAVENOUS

## 2014-02-16 MED ORDER — EPINEPHRINE HCL 0.1 MG/ML IJ SOSY
PREFILLED_SYRINGE | INTRAMUSCULAR | Status: AC
  Filled 2014-02-16: qty 10

## 2014-02-16 MED ORDER — LACTATED RINGERS IV SOLN
INTRAVENOUS | Status: DC
  Administered 2014-02-16: 10:00:00 via INTRAVENOUS

## 2014-02-16 MED ORDER — PROPOFOL INFUSION 10 MG/ML OPTIME
INTRAVENOUS | Status: DC | PRN
  Administered 2014-02-16: 100 ug/kg/min via INTRAVENOUS

## 2014-02-16 NOTE — Transfer of Care (Signed)
Immediate Anesthesia Transfer of Care Note  Patient: Manuel Bell  Procedure(s) Performed: Procedure(s) with comments: ESOPHAGOGASTRODUODENOSCOPY (EGD) WITH PROPOFOL (N/A) - H&P in file  Patient Location: PACU  Anesthesia Type:MAC  Level of Consciousness: awake, alert  and oriented  Airway & Oxygen Therapy: Patient Spontanous Breathing and Patient connected to nasal cannula oxygen  Post-op Assessment: Report given to PACU RN, Post -op Vital signs reviewed and stable and Patient moving all extremities X 4  Post vital signs: Reviewed and stable  Complications: No apparent anesthesia complications

## 2014-02-16 NOTE — Anesthesia Procedure Notes (Signed)
Procedure Name: MAC Date/Time: 02/16/2014 10:43 AM Performed by: Neldon Newport Oxygen Delivery Method: Nasal cannula

## 2014-02-16 NOTE — Anesthesia Postprocedure Evaluation (Signed)
  Anesthesia Post-op Note  Patient: Manuel Bell  Procedure(s) Performed: Procedure(s) with comments: ESOPHAGOGASTRODUODENOSCOPY (EGD) WITH PROPOFOL (N/A) - H&P in file  Patient Location: Endoscopy Unit  Anesthesia Type:MAC  Level of Consciousness: awake, alert  and oriented  Airway and Oxygen Therapy: Patient Spontanous Breathing and Patient connected to nasal cannula oxygen  Post-op Pain: none  Post-op Assessment: Post-op Vital signs reviewed, Patient's Cardiovascular Status Stable, Respiratory Function Stable, Patent Airway, No signs of Nausea or vomiting and Adequate PO intake  Post-op Vital Signs: Reviewed and stable  Last Vitals:  Filed Vitals:   02/16/14 0959  BP: 144/78  Pulse: 56  Temp: 36.4 C  Resp: 18    Complications: No apparent anesthesia complications

## 2014-02-16 NOTE — Anesthesia Postprocedure Evaluation (Signed)
  Anesthesia Post-op Note  Patient: Manuel Bell  Procedure(s) Performed: Procedure(s) with comments: ESOPHAGOGASTRODUODENOSCOPY (EGD) WITH PROPOFOL (N/A) - H&P in file  Patient Location: PACU  Anesthesia Type:MAC  Level of Consciousness: awake  Airway and Oxygen Therapy: Patient Spontanous Breathing  Post-op Pain: mild  Post-op Assessment: Post-op Vital signs reviewed  Post-op Vital Signs: Reviewed  Last Vitals:  Filed Vitals:   02/16/14 0959  BP: 144/78  Pulse: 56  Temp: 36.4 C  Resp: 18    Complications: No apparent anesthesia complications

## 2014-02-16 NOTE — Op Note (Signed)
[facility] [place], [id number]   ENDOSCOPY PROCEDURE REPORT  PATIENT: Manuel Bell, Manuel Bell  MR#: [id number] BIRTHDATE: 25-Oct-1955 , 58  yrs. old GENDER: Male ENDOSCOPIST:Lyna Laningham Oletta Lamas, MD REFERRED BY:  [facility] PROCEDURE DATE:  02/16/2014 PROCEDURE:   EGD with biopsy and epinephrine injection ASA CLASS:  class III INDICATIONS:  patient with acute hepatitis C and alcohol ingestion with known cirrhosis. It's currently being treated with Harvoni.workup by the [facility] liver clinic showed cirrhosis with varices. EGD is done to evaluate for endoscopic varices. MEDICATION:    MAC. Propofol 200 mg   DESCRIPTION OF PROCEDURE:   ThePentax adult scope was inserted in advance. We advanced to the 2nd duodenum. The 2nd duodenum and the duodenal bulb or completely normal. The pyloric channel and antrum were normal. Retroflex view there were no obvious gastric varices seen. Along the lesser curve there was a 1 cm gastric polyp it was somewhat probable. This was approximately halfway down the lesser curve. One biopsy was obtained and there was oozing from the polyp. Due to concerns or portal gastropathy I went ahead and injected 1 mL of epinephrine into the base of the polyp with good control of any oozing. The scope was withdrawn in the GE junction and distal esophagus were seen well and were free of any of esophageal varices. The scope was withdrawn in the patient tolerated procedure well.     COMPLICATIONS: None  ENDOSCOPIC IMPRESSION: 1. Gastic Polyp Biopsied. there was some using from the biopsy site and epinephrine was injected 2. 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 No gross esophageal or gastric varices were seen.  RECOMMENDATIONS: 1. will continue patient in current medications. 2 he will continue to follow-up at [facility] liver clinic for his hepatitis C 3. We will get back to him and the results of the gastric polyp biopsies  have returned. 4. We'll see back in the office in 6 weeks    _______________________________ eSigned:  Laurence Spates, MD 02/16/2014 11:18 AM  CC: [facility]   PATIENT NAME:  Manuel Bell, Manuel Bell MR#: [id number]

## 2014-02-16 NOTE — Anesthesia Preprocedure Evaluation (Addendum)
Anesthesia Evaluation  Patient identified by MRN, date of birth, ID band Patient awake  General Assessment Comment:Beta blocker held this am for low HR  Reviewed: Allergy & Precautions, H&P , NPO status , Patient's Chart, lab work & pertinent test results, reviewed documented beta blocker date and time   History of Anesthesia Complications (+) PROLONGED EMERGENCE and history of anesthetic complications  Airway Mallampati: II TM Distance: >3 FB Neck ROM: Full    Dental  (+) Teeth Intact, Dental Advidsory Given   Pulmonary asthma , COPDformer smoker,  breath sounds clear to auscultation        Cardiovascular hypertension, Pt. on medications and Pt. on home beta blockers - angina+ CAD and + CABG + dysrhythmias (post op afib) Atrial Fibrillation Rhythm:Regular Rate:Normal     Neuro/Psych negative neurological ROS     GI/Hepatic GERD-  Medicated and Controlled,(+) Hepatitis -, CElevated LFTs   Endo/Other  negative endocrine ROS  Renal/GU negative Renal ROS     Musculoskeletal   Abdominal   Peds  Hematology negative hematology ROS (+)   Anesthesia Other Findings   Reproductive/Obstetrics                      Anesthesia Physical Anesthesia Plan  ASA: III  Anesthesia Plan: MAC   Post-op Pain Management:    Induction:   Airway Management Planned:   Additional Equipment:   Intra-op Plan:   Post-operative Plan:   Informed Consent: I have reviewed the patients History and Physical, chart, labs and discussed the procedure including the risks, benefits and alternatives for the proposed anesthesia with the patient or authorized representative who has indicated his/her understanding and acceptance.   Dental Advisory Given  Plan Discussed with: Anesthesiologist, CRNA and Surgeon  Anesthesia Plan Comments: (Plan routine monitors, MAC)       Anesthesia Quick Evaluation

## 2014-02-16 NOTE — H&P (Signed)
Subjective:   Patient is a 58 y.o. male presents with cirrhosis secondary to hepatitis C. He has been treated in the past with interferon and is now currently being treated with Harvoni. Tomorrow is have shown portal hypertension and probable splenomegaly with esophageal varices. He is in metoprolol from his cardiologist for hypertension. This procedure is done to screen for esophageal varices. Procedure including risks and benefits discussed in office.  Patient Active Problem List   Diagnosis Date Noted  . Other abnormal glucose 01/07/2012  . Coronary atherosclerosis of native coronary artery 10/02/2011  . S/P CABG x 5 09/24/2011  . LUMBAR RADICULOPATHY, RIGHT 01/02/2010  . HYPERLIPIDEMIA 11/22/2009  . NEURALGIA 11/22/2009  . GERD 10/19/2008  . HYPERTENSION, ESSENTIAL NOS 08/20/2007  . ELEVATION, TRANSAMINASE/LDH LEVELS 02/06/2007  . CARRIER, VIRAL HEPATITIS C 01/28/2007   Past Medical History  Diagnosis Date  . Hepatitis C     s/p interferon/[facility]; chronically elevated LFTs  . Hyperlipidemia   . CAD (coronary artery disease)     LHC 09/11/11: 3 vessel CAD, EF 45-50%  . Hx of CABG 08/2011    Dr. Prescott Gum - LIMA to LAD, SVG-D1, SVG-distal RCA, SVG-RI/distal circumflex  . Postoperative atrial fibrillation 08/2011    Amiodarone  . Alcohol abuse   . Complication of anesthesia     slow to awaken  . PONV (postoperative nausea and vomiting)   . Family history of anesthesia complication   . Dysrhythmia     palpations at times  . Asthma     as a child   . GERD (gastroesophageal reflux disease)     Past Surgical History  Procedure Laterality Date  . Orchiectomy      left for scar tissue post torsion; Dr Hartley Barefoot  . Tympanic membranes      perforation X 3 of  L  TM  ;surgically repaired  . Arthroscopic knee surgery      left  . Coronary artery bypass graft  09/12/2011    Procedure: CORONARY ARTERY BYPASS GRAFTING (CABG);  Surgeon: Ivin Poot, MD;  Location: [place];   Service: Open Heart Surgery;  Laterality: N/A;  . Colonoscopy  2005    negative; [facility]    Prescriptions prior to admission  Medication Sig Dispense Refill  . acyclovir ointment (ZOVIRAX) 5 % Apply 1 application topically as needed (for fever blisters).      . hydrochlorothiazide (HYDRODIURIL) 25 MG tablet Take 25 mg by mouth daily.      . lansoprazole (PREVACID) 30 MG capsule Take 30 mg by mouth daily.      . Ledipasvir-Sofosbuvir (HARVONI) 90-400 MG TABS Take 1 tablet by mouth daily.      Marland Kitchen lisinopril (PRINIVIL,ZESTRIL) 20 MG tablet Take 20 mg by mouth 2 (two) times daily.      . metoprolol (LOPRESSOR) 100 MG tablet Take 100 mg by mouth 2 (two) times daily.      . Omega-3 Fatty Acids (FISH OIL) 1200 MG CAPS Take 1,200 mg by mouth 2 (two) times daily.       . pravastatin (PRAVACHOL) 40 MG tablet Take 40 mg by mouth daily.      Marland Kitchen zolpidem (AMBIEN) 10 MG tablet Take 5-10 mg by mouth at bedtime as needed for sleep. 1/2 -1 every 3rd night prn only; do not take with alcohol      . nitroGLYCERIN (NITROSTAT) 0.4 MG SL tablet Place 1 tablet (0.4 mg total) under the tongue every 5 (five) minutes as needed  for chest pain.  25 tablet  6   No Known Allergies  History  Substance Use Topics  . Smoking status: Former Smoker -- 1.00 packs/day for 30 years    Types: Cigarettes    Quit date: 09/10/2003  . Smokeless tobacco: Not on file     Comment: smoked 1971- 2005 (off intermittently 1.5 years), up to 1 ppd   . Alcohol Use: 8.4 oz/week    14 Cans of beer per week     Comment: none since 4/20 /2015    Family History  Problem Relation Age of Onset  . Lung cancer Mother 75  . Heart attack Maternal Grandfather 54  . Hyperlipidemia Sister   . Diabetes Neg Hx   . Stroke Neg Hx      Objective:   Patient Vitals for the past 8 hrs:  BP Temp Temp src Pulse Resp SpO2 Height Weight  02/16/14 0959 144/78 mmHg 97.5 F (36.4 C) Oral 56 18 100 % 5\' 10"  (1.778 m) 74.844 kg (165 lb)         See  MD Preop evaluation      Assessment:   1. Cirrhosis of the liver. This is due to hepatitis C and alcohol. The procedures done to screen for esophageal varices which were suggested by recent MRI.  Plan:   Will proceed with screening EGD at this time. This is been discussed in the office with the patient.

## 2014-02-16 NOTE — Progress Notes (Signed)
Patient's pain is 1/10 states he felt better after ambulating , and took po ginger ale and some crackers.

## 2014-02-16 NOTE — Discharge Instructions (Addendum)
No aspirin or NSAIDS FOR 1 WEEK  Gastrointestinal Endoscopy, Care After  Refer to this sheet in the next few weeks. These instructions provide you with information on caring for yourself after your procedure. Your caregiver may also give you more specific instructions. Your treatment has been planned according to current medical practices, but problems sometimes occur. Call your caregiver if you have any problems or questions after your procedure. HOME CARE INSTRUCTIONS  If you were given medicine to help you relax (sedative), do not drive, operate machinery, or sign important documents for 24 hours.  Avoid alcohol and hot or warm beverages for the first 24 hours after the procedure.  Only take over-the-counter or prescription medicines for pain, discomfort, or fever as directed by your caregiver. You may resume taking your normal medicines unless your caregiver tells you otherwise. Ask your caregiver when you may resume taking medicines that may cause bleeding, such as aspirin, clopidogrel, or warfarin.  You may return to your normal diet and activities on the day after your procedure, or as directed by your caregiver. Walking may help to reduce any bloated feeling in your abdomen.  Drink enough fluids to keep your urine clear or pale yellow.  You may gargle with salt water if you have a sore throat. SEEK IMMEDIATE MEDICAL CARE IF:  You have severe nausea or vomiting.  You have severe abdominal pain, abdominal cramps that last longer than 6 hours, or abdominal swelling (distention).  You have severe shoulder or back pain.  You have trouble swallowing.  You have shortness of breath, your breathing is shallow, or you are breathing faster than normal.  You have a fever or a rapid heartbeat.  You vomit blood or material that looks like coffee grounds.  You have bloody, black, or tarry stools. MAKE SURE YOU:  Understand these instructions.  Will watch your condition.  Will get  help right away if you are not doing well or get worse. Document Released: 01/23/2004 Document Revised: 10/25/2013 Document Reviewed: 09/10/2011 [facility] Patient Information 2015 [place], [place]. This information is not intended to replace advice given to you by your health care provider. Make sure you discuss any questions you have with your health care provider.

## 2014-02-17 ENCOUNTER — Encounter (HOSPITAL_COMMUNITY): Payer: Self-pay | Admitting: Gastroenterology

## 2014-02-18 ENCOUNTER — Ambulatory Visit (INDEPENDENT_AMBULATORY_CARE_PROVIDER_SITE_OTHER): Payer: BC Managed Care – PPO | Admitting: Nurse Practitioner

## 2014-02-18 ENCOUNTER — Encounter: Payer: Self-pay | Admitting: Nurse Practitioner

## 2014-02-18 VITALS — BP 130/80 | HR 54 | Ht 70.0 in | Wt 163.0 lb

## 2014-02-18 DIAGNOSIS — I10 Essential (primary) hypertension: Secondary | ICD-10-CM

## 2014-02-18 DIAGNOSIS — E785 Hyperlipidemia, unspecified: Secondary | ICD-10-CM

## 2014-02-18 DIAGNOSIS — I498 Other specified cardiac arrhythmias: Secondary | ICD-10-CM

## 2014-02-18 DIAGNOSIS — R001 Bradycardia, unspecified: Secondary | ICD-10-CM

## 2014-02-18 DIAGNOSIS — I2510 Atherosclerotic heart disease of native coronary artery without angina pectoris: Secondary | ICD-10-CM

## 2014-02-18 MED ORDER — CARVEDILOL 12.5 MG PO TABS: 12.5000 mg | ORAL_TABLET | Freq: Two times a day (BID) | ORAL | Status: DC

## 2014-02-18 NOTE — Progress Notes (Signed)
Manuel Bell Date of Birth: 1955/10/10 Medical Record #[id number]  History of Present Illness: Manuel Bell is seen back today for a follow up visit. Seen for Dr. Percival Spanish. He is a 58 year old male with CAD with past CABG in 2013, HTN, HLD and alcohol abuse. Other issues as noted below. Ef was 45 to 50% at the time of his cath back in 2013.   Seen here in December of 2014. Cardiac status ok.  Comes back today - GI wanting to change to Coreg for portal hypertension. This was ok with Dr. Percival Spanish but HR is a little low and he wanted him evaluated first. He is here alone. He has no cardiac complaints. No chest pain. Not short of breath. Not dizzy or lightheaded. Some fatigue. Has been treated successfully for his Hepatitis.  He is active. Exercising daily with walking/yoga. Not smoking. Not drinking alcohol. No bleeding. He notes that he has lost weight.     Current Outpatient Prescriptions  Medication Sig Dispense Refill  . acyclovir ointment (ZOVIRAX) 5 % Apply 1 application topically as needed (for fever blisters).      . hydrochlorothiazide (HYDRODIURIL) 25 MG tablet Take 25 mg by mouth daily.      . lansoprazole (PREVACID) 30 MG capsule Take 30 mg by mouth daily.      . Ledipasvir-Sofosbuvir (HARVONI) 90-400 MG TABS Take 1 tablet by mouth daily.      Marland Kitchen lisinopril (PRINIVIL,ZESTRIL) 20 MG tablet Take 20 mg by mouth 2 (two) times daily.      . metoprolol (LOPRESSOR) 100 MG tablet Take 100 mg by mouth 2 (two) times daily.      . nitroGLYCERIN (NITROSTAT) 0.4 MG SL tablet Place 1 tablet (0.4 mg total) under the tongue every 5 (five) minutes as needed for chest pain.  25 tablet  6  . Omega-3 Fatty Acids (FISH OIL) 1200 MG CAPS Take 1,200 mg by mouth 2 (two) times daily.       . pravastatin (PRAVACHOL) 40 MG tablet Take 40 mg by mouth daily.      Marland Kitchen zolpidem (AMBIEN) 10 MG tablet Take 5-10 mg by mouth at bedtime as needed for sleep. 1/2 -1 every 3rd night prn only; do not take with alcohol      .  [DISCONTINUED] amLODipine (NORVASC) 10 MG tablet Take 1 tablet (10 mg total) by mouth daily.  90 tablet  0  . [DISCONTINUED] carvedilol (COREG) 25 MG tablet Take 1 tablet (25 mg total) by mouth 2 (two) times daily with a meal.  60 tablet  3   No current facility-administered medications for this visit.    No Known Allergies  Past Medical History  Diagnosis Date  . Hepatitis C     s/p interferon/[facility] Rx; chronically elevated LFTs  . Hyperlipidemia   . CAD (coronary artery disease)     LHC 09/11/11: 3 vessel CAD, EF 45-50%  . Hx of CABG 08/2011    Dr. Prescott Gum - LIMA to LAD, SVG-D1, SVG-distal RCA, SVG-RI/distal circumflex  . Postoperative atrial fibrillation 08/2011    Amiodarone  . Alcohol abuse   . Complication of anesthesia     slow to awaken  . PONV (postoperative nausea and vomiting)   . Family history of anesthesia complication   . Dysrhythmia     palpations at times  . Asthma     as a child   . GERD (gastroesophageal reflux disease)     Past Surgical History  Procedure  Laterality Date  . Orchiectomy      left for scar tissue post torsion; Dr Hartley Barefoot  . Tympanic membranes      perforation X 3 of  L  TM  ;surgically repaired  . Arthroscopic knee surgery      left  . Coronary artery bypass graft  09/12/2011    Procedure: CORONARY ARTERY BYPASS GRAFTING (CABG);  Surgeon: Ivin Poot, MD;  Location: [place];  Service: Open Heart Surgery;  Laterality: N/A;  . Colonoscopy  2005    negative; [facility]  . Esophagogastroduodenoscopy (egd) with propofol N/A 02/16/2014    Procedure: ESOPHAGOGASTRODUODENOSCOPY (EGD) WITH PROPOFOL;  Surgeon: Winfield Cunas., MD;  Location: [facility] END[facility];  Service: Endoscopy;  Laterality: N/A;  H&P in file    History  Smoking status  . Former Smoker -- 1.00 packs/day for 30 years  . Types: Cigarettes  . Quit date: 09/10/2003  Smokeless tobacco  . Not on file    Comment: smoked 1971- 2005 (off intermittently 1.5 years), up to 1 ppd      History  Alcohol Use  . 8.4 oz/week  . 14 Cans of beer per week    Comment: none since 4/20 /2015    Family History  Problem Relation Age of Onset  . Lung cancer Mother 73  . Heart attack Maternal Grandfather 54  . Hyperlipidemia Sister   . Diabetes Neg Hx   . Stroke Neg Hx     Review of Systems: The review of systems is per the HPI.  All other systems were reviewed and are negative.  Physical Exam: BP 130/80  Pulse 54  Ht 5\' 10"  (1.778 m)  Wt 163 lb (73.936 kg)  BMI 23.39 kg/m2  SpO2 100% Patient is very pleasant and in no acute distress. Skin is warm and dry. Color is normal.  HEENT is unremarkable. Normocephalic/atraumatic. PERRL. Sclera are nonicteric. Neck is supple. No masses. No JVD. Lungs are clear. Cardiac exam shows a regular rate and rhythm. HR 56 on exam by me. Abdomen is soft. Extremities are without edema. Gait and ROM are intact. No gross neurologic deficits noted.  Wt Readings from Last 3 Encounters:  02/18/14 163 lb (73.936 kg)  02/16/14 165 lb (74.844 kg)  02/16/14 165 lb (74.844 kg)    LABORATORY DATA/PROCEDURES:  Lab Results  Component Value Date   WBC 5.5 01/14/2013   HGB 15.6 01/14/2013   HCT 45.0 01/14/2013   PLT 120.0* 01/14/2013   GLUCOSE 105* 01/14/2013   CHOL 175 01/14/2013   TRIG 104.0 01/14/2013   HDL 46.10 01/14/2013   LDLDIRECT 146.5 11/29/2009   LDLCALC 108* 01/14/2013   ALT 196* 01/14/2013   AST 185* 01/14/2013   NA 136 01/14/2013   K 3.9 01/14/2013   CL 98 01/14/2013   CREATININE 0.8 01/14/2013   BUN 13 01/14/2013   CO2 32 01/14/2013   TSH 1.03 01/14/2013   INR 1.42 09/12/2011   HGBA1C 5.8 01/10/2012    BNP (last 3 results) No results found for this basename: PROBNP,  in the last 8760 hours   Assessment / Plan: 1. CAD - prior CABG - doing well clinically.  2. HTN - BP is great - 120/80 by me.  3. HLD - on statin  4. Hepatitis - treated successfully  5. Resting bradycardia - he is on 200 mg of lopressor - I am changing over to  Coreg but at a lower equivalent - I have started him on 12.5 mg  BID. He is to call if he does not tolerate. Otherwise we will see him in December with fasting labs.   Patient is agreeable to this plan and will call if any problems develop in the interim.   Burtis Junes, RN, [place] [address] [place], [place]  [phone]

## 2014-02-18 NOTE — Patient Instructions (Addendum)
See Dr. Percival Spanish in December with fasting labs  Stop Metoprolol  Start Coreg 12.5 mg two times a day - this has been sent to the drug store.  Call us if you have any problems with this medicine change  Call the [place] office at [phone] if you have any questions, problems or concerns.

## 2014-02-23 ENCOUNTER — Telehealth: Payer: Self-pay | Admitting: Cardiology

## 2014-02-23 NOTE — Telephone Encounter (Signed)
Pt called in stating that Manuel Bell prescribed Carvidelol 12.5 for him and he stated that he is having some side effects . He is having full body aches and some tightness in his chest. Please call  Thanks

## 2014-02-23 NOTE — Telephone Encounter (Signed)
Message has been forwarded to primary cardiologist - will also forward to DOD (Dr. Sallyanne Kuster)

## 2014-02-23 NOTE — Telephone Encounter (Signed)
Message routed to [facility] triage pool.

## 2014-02-23 NOTE — Telephone Encounter (Signed)
Returned call to patient. He states that since switching from lopressor to coreg he has noticed chest tightness with walking and feels a little "loopy" with no energy. He was concerned there may be an interaction with harvoni, but when consulting with pharmacist Erasmo Downer, if there was an interaction with on B-blocker it would probably be will all B-blockers. He was tolerating metoprolol fine. He does report his BP is "up a little" but no readings were given. When asked about his HR, he did not have any information to provide on this either.   Patient denies SOB/lightheadedness/dizziness/edema  Will defer to [place], NP/Dr. Percival Spanish - as per her note it stated he was to call back if had SE

## 2014-02-25 ENCOUNTER — Telehealth: Payer: Self-pay | Admitting: Cardiology

## 2014-02-25 ENCOUNTER — Other Ambulatory Visit: Payer: Self-pay | Admitting: Internal Medicine

## 2014-02-25 ENCOUNTER — Telehealth: Payer: Self-pay | Admitting: Internal Medicine

## 2014-02-25 MED ORDER — ZOLPIDEM TARTRATE 10 MG PO TABS: ORAL_TABLET | ORAL | Status: DC

## 2014-02-25 NOTE — Telephone Encounter (Signed)
Pt is requesting re-fill on zolpidem (AMBIEN) 10 MG tablet [id number] - [place], [place] - [address] AT [address]

## 2014-02-25 NOTE — Telephone Encounter (Signed)
Manuel Bell is calling about some medication (Carvediolol) having some side effects and have questions, also concerned about the liver problems it may cause .Marland KitchenPlease call   Thanks

## 2014-02-25 NOTE — Telephone Encounter (Signed)
Pt having a lot of side effect with taking carvediolol and is cocerned  About the liver involvement it has

## 2014-02-25 NOTE — Telephone Encounter (Signed)
#  10 1 q 3rd night prn only OV needed before refills

## 2014-02-25 NOTE — Telephone Encounter (Signed)
A user error has taken place.

## 2014-02-26 NOTE — Telephone Encounter (Signed)
This was the beta blocker suggested by his liver doctors.  He can reduce to 6.25 mg bid.

## 2014-03-01 NOTE — Telephone Encounter (Signed)
Pt informed to cut pills in half and only take 6.25 mg bib

## 2014-03-01 NOTE — Telephone Encounter (Signed)
Eliezer Lofts, Looks like Dr. Percival Spanish addressed this by cutting the Coreg back to 6.25 mg BID.  Please let me know if further assistance is needed.

## 2014-03-04 ENCOUNTER — Telehealth: Payer: Self-pay | Admitting: Cardiology

## 2014-03-04 NOTE — Telephone Encounter (Signed)
Patient called stating Carvedilol is now causing leg cramps in the middle of the night along with the joint and muscle pain he already had. States he took this several years back and it caused the same symptoms. Will send to dr. Percival Spanish for review and advise.

## 2014-03-04 NOTE — Telephone Encounter (Signed)
Please call,having problem with Corvedilol.

## 2014-03-05 NOTE — Telephone Encounter (Signed)
He can go back to metoprolol but a lower dose 50 mg bid and stop the carvedilol.

## 2014-03-07 NOTE — Telephone Encounter (Signed)
Spoke with pt, aware of dr hochrein's recommendations. He is not at [facility] at present and he states he has a lot of metoprolol at home but is unsure of the dose. He will call me back tomorrow so we can look at the metoprolol he has to make sure he takes the right dosage. Pt agreed with this plan.

## 2014-03-08 ENCOUNTER — Telehealth: Payer: Self-pay | Admitting: Cardiology

## 2014-03-08 MED ORDER — METOPROLOL TARTRATE 50 MG PO TABS
50.0000 mg | ORAL_TABLET | Freq: Two times a day (BID) | ORAL | Status: DC
Start: 2014-03-08 — End: 2014-04-15

## 2014-03-08 NOTE — Telephone Encounter (Signed)
Spoke with pt, he is aware to start back the metoprolol at 50 mg bid. He will track his bp and if no problems will follow up in dec as scheduled.

## 2014-03-08 NOTE — Telephone Encounter (Signed)
See telephone note from 03-07-14

## 2014-03-08 NOTE — Addendum Note (Signed)
Addended by: Cristopher Estimable on: 03/08/2014 11:23 AM   Modules accepted: Orders, Medications

## 2014-03-08 NOTE — Telephone Encounter (Signed)
Pt called wanting to let Hilda Blades know that he is now taking Metoprolol 200mg  1tab tid. He also stated that back in 2013 he was taking 2 tabs of 25mg  per day. Please call  Thanks

## 2014-04-14 ENCOUNTER — Other Ambulatory Visit: Payer: Self-pay | Admitting: Cardiology

## 2014-04-15 ENCOUNTER — Other Ambulatory Visit: Payer: Self-pay

## 2014-04-15 MED ORDER — METOPROLOL TARTRATE 50 MG PO TABS: 50.0000 mg | ORAL_TABLET | Freq: Two times a day (BID) | ORAL | Status: DC

## 2014-05-27 ENCOUNTER — Other Ambulatory Visit: Payer: Self-pay | Admitting: Internal Medicine

## 2014-05-27 NOTE — Telephone Encounter (Signed)
Done hardcopy to robin  

## 2014-05-27 NOTE — Telephone Encounter (Signed)
[facility] to [facility] [address] 

## 2014-05-27 NOTE — Telephone Encounter (Signed)
MD out pls advise on refill...lmb 

## 2014-05-29 ENCOUNTER — Other Ambulatory Visit: Payer: Self-pay | Admitting: Cardiology

## 2014-05-30 ENCOUNTER — Other Ambulatory Visit: Payer: Self-pay

## 2014-05-30 MED ORDER — LANSOPRAZOLE 30 MG PO CPDR: 30.0000 mg | DELAYED_RELEASE_CAPSULE | Freq: Every day | ORAL | Status: DC

## 2014-05-31 ENCOUNTER — Ambulatory Visit: Payer: BC Managed Care – PPO | Admitting: Cardiology

## 2014-06-03 ENCOUNTER — Ambulatory Visit (INDEPENDENT_AMBULATORY_CARE_PROVIDER_SITE_OTHER): Payer: BC Managed Care – PPO | Admitting: Cardiology

## 2014-06-03 ENCOUNTER — Encounter: Payer: Self-pay | Admitting: Cardiology

## 2014-06-03 VITALS — BP 134/80 | HR 53 | Ht 69.0 in | Wt 174.0 lb

## 2014-06-03 DIAGNOSIS — E785 Hyperlipidemia, unspecified: Secondary | ICD-10-CM

## 2014-06-03 DIAGNOSIS — I25810 Atherosclerosis of coronary artery bypass graft(s) without angina pectoris: Secondary | ICD-10-CM

## 2014-06-03 LAB — LIPID PANEL
CHOLESTEROL: 158 mg/dL (ref 0–200)
HDL: 51 mg/dL (ref >39)
LDL CALC: 94 mg/dL (ref 0–99)
Total CHOL/HDL Ratio: 3.1 Ratio
Triglycerides: 66 mg/dL (ref <150)
VLDL: 13 mg/dL (ref 0–40)

## 2014-06-03 NOTE — Progress Notes (Signed)
HPI The patient returns after bypass surgery in 2013.  Since I last saw him he is doing well.  He denies any ongoing chest discomfort. He is active at work. He says with this level of activity he's not having any shortness of breath, PND or orthopnea. He's not having any palpitations, presyncope or syncope. He's had no weight gain or edema. He does have some cramping which she relates to the pravastatin.  No Known Allergies  Current Outpatient Prescriptions  Medication Sig Dispense Refill  . acyclovir ointment (ZOVIRAX) 5 % Apply 1 application topically as needed (for fever blisters).    . hydrochlorothiazide (HYDRODIURIL) 25 MG tablet Take 25 mg by mouth daily.    . lansoprazole (PREVACID) 30 MG capsule Take 1 capsule (30 mg total) by mouth daily. 90 capsule 1  . lisinopril (PRINIVIL,ZESTRIL) 20 MG tablet Take 20 mg by mouth 2 (two) times daily.    . metoprolol (LOPRESSOR) 50 MG tablet Take 1 tablet (50 mg total) by mouth 2 (two) times daily. 180 tablet 3  . nitroGLYCERIN (NITROSTAT) 0.4 MG SL tablet Place 1 tablet (0.4 mg total) under the tongue every 5 (five) minutes as needed for chest pain. 25 tablet 6  . Omega-3 Fatty Acids (FISH OIL) 1200 MG CAPS Take 1,200 mg by mouth 2 (two) times daily.     . pravastatin (PRAVACHOL) 40 MG tablet TAKE 1 TABLET BY MOUTH EVERY EVENING 30 tablet 6  . zolpidem (AMBIEN) 10 MG tablet TAKE 1 TABLET BY MOUTH EVERY 3RD NIGHT ONLY AS NEEDED 10 tablet 0  . [DISCONTINUED] amLODipine (NORVASC) 10 MG tablet Take 1 tablet (10 mg total) by mouth daily. 90 tablet 0   No current facility-administered medications for this visit.    Past Medical History  Diagnosis Date  . Hepatitis C     s/p interferon/[facility]; chronically elevated LFTs  . Hyperlipidemia   . CAD (coronary artery disease)     LHC 09/11/11: 3 vessel CAD, EF 45-50%  . Hx of CABG 08/2011    Dr. Prescott Gum - LIMA to LAD, SVG-D1, SVG-distal RCA, SVG-RI/distal circumflex  . Postoperative atrial  fibrillation 08/2011    Amiodarone  . Alcohol abuse   . Complication of anesthesia     slow to awaken  . PONV (postoperative nausea and vomiting)   . Family history of anesthesia complication   . Dysrhythmia     palpations at times  . Asthma     as a child   . GERD (gastroesophageal reflux disease)     Past Surgical History  Procedure Laterality Date  . Orchiectomy      left for scar tissue post torsion; Dr Hartley Barefoot  . Tympanic membranes      perforation X 3 of  L  TM  ;surgically repaired  . Arthroscopic knee surgery      left  . Coronary artery bypass graft  09/12/2011    Procedure: CORONARY ARTERY BYPASS GRAFTING (CABG);  Surgeon: Ivin Poot, MD;  Location: [place];  Service: Open Heart Surgery;  Laterality: N/A;  . Colonoscopy  2005    negative; [facility]  . Esophagogastroduodenoscopy (egd) with propofol N/A 02/16/2014    Procedure: ESOPHAGOGASTRODUODENOSCOPY (EGD) WITH PROPOFOL;  Surgeon: Winfield Cunas., MD;  Location: [facility] ENDOSCOPY;  Service: Endoscopy;  Laterality: N/A;  H&P in file   ROS:  As stated in the HPI and negative for all other systems.  PHYSICAL EXAM BP 134/80 mmHg  Pulse 53  Ht 5\' 9"  (1.753 m)  Wt 174 lb (78.926 kg)  BMI 25.68 kg/m2 GENERAL:  Well appearing NECK:  No jugular venous distention, waveform within normal limits, carotid upstroke brisk and symmetric, no bruits, no thyromegaly LUNGS:  Clear to auscultation bilaterally CHEST:  Well healed sternotomy scar. HEART:  PMI not displaced or sustained,S1 and S2 within normal limits, no S3, no S4, no clicks, no rubs, no murmurs ABD:  Flat, positive bowel sounds normal in frequency in pitch, no bruits, no rebound, no guarding, no midline pulsatile mass, no hepatomegaly, no splenomegaly EXT:  2 plus pulses throughout, no edema, no cyanosis no clubbing SKIN:  No rashes no nodules  EKG:  Sinus rhythm, rate 53, axis within normal limits, intervals within normal limits, no acute ST-T wave changes.   06/03/2014  ASSESSMENT AND PLAN  CABG  -  The patient continues to do well status post bypass. No change in therapy is indicated.  He will continue with risk reduction.   HYPERTENSION - The blood pressure is at target. No change in medications is indicated. We will continue with therapeutic lifestyle changes (TLC).  HYPERLIPIDEMIA -  His LDL was 108 in July of 2014.  He has not had his labs checked since then and we will get them checked today.   ALCOHOL - He still drinks some alcohol.

## 2014-06-03 NOTE — Patient Instructions (Signed)
Your physician recommends that you schedule a follow-up appointment in: one year with Dr. Percival Spanish  We are ordering a lipid test

## 2014-06-28 ENCOUNTER — Other Ambulatory Visit: Payer: Self-pay | Admitting: *Deleted

## 2014-06-28 MED ORDER — HYDROCHLOROTHIAZIDE 25 MG PO TABS: 25.0000 mg | ORAL_TABLET | Freq: Every day | ORAL | Status: DC

## 2014-07-19 ENCOUNTER — Other Ambulatory Visit (HOSPITAL_COMMUNITY): Payer: Self-pay | Admitting: Nurse Practitioner

## 2014-07-19 DIAGNOSIS — B1921 Unspecified viral hepatitis C with hepatic coma: Secondary | ICD-10-CM

## 2014-07-19 DIAGNOSIS — B182 Chronic viral hepatitis C: Secondary | ICD-10-CM

## 2014-08-04 ENCOUNTER — Other Ambulatory Visit: Payer: Self-pay | Admitting: Cardiology

## 2014-08-07 ENCOUNTER — Other Ambulatory Visit: Payer: Self-pay | Admitting: Cardiology

## 2014-08-23 ENCOUNTER — Ambulatory Visit (HOSPITAL_COMMUNITY)
Admission: RE | Admit: 2014-08-23 | Discharge: 2014-08-23 | Disposition: A | Discharge status: Home / Self Care | Payer: BLUE CROSS/BLUE SHIELD | Source: Ambulatory Visit | Attending: Nurse Practitioner | Admitting: Nurse Practitioner

## 2014-08-23 DIAGNOSIS — B182 Chronic viral hepatitis C: Secondary | ICD-10-CM | POA: Diagnosis present

## 2014-08-23 DIAGNOSIS — B1921 Unspecified viral hepatitis C with hepatic coma: Secondary | ICD-10-CM

## 2014-09-19 ENCOUNTER — Other Ambulatory Visit: Payer: Self-pay | Admitting: Internal Medicine

## 2014-09-23 ENCOUNTER — Other Ambulatory Visit: Payer: Self-pay | Admitting: Internal Medicine

## 2014-09-23 MED ORDER — ZOLPIDEM TARTRATE 10 MG PO TABS: ORAL_TABLET | ORAL | Status: DC

## 2014-09-23 NOTE — Telephone Encounter (Signed)
Faxed script back to [facility].../lmb 

## 2014-09-23 NOTE — Telephone Encounter (Signed)
Done hardcopy to Cherina  

## 2014-10-03 ENCOUNTER — Other Ambulatory Visit: Payer: Self-pay | Admitting: Cardiology

## 2014-11-09 ENCOUNTER — Other Ambulatory Visit: Payer: Self-pay | Admitting: Cardiology

## 2014-11-22 ENCOUNTER — Other Ambulatory Visit: Payer: Self-pay | Admitting: Internal Medicine

## 2014-12-01 ENCOUNTER — Telehealth: Payer: Self-pay | Admitting: Emergency Medicine

## 2014-12-01 NOTE — Telephone Encounter (Signed)
Informed pt that for a refill for Lansoprazole he would need an office visit. Pt advised that he would use OTC Nexium instead of coming in for an office visit.

## 2015-02-18 ENCOUNTER — Other Ambulatory Visit: Payer: Self-pay | Admitting: Cardiology

## 2015-02-28 ENCOUNTER — Other Ambulatory Visit: Payer: Self-pay | Admitting: Adult Health

## 2015-03-10 ENCOUNTER — Telehealth: Payer: Self-pay | Admitting: Cardiology

## 2015-03-10 NOTE — Telephone Encounter (Signed)
Pt have appt to see Kerin Ransom 09/20 @ 1:30

## 2015-03-10 NOTE — Telephone Encounter (Signed)
Pt called in stating that due to his insurance they are requiring that he have a "vitality test" which involves blood work among other things which will save him $400 to $500 a year on his insurance. He needs this done before 11/11 so he wants to speak with the nurse about setting these things up. Please call and and assist  Thanks

## 2015-03-14 ENCOUNTER — Ambulatory Visit: Payer: BLUE CROSS/BLUE SHIELD | Admitting: Cardiology

## 2015-03-17 ENCOUNTER — Ambulatory Visit: Payer: BLUE CROSS/BLUE SHIELD | Admitting: Cardiology

## 2015-05-09 ENCOUNTER — Other Ambulatory Visit: Payer: Self-pay | Admitting: Nurse Practitioner

## 2015-05-09 DIAGNOSIS — C220 Liver cell carcinoma: Secondary | ICD-10-CM

## 2015-05-15 ENCOUNTER — Ambulatory Visit
Admission: RE | Admit: 2015-05-15 | Discharge: 2015-05-15 | Disposition: A | Discharge status: Home / Self Care | Payer: BLUE CROSS/BLUE SHIELD | Source: Ambulatory Visit | Attending: Nurse Practitioner | Admitting: Nurse Practitioner

## 2015-05-15 DIAGNOSIS — C220 Liver cell carcinoma: Secondary | ICD-10-CM

## 2015-05-25 DIAGNOSIS — R112 Nausea with vomiting, unspecified: Secondary | ICD-10-CM | POA: Insufficient documentation

## 2015-05-25 DIAGNOSIS — K219 Gastro-esophageal reflux disease without esophagitis: Secondary | ICD-10-CM | POA: Insufficient documentation

## 2015-05-25 DIAGNOSIS — F1010 Alcohol abuse, uncomplicated: Secondary | ICD-10-CM | POA: Insufficient documentation

## 2015-05-25 DIAGNOSIS — Z8489 Family history of other specified conditions: Secondary | ICD-10-CM | POA: Insufficient documentation

## 2015-05-25 DIAGNOSIS — B1920 Unspecified viral hepatitis C without hepatic coma: Secondary | ICD-10-CM | POA: Insufficient documentation

## 2015-05-25 DIAGNOSIS — T8859XA Other complications of anesthesia, initial encounter: Secondary | ICD-10-CM | POA: Insufficient documentation

## 2015-05-25 DIAGNOSIS — J45909 Unspecified asthma, uncomplicated: Secondary | ICD-10-CM | POA: Insufficient documentation

## 2015-05-25 DIAGNOSIS — E785 Hyperlipidemia, unspecified: Secondary | ICD-10-CM | POA: Insufficient documentation

## 2015-05-25 DIAGNOSIS — T4145XA Adverse effect of unspecified anesthetic, initial encounter: Secondary | ICD-10-CM | POA: Insufficient documentation

## 2015-05-25 DIAGNOSIS — Z9889 Other specified postprocedural states: Secondary | ICD-10-CM

## 2015-05-29 ENCOUNTER — Ambulatory Visit (INDEPENDENT_AMBULATORY_CARE_PROVIDER_SITE_OTHER): Payer: BLUE CROSS/BLUE SHIELD | Admitting: Cardiology

## 2015-05-29 ENCOUNTER — Encounter: Payer: Self-pay | Admitting: Cardiology

## 2015-05-29 VITALS — BP 158/82 | HR 79 | Ht 69.0 in | Wt 186.4 lb

## 2015-05-29 DIAGNOSIS — I2510 Atherosclerotic heart disease of native coronary artery without angina pectoris: Secondary | ICD-10-CM | POA: Diagnosis not present

## 2015-05-29 MED ORDER — LANSOPRAZOLE 30 MG PO CPDR: 30.0000 mg | DELAYED_RELEASE_CAPSULE | Freq: Every day | ORAL | Status: DC

## 2015-05-29 MED ORDER — ZOLPIDEM TARTRATE 10 MG PO TABS: ORAL_TABLET | ORAL | Status: DC

## 2015-05-29 MED ORDER — NITROGLYCERIN 0.4 MG SL SUBL: 0.4000 mg | SUBLINGUAL_TABLET | SUBLINGUAL | Status: DC | PRN

## 2015-05-29 NOTE — Progress Notes (Signed)
HPI The patient returns after bypass surgery in 2013.  Since I last saw him Manuel Bell is doing well.  Manuel Bell denies any ongoing chest discomfort. Manuel Bell is active at work although Manuel Bell is not exercising as much as Manuel Bell used to.   Manuel Bell says with this level of activity Manuel Bell's not having any shortness of breath, PND or orthopnea. Manuel Bell's not having any palpitations, presyncope or syncope.  Manuel Bell still does have some cramping which Manuel Bell relates to the pravastatin.  Of note Manuel Bell has been cleared of his hepatitis C.  No Known Allergies  Current Outpatient Prescriptions  Medication Sig Dispense Refill  . acyclovir ointment (ZOVIRAX) 5 % Apply 1 application topically as needed (for fever blisters).    Marland Kitchen aspirin 81 MG tablet Take 81 mg by mouth daily.    . hydrochlorothiazide (HYDRODIURIL) 25 MG tablet Take 1 tablet (25 mg total) by mouth daily. 30 tablet 11  . lisinopril (PRINIVIL,ZESTRIL) 20 MG tablet TAKE 1 TABLET BY MOUTH TWICE DAILY 60 tablet 3  . lisinopril (PRINIVIL,ZESTRIL) 20 MG tablet TAKE 1 TABLET BY MOUTH TWICE DAILY 60 tablet 3  . metoprolol (LOPRESSOR) 50 MG tablet TAKE 1 TABLET BY MOUTH TWICE A DAY 180 tablet 1  . nitroGLYCERIN (NITROSTAT) 0.4 MG SL tablet Place 1 tablet (0.4 mg total) under the tongue every 5 (five) minutes as needed for chest pain. 25 tablet 6  . Omega-3 Fatty Acids (FISH OIL) 1200 MG CAPS Take 1,200 mg by mouth 2 (two) times daily.     . pravastatin (PRAVACHOL) 40 MG tablet Take 1 tablet (40 mg total) by mouth every evening. 30 tablet 6  . zolpidem (AMBIEN) 10 MG tablet TAKE 1 TABLET BY MOUTH EVERY 3RD NIGHT *ONLY AS NEEDED * 10 tablet 5  . [DISCONTINUED] amLODipine (NORVASC) 10 MG tablet Take 1 tablet (10 mg total) by mouth daily. 90 tablet 0   No current facility-administered medications for this visit.    Past Medical History  Diagnosis Date  . Hepatitis C     s/p interferon/[facility]; chronically elevated LFTs  . Hyperlipidemia   . CAD (coronary artery disease)     LHC 09/11/11: 3  vessel CAD, EF 45-50%  . Hx of CABG 08/2011    Dr. Prescott Gum - LIMA to LAD, SVG-D1, SVG-distal RCA, SVG-RI/distal circumflex  . Postoperative atrial fibrillation ([place]) 08/2011    Amiodarone  . Alcohol abuse   . Complication of anesthesia     slow to awaken  . PONV (postoperative nausea and vomiting)   . Family history of anesthesia complication   . Dysrhythmia     palpations at times  . Asthma     as a child   . GERD (gastroesophageal reflux disease)   . Coronary atherosclerosis of native coronary artery 10/02/2011  . HYPERLIPIDEMIA 11/22/2009    Qualifier: Diagnosis of  By: Linna Darner MD, Gwyndolyn Saxon   Pravastatin 40 mg started post CBAG 08/2011      . HYPERTENSION, ESSENTIAL NOS 08/20/2007    Qualifier: Diagnosis of  By: Linna Darner MD, Monia Pouch, VIRAL HEPATITIS C 01/28/2007    Qualifier: Diagnosis of  By: Linna Darner MD, Carmell Austria, TRANSAMINASE/LDH LEVELS 02/06/2007    Qualifier: Diagnosis of  By: Janelle Floor    . LUMBAR RADICULOPATHY, RIGHT 01/02/2010    Qualifier: Diagnosis of  By: Linna Darner MD, Gwyndolyn Saxon    . Other abnormal glucose 01/07/2012    Fasting blood sugar ranged 103-116 hospitalized  March/April 2013.   . S/P CABG x 5 09/24/2011    09/12/2011   . NEURALGIA 11/22/2009    Qualifier: Diagnosis of  By: Linna Darner MD, Gwyndolyn Saxon    . GERD 10/19/2008    Qualifier: Diagnosis of  By: Linna Darner MD, Gwyndolyn Saxon      Past Surgical History  Procedure Laterality Date  . Orchiectomy      left for scar tissue post torsion; Dr Hartley Barefoot  . Tympanic membranes      perforation X 3 of  L  TM  ;surgically repaired  . Arthroscopic knee surgery      left  . Coronary artery bypass graft  09/12/2011    Procedure: CORONARY ARTERY BYPASS GRAFTING (CABG);  Surgeon: Ivin Poot, MD;  Location: [place];  Service: Open Heart Surgery;  Laterality: N/A;  . Colonoscopy  2005    negative; [facility]  . Esophagogastroduodenoscopy (egd) with propofol N/A 02/16/2014    Procedure: ESOPHAGOGASTRODUODENOSCOPY (EGD)  WITH PROPOFOL;  Surgeon: Winfield Cunas., MD;  Location: [facility] ENDOSCOPY;  Service: Endoscopy;  Laterality: N/A;  H&P in file   ROS:  As stated in the HPI and negative for all other systems.  PHYSICAL EXAM BP 158/82 mmHg  Pulse 79  Ht 5\' 9"  (1.753 m)  Wt 186 lb 6.4 oz (84.55 kg)  BMI 27.51 kg/m2 GENERAL:  Well appearing NECK:  No jugular venous distention, waveform within normal limits, carotid upstroke brisk and symmetric, no bruits, no thyromegaly LUNGS:  Clear to auscultation bilaterally CHEST:  Well healed sternotomy scar. HEART:  PMI not displaced or sustained,S1 and S2 within normal limits, no S3, no S4, no clicks, no rubs, no murmurs ABD:  Flat, positive bowel sounds normal in frequency in pitch, no bruits, no rebound, no guarding, no midline pulsatile mass, no hepatomegaly, no splenomegaly EXT:  2 plus pulses throughout, no edema, no cyanosis no clubbing SKIN:  No rashes no nodules  EKG:  Sinus rhythm, rate 79, axis within normal limits, intervals within normal limits, no acute ST-T wave changes. Premature ectopic complexes with probable junctional beats and fusion complexes.  The morning 05/29/2015  ASSESSMENT AND PLAN  CABG  -  The patient continues to do well status post bypass. No change in therapy is indicated.  Manuel Bell will continue with risk reduction.   HYPERTENSION - The blood pressure is mildly elevated.  However it is not elevated at home. No change in medications is indicated. We will continue with therapeutic lifestyle changes (TLC).  CRAMPING - Manuel Bell is going to give himself about 2 months off of the Pravastatin to see if this helps.    HYPERLIPIDEMIA -  Manuel Bell reports having a recent lipid profile and that his levels were OK.  Manuel Bell will get me these results.  Lab Results  Component Value Date   CHOL 158 06/03/2014   TRIG 66 06/03/2014   HDL 51 06/03/2014   LDLCALC 94 06/03/2014   LDLDIRECT 146.5 11/29/2009   ALCOHOL - Manuel Bell still drinks some alcohol.

## 2015-05-29 NOTE — Patient Instructions (Signed)
Your physician wants you to follow-up in: 1 Year. You will receive a reminder letter in the mail two months in advance. If you don't receive a letter, please call our office to schedule the follow-up appointment.  

## 2015-06-24 ENCOUNTER — Other Ambulatory Visit: Payer: Self-pay | Admitting: Cardiology

## 2015-06-24 NOTE — Telephone Encounter (Signed)
Rx(s) sent to pharmacy electronically.  

## 2015-07-10 ENCOUNTER — Telehealth: Payer: Self-pay | Admitting: Cardiology

## 2015-07-10 NOTE — Progress Notes (Signed)
Cardiology Office Note    Date:  07/11/2015   ID:  Manuel Bell, DOB Apr 06, 1956, MRN [id number]  PCP:  No PCP Per Patient  Cardiologist:  Dr. Minus Breeding   Electrophysiologist:  n/a  Chief Complaint  Patient presents with  . Shortness of Breath    History of Present Illness:  Manuel Bell is a 60 y.o. male with a hx of CAD s/p CABG 2013, HTN, HL, alcohol use, HCV (resolved after treatment), elevated LFTs, EF 45-50% at time of CABG.  Last seen by Dr. Minus Breeding 05/29/15.  He presents today with complaints of exertional dyspnea and chest pain over the past 1 month.  He can bring on symptoms with mild to moderate activity.  He denies rest symptoms. He notes his symptoms are somewhat similar to his prior angina but not as bad.  He had a flu like illness several weeks ago, but this has resolved.  He denies orthopnea, PND, edema.  Denies syncope. He has not taken NTG for his symptoms.     Past Medical History  Diagnosis Date  . Hepatitis C     s/p interferon/[facility]; chronically elevated LFTs  . Hyperlipidemia   . CAD (coronary artery disease)     LHC 09/11/11: 3 vessel CAD, EF 45-50%  . Hx of CABG 08/2011    Dr. Prescott Gum - LIMA to LAD, SVG-D1, SVG-distal RCA, SVG-RI/distal circumflex  . Postoperative atrial fibrillation ([place]) 08/2011    Amiodarone  . Alcohol abuse   . Complication of anesthesia     slow to awaken  . PONV (postoperative nausea and vomiting)   . Family history of anesthesia complication   . Dysrhythmia     palpations at times  . Asthma     as a child   . GERD (gastroesophageal reflux disease)   . Coronary atherosclerosis of native coronary artery 10/02/2011  . HYPERLIPIDEMIA 11/22/2009    Qualifier: Diagnosis of  By: Linna Darner MD, Gwyndolyn Saxon   Pravastatin 40 mg started post CBAG 08/2011      . HYPERTENSION, ESSENTIAL NOS 08/20/2007    Qualifier: Diagnosis of  By: Linna Darner MD, Monia Pouch, VIRAL HEPATITIS C 01/28/2007    Qualifier: Diagnosis of  By:  Linna Darner MD, Carmell Austria, TRANSAMINASE/LDH LEVELS 02/06/2007    Qualifier: Diagnosis of  By: Janelle Floor    . LUMBAR RADICULOPATHY, RIGHT 01/02/2010    Qualifier: Diagnosis of  By: Linna Darner MD, Gwyndolyn Saxon    . Other abnormal glucose 01/07/2012    Fasting blood sugar ranged 103-116 hospitalized March/April 2013.   . S/P CABG x 5 09/24/2011    09/12/2011   . NEURALGIA 11/22/2009    Qualifier: Diagnosis of  By: Linna Darner MD, Gwyndolyn Saxon    . GERD 10/19/2008    Qualifier: Diagnosis of  By: Linna Darner MD, Gwyndolyn Saxon      Past Surgical History  Procedure Laterality Date  . Orchiectomy      left for scar tissue post torsion; Dr Hartley Barefoot  . Tympanic membranes      perforation X 3 of  L  TM  ;surgically repaired  . Arthroscopic knee surgery      left  . Coronary artery bypass graft  09/12/2011    Procedure: CORONARY ARTERY BYPASS GRAFTING (CABG);  Surgeon: Ivin Poot, MD;  Location: [facility];  Service: Open Heart Surgery;  Laterality: N/A;  . Colonoscopy  2005    negative; [facility]  .  Esophagogastroduodenoscopy (egd) with propofol N/A 02/16/2014    Procedure: ESOPHAGOGASTRODUODENOSCOPY (EGD) WITH PROPOFOL;  Surgeon: Winfield Cunas., MD;  Location: [facility] ENDOSCOPY;  Service: Endoscopy;  Laterality: N/A;  H&P in file    Current Medications: Outpatient Prescriptions Prior to Visit  Medication Sig Dispense Refill  . acyclovir ointment (ZOVIRAX) 5 % Apply 1 application topically as needed (for fever blisters).    Marland Kitchen aspirin 81 MG tablet Take 81 mg by mouth daily.    . hydrochlorothiazide (HYDRODIURIL) 25 MG tablet Take 1 tablet (25 mg total) by mouth daily. 30 tablet 11  . lansoprazole (PREVACID) 30 MG capsule Take 1 capsule (30 mg total) by mouth daily. 90 capsule 1  . lisinopril (PRINIVIL,ZESTRIL) 20 MG tablet TAKE 1 TABLET BY MOUTH TWICE DAILY 60 tablet 3  . metoprolol (LOPRESSOR) 50 MG tablet TAKE 1 TABLET BY MOUTH TWICE A DAY 180 tablet 1  . Omega-3 Fatty Acids (FISH OIL) 1200 MG CAPS Take 1,200  mg by mouth 2 (two) times daily.     . pravastatin (PRAVACHOL) 40 MG tablet Take 1 tablet (40 mg total) by mouth every evening. 30 tablet 6  . zolpidem (AMBIEN) 10 MG tablet TAKE 1 TABLET BY MOUTH EVERY 3RD NIGHT *ONLY AS NEEDED * 30 tablet 0  . nitroGLYCERIN (NITROSTAT) 0.4 MG SL tablet Place 1 tablet (0.4 mg total) under the tongue every 5 (five) minutes as needed for chest pain. 25 tablet 2   No facility-administered medications prior to visit.      Allergies:   Sulfa antibiotics   Social History   Social History  . Marital Status: Single    Spouse Name: N/A  . Number of Children: N/A  . Years of Education: N/A   Social History Main Topics  . Smoking status: Former Smoker -- 1.00 packs/day for 30 years    Types: Cigarettes    Quit date: 09/10/2003  . Smokeless tobacco: None     Comment: smoked 1971- 2005 (off intermittently 1.5 years), up to 1 ppd   . Alcohol Use: 8.4 oz/week    14 Cans of beer per week     Comment: none since 4/20 /2015  . Drug Use: Yes    Special: Marijuana     Comment: last time 01/2014- early  in August  . Sexual Activity: Not Asked   Other Topics Concern  . None   Social History Narrative   Lives alone.     Family History:  The patient's family history includes Heart attack (age of onset: 52) in his maternal grandfather; Hyperlipidemia in his sister; Lung cancer (age of onset: 39) in his mother. There is no history of Diabetes or Stroke.   ROS:   Please see the history of present illness.    Review of Systems  Constitution: Positive for chills and decreased appetite.  Cardiovascular: Positive for chest pain and dyspnea on exertion.  Musculoskeletal: Positive for joint pain.  Psychiatric/Behavioral: The patient is nervous/anxious.   All other systems reviewed and are negative.   PHYSICAL EXAM:   VS:  BP 115/82 mmHg  Pulse 72  Ht 5\' 9"  (1.753 m)  Wt 185 lb 6.4 oz (84.097 kg)  BMI 27.37 kg/m2   GEN: Well nourished, well developed, in no  acute distress HEENT: normal Neck: no JVD, no masses Cardiac: Normal S1/S2, RRR; no murmurs, rubs, or gallops, no edema;  no carotid bruits,   Respiratory:  clear to auscultation bilaterally; no wheezing, rhonchi or rales GI: soft, nontender,  nondistended, + BS MS: no deformity or atrophy Skin: warm and dry, no rash Neuro: no focal deficits  Psych: Alert and oriented x 3, normal affect  Wt Readings from Last 3 Encounters:  07/11/15 185 lb 6.4 oz (84.097 kg)  05/29/15 186 lb 6.4 oz (84.55 kg)  06/03/14 174 lb (78.926 kg)      Studies/Labs Reviewed:   EKG:  EKG is  ordered today.  The ekg ordered today demonstrates NSR, HR 69, normal axis, QTc 437 ms, no significant change when compared to prior tracings.   Recent Labs: No results found for requested labs within last 365 days.   Recent Lipid Panel    Component Value Date/Time   CHOL 158 06/03/2014 0841   TRIG 66 06/03/2014 0841   HDL 51 06/03/2014 0841   CHOLHDL 3.1 06/03/2014 0841   VLDL 13 06/03/2014 0841   LDLCALC 94 06/03/2014 0841   LDLDIRECT 146.5 11/29/2009 0839    Additional studies/ records that were reviewed today include:   Carotid US 3/13 No evidence of significant ICA stenosis bilaterally.  LHC 3/13 Left main: No obstructive disease.  Left Anterior Descending Artery: Large caliber vessel that courses to the apex. The proximal and mid vessel is heavily calcified. The proximal vessel has a 99% stenosis. The mid vessel is diffusely disease with discreet 80% stenosis. The first diagonal branch has 100% proximal occlusion and is heavily calcified. This appears to be a moderate sized vessel. The second diagonal is a smaller caliber vessel with plaque disease. The distal LAD has luminal irregularities.  Circumflex Artery: Ramus intermediate branch with 70% proximal stenosis, moderate sized vessel. The mid Circumflex has a 90% stenosis at the takeoff of a small caliber marginal branch.  Right Coronary Artery:  Large, dominant vessel with 50% proximal stenosis, 70% mid stenosis. There is a 40% stenosis at the takeoff of the PDA.  Left Ventricular Angiogram: Hypokinesis of the anterior wall and apex. LVEF= 45-50%.  Impression: 1. Triple vessel CAD 2. Segmental LV systolic dysfunction 3. Unstable angina (chest pain on table with injections) Recommendations: We will admit the pt to telemetry. He has multi-vessel CAD with overall preserved LV function. I will review the films with Dr. Percival Spanish and will make further plans for revascularization. I think surgical revascularization is the best option given his severe LAD disease with heavy calcification, occluded Diagonal and moderate to severe disease in the other vessels.    ASSESSMENT:    1. Coronary artery disease involving native coronary artery of native heart with angina pectoris ([place])   2. Essential hypertension   3. Hyperlipidemia   4. History of tobacco abuse   5. Shortness of breath     PLAN:  In order of problems listed above:  1. CAD - He presents with chest symptoms and dyspnea with exertion similar to his prior angina. He has symptoms c/w CCS Class 2-3 angina. He has not tried NTG. His ECG does not have acute changes.  We discussed whether to proceed with stress testing vs cardia catheterization. Given his symptoms, I have recommended proceeding with cardiac cath.  I reviewed this with Dr. Cristopher Peru who also saw the patient and agreed.  Risks and benefits of cardiac catheterization have been discussed with the patient.  These include bleeding, infection, kidney damage, stroke, heart attack, death.  The patient understands these risks and is willing to proceed.   -  Arrange LHC in the next 1-2 weeks  -  Continue ASA, statin, beta-blocker.   -  FU with Dr. Minus Breeding 2 weeks post cath.   2. HTN - Controlled.  He notes leg cramps and takes HCTZ.  Will get BMET with pre-cath labs today to check K+.    3. HL - Continue statin.    4.  Hx of tobacco abuse - With his DOE, he is concerned about lung cancer.  It has been several years since he has had a CXR.  Will get a CXR as well.    Medication Adjustments/Labs and Tests Ordered: Current medicines are reviewed at length with the patient today.  Concerns regarding medicines are outlined above.  Medication changes, Labs and Tests ordered today are outlined in the Patient Instructions noted below. Patient Instructions  Medication Instructions:  Your physician recommends that you continue on your current medications as directed. Please refer to the Current Medication list given to you today.   Labwork: TODAY BMET, CBC W/DIFF, PT/INR  Testing/Procedures: Your physician has requested that you have a cardiac catheterization. Cardiac catheterization is used to diagnose and/or treat various heart conditions. Doctors may recommend this procedure for a number of different reasons. The most common reason is to evaluate chest pain. Chest pain can be a symptom of coronary artery disease (CAD), and cardiac catheterization can show whether plaque is narrowing or blocking your heart's arteries. This procedure is also used to evaluate the valves, as well as measure the blood flow and oxygen levels in different parts of your heart. For further information please visit HugeFiesta.tn. Please follow instruction sheet, as given.  A chest x-ray takes a picture of the organs and structures inside the chest, including the heart, lungs, and blood vessels. This test can show several things, including, whether the heart is enlarges; whether fluid is building up in the lungs; and whether pacemaker / defibrillator leads are still in place.   Follow-Up: 2 WEEKS DR. HOCHREIN POST CATH   Any Other Special Instructions Will Be Listed Below (If Applicable).  If you need a refill on your cardiac medications before your next appointment, please call your pharmacy.     Signed, Richardson Dopp, PA-C    07/11/2015 10:27 AM    [facility] [address], [place], [place]  [address] Phone: [phone]; Fax: [phone]    Cardiology Attending  Patient seen and examined. Agree with the findings as noted above. The represent my own. His exam reveals a RRR and his lungs are clear and his extremities demonstrate no edema.   A/P 1.  Crescendo angina - Will admit for cardiac catheterization as he has developed crescendo angina in the setting of known CAD, s/p CABG 3 years ago. He is currently pain free with a non-acute ECG 2. Dyslipidemia - he will continue his current meds.  3. HTN - his blood pressure has been well controlled. He will continue his current meds.   Mikle Bosworth.D.

## 2015-07-10 NOTE — Telephone Encounter (Signed)
Called pt. States SOB, esp on exertion. Happens intermittently and has been occurrent for about 2-3 weeks. He had recent "flu" or chest cold, notes this resolved but he has been having the SOB separate to that. He denies chest pressure or tightness. Does note occasional burning sensation in chest that seems to resolve spontaneously.  Pt was added for flex visit tomorrow AM. Told to call if new concerns. Pt knows to go to the ER if symptoms more severe.

## 2015-07-10 NOTE — Telephone Encounter (Signed)
Manuel Bell is calling because he is having some shortness of breath and it burns when it happens . Please call   Thanks

## 2015-07-11 ENCOUNTER — Encounter: Payer: Self-pay | Admitting: *Deleted

## 2015-07-11 ENCOUNTER — Ambulatory Visit (INDEPENDENT_AMBULATORY_CARE_PROVIDER_SITE_OTHER): Payer: BLUE CROSS/BLUE SHIELD | Admitting: Physician Assistant

## 2015-07-11 ENCOUNTER — Encounter: Payer: Self-pay | Admitting: Physician Assistant

## 2015-07-11 ENCOUNTER — Telehealth: Payer: Self-pay | Admitting: *Deleted

## 2015-07-11 ENCOUNTER — Ambulatory Visit
Admission: RE | Admit: 2015-07-11 | Discharge: 2015-07-11 | Disposition: A | Discharge status: Home / Self Care | Payer: BLUE CROSS/BLUE SHIELD | Source: Ambulatory Visit | Attending: Physician Assistant | Admitting: Physician Assistant

## 2015-07-11 VITALS — BP 115/82 | HR 72 | Ht 69.0 in | Wt 185.4 lb

## 2015-07-11 DIAGNOSIS — E785 Hyperlipidemia, unspecified: Secondary | ICD-10-CM

## 2015-07-11 DIAGNOSIS — Z87891 Personal history of nicotine dependence: Secondary | ICD-10-CM

## 2015-07-11 DIAGNOSIS — I10 Essential (primary) hypertension: Secondary | ICD-10-CM | POA: Diagnosis not present

## 2015-07-11 DIAGNOSIS — R0602 Shortness of breath: Secondary | ICD-10-CM

## 2015-07-11 DIAGNOSIS — I25119 Atherosclerotic heart disease of native coronary artery with unspecified angina pectoris: Secondary | ICD-10-CM | POA: Diagnosis not present

## 2015-07-11 LAB — CBC WITH DIFFERENTIAL/PLATELET
BASOS ABS: 0.1 10*3/uL (ref 0.0–0.1)
Basophils Relative: 1 % (ref 0–1)
Eosinophils Absolute: 0.2 10*3/uL (ref 0.0–0.7)
Eosinophils Relative: 2 % (ref 0–5)
HEMATOCRIT: 44.8 % (ref 39.0–52.0)
HEMOGLOBIN: 15.9 g/dL (ref 13.0–17.0)
LYMPHS ABS: 2 10*3/uL (ref 0.7–4.0)
LYMPHS PCT: 25 % (ref 12–46)
MCH: 33.9 pg (ref 26.0–34.0)
MCHC: 35.5 g/dL (ref 30.0–36.0)
MCV: 95.5 fL (ref 78.0–100.0)
MPV: 10.8 fL (ref 8.6–12.4)
Monocytes Absolute: 0.6 10*3/uL (ref 0.1–1.0)
Monocytes Relative: 8 % (ref 3–12)
NEUTROS ABS: 5.2 10*3/uL (ref 1.7–7.7)
NEUTROS PCT: 64 % (ref 43–77)
Platelets: 171 10*3/uL (ref 150–400)
RBC: 4.69 MIL/uL (ref 4.22–5.81)
RDW: 14 % (ref 11.5–15.5)
WBC: 8.1 10*3/uL (ref 4.0–10.5)

## 2015-07-11 LAB — BASIC METABOLIC PANEL
BUN: 15 mg/dL (ref 7–25)
CHLORIDE: 97 mmol/L — AB (ref 98–110)
CO2: 29 mmol/L (ref 20–31)
Calcium: 9.6 mg/dL (ref 8.6–10.3)
Creat: 0.84 mg/dL (ref 0.70–1.25)
Glucose, Bld: 92 mg/dL (ref 65–99)
POTASSIUM: 4.2 mmol/L (ref 3.5–5.3)
SODIUM: 135 mmol/L (ref 135–146)

## 2015-07-11 NOTE — Patient Instructions (Addendum)
Medication Instructions:  Your physician recommends that you continue on your current medications as directed. Please refer to the Current Medication list given to you today.   Labwork: TODAY BMET, CBC W/DIFF, PT/INR  Testing/Procedures: Your physician has requested that you have a cardiac catheterization. Cardiac catheterization is used to diagnose and/or treat various heart conditions. Doctors may recommend this procedure for a number of different reasons. The most common reason is to evaluate chest pain. Chest pain can be a symptom of coronary artery disease (CAD), and cardiac catheterization can show whether plaque is narrowing or blocking your heart's arteries. This procedure is also used to evaluate the valves, as well as measure the blood flow and oxygen levels in different parts of your heart. For further information please visit HugeFiesta.tn. Please follow instruction sheet, as given.  A chest x-ray takes a picture of the organs and structures inside the chest, including the heart, lungs, and blood vessels. This test can show several things, including, whether the heart is enlarges; whether fluid is building up in the lungs; and whether pacemaker / defibrillator leads are still in place.   Follow-Up: 2 WEEKS DR. HOCHREIN POST CATH   Any Other Special Instructions Will Be Listed Below (If Applicable).  If you need a refill on your cardiac medications before your next appointment, please call your pharmacy.

## 2015-07-11 NOTE — Telephone Encounter (Signed)
Pt has been notified of CXR results today by phone with verbal understanding.

## 2015-07-12 ENCOUNTER — Telehealth: Payer: Self-pay | Admitting: *Deleted

## 2015-07-12 LAB — PROTIME-INR
INR: 0.99 (ref <1.50)
Prothrombin Time: 13.2 seconds (ref 11.6–15.2)

## 2015-07-12 NOTE — Telephone Encounter (Signed)
Pt has been notified of lab results and ok for cath 1/20. Pt said thank you

## 2015-07-14 ENCOUNTER — Ambulatory Visit (HOSPITAL_COMMUNITY)
Admission: RE | Admit: 2015-07-14 | Discharge: 2015-07-14 | Disposition: A | Discharge status: Home / Self Care | Payer: BLUE CROSS/BLUE SHIELD | Source: Ambulatory Visit | Attending: Cardiovascular Disease | Admitting: Cardiovascular Disease

## 2015-07-14 ENCOUNTER — Encounter (HOSPITAL_COMMUNITY)
Admission: RE | Disposition: A | Discharge status: Home / Self Care | Payer: Self-pay | Source: Ambulatory Visit | Attending: Cardiovascular Disease

## 2015-07-14 DIAGNOSIS — I2582 Chronic total occlusion of coronary artery: Secondary | ICD-10-CM | POA: Insufficient documentation

## 2015-07-14 DIAGNOSIS — I9789 Other postprocedural complications and disorders of the circulatory system, not elsewhere classified: Secondary | ICD-10-CM | POA: Diagnosis not present

## 2015-07-14 DIAGNOSIS — Z87891 Personal history of nicotine dependence: Secondary | ICD-10-CM | POA: Diagnosis not present

## 2015-07-14 DIAGNOSIS — B1920 Unspecified viral hepatitis C without hepatic coma: Secondary | ICD-10-CM | POA: Insufficient documentation

## 2015-07-14 DIAGNOSIS — K219 Gastro-esophageal reflux disease without esophagitis: Secondary | ICD-10-CM | POA: Insufficient documentation

## 2015-07-14 DIAGNOSIS — I25119 Atherosclerotic heart disease of native coronary artery with unspecified angina pectoris: Secondary | ICD-10-CM

## 2015-07-14 DIAGNOSIS — I25710 Atherosclerosis of autologous vein coronary artery bypass graft(s) with unstable angina pectoris: Secondary | ICD-10-CM | POA: Insufficient documentation

## 2015-07-14 DIAGNOSIS — I10 Essential (primary) hypertension: Secondary | ICD-10-CM | POA: Diagnosis not present

## 2015-07-14 DIAGNOSIS — E785 Hyperlipidemia, unspecified: Secondary | ICD-10-CM | POA: Diagnosis not present

## 2015-07-14 DIAGNOSIS — I25110 Atherosclerotic heart disease of native coronary artery with unstable angina pectoris: Secondary | ICD-10-CM

## 2015-07-14 DIAGNOSIS — M5416 Radiculopathy, lumbar region: Secondary | ICD-10-CM | POA: Diagnosis not present

## 2015-07-14 DIAGNOSIS — I200 Unstable angina: Secondary | ICD-10-CM

## 2015-07-14 DIAGNOSIS — J45909 Unspecified asthma, uncomplicated: Secondary | ICD-10-CM | POA: Insufficient documentation

## 2015-07-14 DIAGNOSIS — F1010 Alcohol abuse, uncomplicated: Secondary | ICD-10-CM | POA: Insufficient documentation

## 2015-07-14 DIAGNOSIS — Z7982 Long term (current) use of aspirin: Secondary | ICD-10-CM | POA: Diagnosis not present

## 2015-07-14 HISTORY — PX: CARDIAC CATHETERIZATION: SHX172

## 2015-07-14 SURGERY — LEFT HEART CATH AND CORONARY ANGIOGRAPHY

## 2015-07-14 MED ORDER — SODIUM CHLORIDE 0.9 % IJ SOLN: 3.0000 mL | Freq: Two times a day (BID) | INTRAMUSCULAR | Status: DC

## 2015-07-14 MED ORDER — MIDAZOLAM HCL 2 MG/2ML IJ SOLN
INTRAMUSCULAR | Status: DC | PRN
  Administered 2015-07-14: 2 mg via INTRAVENOUS

## 2015-07-14 MED ORDER — HEPARIN (PORCINE) IN NACL 2-0.9 UNIT/ML-% IJ SOLN
INTRAMUSCULAR | Status: DC | PRN
  Administered 2015-07-14: 10:00:00

## 2015-07-14 MED ORDER — SODIUM CHLORIDE 0.9 % IJ SOLN: 3.0000 mL | INTRAMUSCULAR | Status: DC | PRN

## 2015-07-14 MED ORDER — SODIUM CHLORIDE 0.9 % IV SOLN: INTRAVENOUS | Status: AC

## 2015-07-14 MED ORDER — CLOPIDOGREL BISULFATE 300 MG PO TABS
ORAL_TABLET | ORAL | Status: DC | PRN
  Administered 2015-07-14: 600 mg via ORAL

## 2015-07-14 MED ORDER — HEPARIN SODIUM (PORCINE) 1000 UNIT/ML IJ SOLN
INTRAMUSCULAR | Status: DC | PRN
  Administered 2015-07-14: 4000 [IU] via INTRAVENOUS

## 2015-07-14 MED ORDER — HEPARIN SODIUM (PORCINE) 1000 UNIT/ML IJ SOLN
INTRAMUSCULAR | Status: AC
  Filled 2015-07-14: qty 1

## 2015-07-14 MED ORDER — SODIUM CHLORIDE 0.9 % IV SOLN: 250.0000 mL | INTRAVENOUS | Status: DC | PRN

## 2015-07-14 MED ORDER — IOHEXOL 350 MG/ML SOLN
INTRAVENOUS | Status: DC | PRN
  Administered 2015-07-14: 160 mL via INTRA_ARTERIAL

## 2015-07-14 MED ORDER — ASPIRIN 81 MG PO CHEW: 81.0000 mg | CHEWABLE_TABLET | ORAL | Status: DC

## 2015-07-14 MED ORDER — VERAPAMIL HCL 2.5 MG/ML IV SOLN
INTRAVENOUS | Status: AC
  Filled 2015-07-14: qty 2

## 2015-07-14 MED ORDER — HEPARIN (PORCINE) IN NACL 2-0.9 UNIT/ML-% IJ SOLN
INTRAMUSCULAR | Status: AC
  Filled 2015-07-14: qty 1500

## 2015-07-14 MED ORDER — LIDOCAINE HCL (PF) 1 % IJ SOLN
INTRAMUSCULAR | Status: AC
  Filled 2015-07-14: qty 30

## 2015-07-14 MED ORDER — CLOPIDOGREL BISULFATE 300 MG PO TABS
ORAL_TABLET | ORAL | Status: AC
  Filled 2015-07-14: qty 2

## 2015-07-14 MED ORDER — SODIUM CHLORIDE 0.9 % IV SOLN
INTRAVENOUS | Status: DC
  Administered 2015-07-14: 08:00:00 via INTRAVENOUS

## 2015-07-14 MED ORDER — FENTANYL CITRATE (PF) 100 MCG/2ML IJ SOLN
INTRAMUSCULAR | Status: DC | PRN
  Administered 2015-07-14: 25 ug via INTRAVENOUS

## 2015-07-14 MED ORDER — ALPRAZOLAM 0.5 MG PO TABS: 0.5000 mg | ORAL_TABLET | Freq: Once | ORAL | Status: DC

## 2015-07-14 MED ORDER — FENTANYL CITRATE (PF) 100 MCG/2ML IJ SOLN
INTRAMUSCULAR | Status: AC
  Filled 2015-07-14: qty 2

## 2015-07-14 MED ORDER — LIDOCAINE HCL (PF) 1 % IJ SOLN
INTRAMUSCULAR | Status: DC | PRN
  Administered 2015-07-14: 5 mL

## 2015-07-14 MED ORDER — MIDAZOLAM HCL 2 MG/2ML IJ SOLN
INTRAMUSCULAR | Status: AC
  Filled 2015-07-14: qty 2

## 2015-07-14 MED ORDER — VERAPAMIL HCL 2.5 MG/ML IV SOLN
INTRAVENOUS | Status: DC | PRN
  Administered 2015-07-14: 10 mL via INTRA_ARTERIAL

## 2015-07-14 SURGICAL SUPPLY — 12 items
CATH INFINITI 5 FR IM (CATHETERS) ×3 IMPLANT
CATH INFINITI 5 FR JL3.5 (CATHETERS) ×3 IMPLANT
CATH INFINITI 5 FR LCB (CATHETERS) ×3 IMPLANT
CATH INFINITI 5FR MULTPACK ANG (CATHETERS) ×3 IMPLANT
DEVICE RAD COMP TR BAND LRG (VASCULAR PRODUCTS) ×3 IMPLANT
GLIDESHEATH SLEND SS 6F .021 (SHEATH) ×3 IMPLANT
KIT HEART LEFT (KITS) ×3 IMPLANT
PACK CARDIAC CATHETERIZATION (CUSTOM PROCEDURE TRAY) ×3 IMPLANT
SYR MEDRAD MARK V 150ML (SYRINGE) ×3 IMPLANT
TRANSDUCER W/STOPCOCK (MISCELLANEOUS) ×3 IMPLANT
TUBING CIL FLEX 10 FLL-RA (TUBING) ×3 IMPLANT
WIRE SAFE-T 1.5MM-J .035X260CM (WIRE) ×3 IMPLANT

## 2015-07-14 NOTE — Discharge Instructions (Signed)
Radial Site Care Refer to this sheet in the next few weeks. These instructions provide you with information about caring for yourself after your procedure. Your health care provider may also give you more specific instructions. Your treatment has been planned according to current medical practices, but problems sometimes occur. Call your health care provider if you have any problems or questions after your procedure. WHAT TO EXPECT AFTER THE PROCEDURE After your procedure, it is typical to have the following:  Bruising at the radial site that usually fades within 1-2 weeks.  Blood collecting in the tissue (hematoma) that may be painful to the touch. It should usually decrease in size and tenderness within 1-2 weeks. HOME CARE INSTRUCTIONS  Take medicines only as directed by your health care provider.  You may shower 24-48 hours after the procedure or as directed by your health care provider. Remove the bandage (dressing) and gently wash the site with plain soap and water. Pat the area dry with a clean towel. Do not rub the site, because this may cause bleeding.  Do not take baths, swim, or use a hot tub until your health care provider approves.  Check your insertion site every day for redness, swelling, or drainage.  Do not apply powder or lotion to the site.  Do not flex or bend the affected arm for 24 hours or as directed by your health care provider.  Do not push or pull heavy objects with the affected arm for 24 hours or as directed by your health care provider.  Do not lift over 10 lb (4.5 kg) for 5 days after your procedure or as directed by your health care provider.  Ask your health care provider when it is okay to:  Return to work or school.  Resume usual physical activities or sports.  Resume sexual activity.  Do not drive home if you are discharged the same day as the procedure. Have someone else drive you.  You may drive 24 hours after the procedure unless otherwise  instructed by your health care provider.  Do not operate machinery or power tools for 24 hours after the procedure.  If your procedure was done as an outpatient procedure, which means that you went home the same day as your procedure, a responsible adult should be with you for the first 24 hours after you arrive home.  Keep all follow-up visits as directed by your health care provider. This is important. SEEK MEDICAL CARE IF:  You have a fever.  You have chills.  You have increased bleeding from the radial site. Hold pressure on the site. SEEK IMMEDIATE MEDICAL CARE IF:  You have unusual pain at the radial site.  You have redness, warmth, or swelling at the radial site.  You have drainage (other than a small amount of blood on the dressing) from the radial site.  The radial site is bleeding, and the bleeding does not stop after 30 minutes of holding steady pressure on the site and call 911.  Your arm or hand becomes pale, cool, tingly, or numb.   This information is not intended to replace advice given to you by your health care provider. Make sure you discuss any questions you have with your health care provider.   Document Released: 07/13/2010 Document Revised: 07/01/2014 Document Reviewed: 12/27/2013 Elsevier Interactive Patient Education [employer].               Return to Work Manuel Saxon Owens__ was treated at our facility. INJURY OR  ILLNESS WAS: _____ Work related __X___ Not work related _____ Undetermined if work related RETURN TO WORK  Employee may return to work on _Monday Jan. 30, 2017.  Employee may return to modified work on ____________________. WORK ACTIVITY RESTRICTIONS Work activities that are not tolerated include: _____ Bending _____ Prolonged sitting _____ Lifting more than ____________________ lb _____ Squatting _____ Prolonged standing _____ Climbing _____ Reaching _____ Pushing and pulling _____ Walking _____ Other  ____________________ These restrictions are effective until ____________________. Show this Return to Work statement to your supervisor at work as soon as possible. Your employer should be aware of your condition and can help with the necessary work activity restrictions. If you wish to return to work sooner than the date that is listed above, or if you have further problems that make it difficult for you to return at that time, please call our clinic or your health care provider. _Dr. Harrell Gave McAlhany_____ Health Care Provider Name (printed) _________________________________________ Health Care Provider (signature)  _01/20/2017_______ Date   This information is not intended to replace advice given to you by your health care provider. Make sure you discuss any questions you have with your health care provider.   Document Released: 06/10/2005 Document Revised: 07/01/2014 Document Reviewed: 01/07/2014 Elsevier Interactive Patient Education [employer].

## 2015-07-14 NOTE — H&P (View-Only) (Signed)
Cardiology Office Note    Date:  07/11/2015   ID:  Manuel Bell, DOB June 01, 1956, MRN [id number]  PCP:  No PCP Per Patient  Cardiologist:  Dr. Minus Breeding   Electrophysiologist:  n/a  Chief Complaint  Patient presents with  . Shortness of Breath    History of Present Illness:  Manuel Bell is a 60 y.o. male with a hx of CAD s/p CABG 2013, HTN, HL, alcohol use, HCV (resolved after treatment), elevated LFTs, EF 45-50% at time of CABG.  Last seen by Dr. Minus Breeding 05/29/15.  He presents today with complaints of exertional dyspnea and chest pain over the past 1 month.  He can bring on symptoms with mild to moderate activity.  He denies rest symptoms. He notes his symptoms are somewhat similar to his prior angina but not as bad.  He had a flu like illness several weeks ago, but this has resolved.  He denies orthopnea, PND, edema.  Denies syncope. He has not taken NTG for his symptoms.     Past Medical History  Diagnosis Date  . Hepatitis C     s/p interferon/[facility]; chronically elevated LFTs  . Hyperlipidemia   . CAD (coronary artery disease)     LHC 09/11/11: 3 vessel CAD, EF 45-50%  . Hx of CABG 08/2011    Dr. Prescott Gum - LIMA to LAD, SVG-D1, SVG-distal RCA, SVG-RI/distal circumflex  . Postoperative atrial fibrillation ([place]) 08/2011    Amiodarone  . Alcohol abuse   . Complication of anesthesia     slow to awaken  . PONV (postoperative nausea and vomiting)   . Family history of anesthesia complication   . Dysrhythmia     palpations at times  . Asthma     as a child   . GERD (gastroesophageal reflux disease)   . Coronary atherosclerosis of native coronary artery 10/02/2011  . HYPERLIPIDEMIA 11/22/2009    Qualifier: Diagnosis of  By: Linna Darner MD, Gwyndolyn Saxon   Pravastatin 40 mg started post CBAG 08/2011      . HYPERTENSION, ESSENTIAL NOS 08/20/2007    Qualifier: Diagnosis of  By: Linna Darner MD, Monia Pouch, VIRAL HEPATITIS C 01/28/2007    Qualifier: Diagnosis of  By:  Linna Darner MD, Carmell Austria, TRANSAMINASE/LDH LEVELS 02/06/2007    Qualifier: Diagnosis of  By: Janelle Floor    . LUMBAR RADICULOPATHY, RIGHT 01/02/2010    Qualifier: Diagnosis of  By: Linna Darner MD, Gwyndolyn Saxon    . Other abnormal glucose 01/07/2012    Fasting blood sugar ranged 103-116 hospitalized March/April 2013.   . S/P CABG x 5 09/24/2011    09/12/2011   . NEURALGIA 11/22/2009    Qualifier: Diagnosis of  By: Linna Darner MD, Gwyndolyn Saxon    . GERD 10/19/2008    Qualifier: Diagnosis of  By: Linna Darner MD, Gwyndolyn Saxon      Past Surgical History  Procedure Laterality Date  . Orchiectomy      left for scar tissue post torsion; Dr Hartley Barefoot  . Tympanic membranes      perforation X 3 of  L  TM  ;surgically repaired  . Arthroscopic knee surgery      left  . Coronary artery bypass graft  09/12/2011    Procedure: CORONARY ARTERY BYPASS GRAFTING (CABG);  Surgeon: Ivin Poot, MD;  Location: [facility];  Service: Open Heart Surgery;  Laterality: N/A;  . Colonoscopy  2005    negative; [facility]  .  Esophagogastroduodenoscopy (egd) with propofol N/A 02/16/2014    Procedure: ESOPHAGOGASTRODUODENOSCOPY (EGD) WITH PROPOFOL;  Surgeon: Winfield Cunas., MD;  Location: [facility] ENDOSCOPY;  Service: Endoscopy;  Laterality: N/A;  H&P in file    Current Medications: Outpatient Prescriptions Prior to Visit  Medication Sig Dispense Refill  . acyclovir ointment (ZOVIRAX) 5 % Apply 1 application topically as needed (for fever blisters).    Marland Kitchen aspirin 81 MG tablet Take 81 mg by mouth daily.    . hydrochlorothiazide (HYDRODIURIL) 25 MG tablet Take 1 tablet (25 mg total) by mouth daily. 30 tablet 11  . lansoprazole (PREVACID) 30 MG capsule Take 1 capsule (30 mg total) by mouth daily. 90 capsule 1  . lisinopril (PRINIVIL,ZESTRIL) 20 MG tablet TAKE 1 TABLET BY MOUTH TWICE DAILY 60 tablet 3  . metoprolol (LOPRESSOR) 50 MG tablet TAKE 1 TABLET BY MOUTH TWICE A DAY 180 tablet 1  . Omega-3 Fatty Acids (FISH OIL) 1200 MG CAPS Take 1,200  mg by mouth 2 (two) times daily.     . pravastatin (PRAVACHOL) 40 MG tablet Take 1 tablet (40 mg total) by mouth every evening. 30 tablet 6  . zolpidem (AMBIEN) 10 MG tablet TAKE 1 TABLET BY MOUTH EVERY 3RD NIGHT *ONLY AS NEEDED * 30 tablet 0  . nitroGLYCERIN (NITROSTAT) 0.4 MG SL tablet Place 1 tablet (0.4 mg total) under the tongue every 5 (five) minutes as needed for chest pain. 25 tablet 2   No facility-administered medications prior to visit.      Allergies:   Sulfa antibiotics   Social History   Social History  . Marital Status: Single    Spouse Name: N/A  . Number of Children: N/A  . Years of Education: N/A   Social History Main Topics  . Smoking status: Former Smoker -- 1.00 packs/day for 30 years    Types: Cigarettes    Quit date: 09/10/2003  . Smokeless tobacco: None     Comment: smoked 1971- 2005 (off intermittently 1.5 years), up to 1 ppd   . Alcohol Use: 8.4 oz/week    14 Cans of beer per week     Comment: none since 4/20 /2015  . Drug Use: Yes    Special: Marijuana     Comment: last time 01/2014- early  in August  . Sexual Activity: Not Asked   Other Topics Concern  . None   Social History Narrative   Lives alone.     Family History:  The patient's family history includes Heart attack (age of onset: 55) in his maternal grandfather; Hyperlipidemia in his sister; Lung cancer (age of onset: 60) in his mother. There is no history of Diabetes or Stroke.   ROS:   Please see the history of present illness.    Review of Systems  Constitution: Positive for chills and decreased appetite.  Cardiovascular: Positive for chest pain and dyspnea on exertion.  Musculoskeletal: Positive for joint pain.  Psychiatric/Behavioral: The patient is nervous/anxious.   All other systems reviewed and are negative.   PHYSICAL EXAM:   VS:  BP 115/82 mmHg  Pulse 72  Ht 5\' 9"  (1.753 m)  Wt 185 lb 6.4 oz (84.097 kg)  BMI 27.37 kg/m2   GEN: Well nourished, well developed, in no  acute distress HEENT: normal Neck: no JVD, no masses Cardiac: Normal S1/S2, RRR; no murmurs, rubs, or gallops, no edema;  no carotid bruits,   Respiratory:  clear to auscultation bilaterally; no wheezing, rhonchi or rales GI: soft, nontender,  nondistended, + BS MS: no deformity or atrophy Skin: warm and dry, no rash Neuro: no focal deficits  Psych: Alert and oriented x 3, normal affect  Wt Readings from Last 3 Encounters:  07/11/15 185 lb 6.4 oz (84.097 kg)  05/29/15 186 lb 6.4 oz (84.55 kg)  06/03/14 174 lb (78.926 kg)      Studies/Labs Reviewed:   EKG:  EKG is  ordered today.  The ekg ordered today demonstrates NSR, HR 69, normal axis, QTc 437 ms, no significant change when compared to prior tracings.   Recent Labs: No results found for requested labs within last 365 days.   Recent Lipid Panel    Component Value Date/Time   CHOL 158 06/03/2014 0841   TRIG 66 06/03/2014 0841   HDL 51 06/03/2014 0841   CHOLHDL 3.1 06/03/2014 0841   VLDL 13 06/03/2014 0841   LDLCALC 94 06/03/2014 0841   LDLDIRECT 146.5 11/29/2009 0839    Additional studies/ records that were reviewed today include:   Carotid US 3/13 No evidence of significant ICA stenosis bilaterally.  LHC 3/13 Left main: No obstructive disease.  Left Anterior Descending Artery: Large caliber vessel that courses to the apex. The proximal and mid vessel is heavily calcified. The proximal vessel has a 99% stenosis. The mid vessel is diffusely disease with discreet 80% stenosis. The first diagonal branch has 100% proximal occlusion and is heavily calcified. This appears to be a moderate sized vessel. The second diagonal is a smaller caliber vessel with plaque disease. The distal LAD has luminal irregularities.  Circumflex Artery: Ramus intermediate branch with 70% proximal stenosis, moderate sized vessel. The mid Circumflex has a 90% stenosis at the takeoff of a small caliber marginal branch.  Right Coronary Artery:  Large, dominant vessel with 50% proximal stenosis, 70% mid stenosis. There is a 40% stenosis at the takeoff of the PDA.  Left Ventricular Angiogram: Hypokinesis of the anterior wall and apex. LVEF= 45-50%.  Impression: 1. Triple vessel CAD 2. Segmental LV systolic dysfunction 3. Unstable angina (chest pain on table with injections) Recommendations: We will admit the pt to telemetry. He has multi-vessel CAD with overall preserved LV function. I will review the films with Dr. Percival Spanish and will make further plans for revascularization. I think surgical revascularization is the best option given his severe LAD disease with heavy calcification, occluded Diagonal and moderate to severe disease in the other vessels.    ASSESSMENT:    1. Coronary artery disease involving native coronary artery of native heart with angina pectoris ([place])   2. Essential hypertension   3. Hyperlipidemia   4. History of tobacco abuse   5. Shortness of breath     PLAN:  In order of problems listed above:  1. CAD - He presents with chest symptoms and dyspnea with exertion similar to his prior angina. He has symptoms c/w CCS Class 2-3 angina. He has not tried NTG. His ECG does not have acute changes.  We discussed whether to proceed with stress testing vs cardia catheterization. Given his symptoms, I have recommended proceeding with cardiac cath.  I reviewed this with Dr. Cristopher Peru who also saw the patient and agreed.  Risks and benefits of cardiac catheterization have been discussed with the patient.  These include bleeding, infection, kidney damage, stroke, heart attack, death.  The patient understands these risks and is willing to proceed.   -  Arrange LHC in the next 1-2 weeks  -  Continue ASA, statin, beta-blocker.   -  FU with Dr. Minus Breeding 2 weeks post cath.   2. HTN - Controlled.  He notes leg cramps and takes HCTZ.  Will get BMET with pre-cath labs today to check K+.    3. HL - Continue statin.    4.  Hx of tobacco abuse - With his DOE, he is concerned about lung cancer.  It has been several years since he has had a CXR.  Will get a CXR as well.    Medication Adjustments/Labs and Tests Ordered: Current medicines are reviewed at length with the patient today.  Concerns regarding medicines are outlined above.  Medication changes, Labs and Tests ordered today are outlined in the Patient Instructions noted below. Patient Instructions  Medication Instructions:  Your physician recommends that you continue on your current medications as directed. Please refer to the Current Medication list given to you today.   Labwork: TODAY BMET, CBC W/DIFF, PT/INR  Testing/Procedures: Your physician has requested that you have a cardiac catheterization. Cardiac catheterization is used to diagnose and/or treat various heart conditions. Doctors may recommend this procedure for a number of different reasons. The most common reason is to evaluate chest pain. Chest pain can be a symptom of coronary artery disease (CAD), and cardiac catheterization can show whether plaque is narrowing or blocking your heart's arteries. This procedure is also used to evaluate the valves, as well as measure the blood flow and oxygen levels in different parts of your heart. For further information please visit [email or web address]. Please follow instruction sheet, as given.  A chest x-ray takes a picture of the organs and structures inside the chest, including the heart, lungs, and blood vessels. This test can show several things, including, whether the heart is enlarges; whether fluid is building up in the lungs; and whether pacemaker / defibrillator leads are still in place.   Follow-Up: 2 WEEKS DR. HOCHREIN POST CATH   Any Other Special Instructions Will Be Listed Below (If Applicable).  If you need a refill on your cardiac medications before your next appointment, please call your pharmacy.     Signed, Richardson Dopp, PA-C    07/11/2015 10:27 AM    [facility] [address], [place], [place]  [address] Phone: [phone]; Fax: [phone]    Cardiology Attending  Patient seen and examined. Agree with the findings as noted above. The represent my own. His exam reveals a RRR and his lungs are clear and his extremities demonstrate no edema.   A/P 1.  Crescendo angina - Will admit for cardiac catheterization as he has developed crescendo angina in the setting of known CAD, s/p CABG 3 years ago. He is currently pain free with a non-acute ECG 2. Dyslipidemia - he will continue his current meds.  3. HTN - his blood pressure has been well controlled. He will continue his current meds.   Mikle [place].D.

## 2015-07-14 NOTE — Interval H&P Note (Signed)
History and Physical Interval Note:  07/14/2015 9:13 AM  Manuel Bell  has presented today for cardiac cath with the diagnosis of unstable angina, prior CABG.   The various methods of treatment have been discussed with the patient and family. After consideration of risks, benefits and other options for treatment, the patient has consented to  Procedure(s): Left Heart Cath and Coronary Angiography (N/A) as a surgical intervention .  The patient's history has been reviewed, patient examined, no change in status, stable for surgery.  I have reviewed the patient's chart and labs.  Questions were answered to the patient's satisfaction.    Cath Lab Visit (complete for each Cath Lab visit)  Clinical Evaluation Leading to the Procedure:   ACS: No.  Non-ACS:    Anginal Classification: CCS III  Anti-ischemic medical therapy: Minimal Therapy (1 class of medications)  Non-Invasive Test Results: No non-invasive testing performed  Prior CABG: Previous CABG         MCALHANY,CHRISTOPHER

## 2015-07-17 ENCOUNTER — Telehealth: Payer: Self-pay | Admitting: Cardiovascular Disease

## 2015-07-17 ENCOUNTER — Encounter (HOSPITAL_COMMUNITY): Payer: Self-pay | Admitting: Cardiovascular Disease

## 2015-07-17 DIAGNOSIS — I25709 Atherosclerosis of coronary artery bypass graft(s), unspecified, with unspecified angina pectoris: Secondary | ICD-10-CM

## 2015-07-17 NOTE — Telephone Encounter (Signed)
Spoke with pt. He is asking about next step. He is not having any complications from cath.  Chart reviewed and Dr. Angelena Form to review with colleagues. I told pt I would forward note to Dr. Angelena Form and Dr. Percival Spanish for recommendations.

## 2015-07-17 NOTE — Telephone Encounter (Signed)
Order placed for referral to Dr Prescott Gum to discuss redo surgery.

## 2015-07-17 NOTE — Telephone Encounter (Signed)
NEw Message  Pt calling to speak w/ RN- concerning what to do going forward from complications during cath 07/14/15. Please call back and discuss.

## 2015-07-17 NOTE — Telephone Encounter (Signed)
I spoke with the patient.  I spoke with Dr. Angelena Form.  The patient will be called to see Dr. Prescott Gum in the office.  The patient needs to be considered for redo surgery.

## 2015-07-19 ENCOUNTER — Other Ambulatory Visit: Payer: Self-pay | Admitting: Cardiology

## 2015-07-19 NOTE — Telephone Encounter (Signed)
Rx request sent to pharmacy.  

## 2015-07-20 ENCOUNTER — Ambulatory Visit: Payer: BLUE CROSS/BLUE SHIELD | Admitting: Cardiothoracic Surgery

## 2015-07-24 ENCOUNTER — Other Ambulatory Visit: Payer: Self-pay | Admitting: *Deleted

## 2015-07-24 ENCOUNTER — Other Ambulatory Visit: Payer: Self-pay | Admitting: Cardiothoracic Surgery

## 2015-07-24 ENCOUNTER — Encounter: Payer: Self-pay | Admitting: Cardiothoracic Surgery

## 2015-07-24 ENCOUNTER — Ambulatory Visit (INDEPENDENT_AMBULATORY_CARE_PROVIDER_SITE_OTHER): Payer: BLUE CROSS/BLUE SHIELD | Admitting: Cardiothoracic Surgery

## 2015-07-24 VITALS — BP 146/89 | HR 68 | Resp 20 | Ht 69.0 in | Wt 185.0 lb

## 2015-07-24 DIAGNOSIS — I25119 Atherosclerotic heart disease of native coronary artery with unspecified angina pectoris: Secondary | ICD-10-CM

## 2015-07-24 DIAGNOSIS — Z736 Limitation of activities due to disability: Secondary | ICD-10-CM

## 2015-07-24 DIAGNOSIS — I2510 Atherosclerotic heart disease of native coronary artery without angina pectoris: Secondary | ICD-10-CM | POA: Diagnosis not present

## 2015-07-24 DIAGNOSIS — Z951 Presence of aortocoronary bypass graft: Secondary | ICD-10-CM | POA: Diagnosis not present

## 2015-07-24 NOTE — H&P (Signed)
PCP is No PCP Per Patient Referring Provider is Burnell Blanks*  Chief Complaint  Patient presents with  . Manuel Bell    Surgical eval, cardiac cath 07/14/15, HX of CABG 09/12/11   patient examined, most recent Manuel angiogram images personally reviewed and discussed with Dr. Kendra Opitz.    History of present illness  The patient is a very nice 60 year old Caucasian male with family history of CAD, hypertension, hyperlipidemia as well as past history of hepatitis C, cirrhosis, and portal hypertension.also past history of alcohol and tobacco abuse.4 years ago he had CABG x5 for presentation with unstable angina.left IMA to LAD, vein graft to diagonal, sequential vein graft to ramus intermediate and circumflex marginal and vein graft to distal RCA posterior lateral. He did well until 2 months ago when he developed recurrent symptoms of exertional angina. Cardiac catheterization was performed. This demonstrated patent left IMA to LAD, patent vein graft to diagonal, occluded saphenous vein graft to a small posterior lateral but otherwise normal right Manuel and included sequential vein graft to the circumflex system. It appears that the culprit vessels are in the circumflex system and he would benefit from redo CABG. His cardiologist does not feel that percutaneous be safe due to the heavily calcified Manuel vesselsand high-grade left main stenosis The patient was placed on Plavix at the time of the catheterization in case he was turned down for redo CABG. He still taking Plavix. He is not have any rest pain. He denies symptoms of CHF. His ejection fraction is fairly well preserved and is LVEDP was normal.  It appears the patient would benefit from redo CABG with left radial artery graft to the large OM1 and a saphenous vein graft to the distal circumflex. The right Manuel does  not need a graft.surgery will be scheduled in a week to allow Plavix washout.  The patient currently  does not smoke. He takes pravastatin with fairly well controlled lipid panel. He is not overweight and he tries to exercise radially although this has tailed off the past couple of years.    Past Medical History  Diagnosis Date  . Hepatitis C     s/p interferon/[facility] Rx; chronically elevated LFTs  . Hyperlipidemia   . CAD (Manuel Bell)     LHC 09/11/11: 3 vessel CAD, EF 45-50%  . Hx of CABG 08/2011    Dr. Prescott Gum - LIMA to LAD, SVG-D1, SVG-distal RCA, SVG-RI/distal circumflex  . Postoperative atrial fibrillation ([place]) 08/2011    Amiodarone  . Alcohol abuse   . Complication of anesthesia     slow to awaken  . PONV (postoperative nausea and vomiting)   . Family history of anesthesia complication   . Dysrhythmia     palpations at times  . Asthma     as a child   . GERD (gastroesophageal reflux Bell)   . Manuel atherosclerosis of native Manuel artery 10/02/2011  . HYPERLIPIDEMIA 11/22/2009    Qualifier: Diagnosis of  By: Linna Darner MD, Gwyndolyn Saxon   Pravastatin 40 mg started post CBAG 08/2011      . HYPERTENSION, ESSENTIAL NOS 08/20/2007    Qualifier: Diagnosis of  By: Linna Darner MD, Monia Pouch, VIRAL HEPATITIS C 01/28/2007    Qualifier: Diagnosis of  By: Linna Darner MD, Carmell Austria, TRANSAMINASE/LDH LEVELS 02/06/2007    Qualifier: Diagnosis of  By: Janelle Floor    . LUMBAR RADICULOPATHY, RIGHT 01/02/2010    Qualifier: Diagnosis of  By: Linna Darner MD, Miklo    . Other abnormal glucose 01/07/2012    Fasting blood sugar ranged 103-116 hospitalized March/April 2013.   . S/P CABG x 5 09/24/2011    09/12/2011   . NEURALGIA 11/22/2009    Qualifier: Diagnosis of  By: Linna Darner MD, Gwyndolyn Saxon    . GERD 10/19/2008    Qualifier: Diagnosis of  By: Linna Darner MD, Gwyndolyn Saxon      Past Surgical History  Procedure Laterality Date  . Orchiectomy      left for scar tissue post torsion; Dr Hartley Barefoot  . Tympanic membranes      perforation X 3 of  L  TM  ;surgically repaired  . Arthroscopic  knee surgery      left  . Manuel artery bypass graft  09/12/2011    Procedure: Manuel ARTERY BYPASS GRAFTING (CABG);  Surgeon: Ivin Poot, MD;  Location: [facility];  Service: Open Heart Surgery;  Laterality: N/A;  . Colonoscopy  2005    negative; [facility]  . Esophagogastroduodenoscopy (egd) with propofol N/A 02/16/2014    Procedure: ESOPHAGOGASTRODUODENOSCOPY (EGD) WITH PROPOFOL;  Surgeon: Winfield Cunas., MD;  Location: [facility] ENDOSCOPY;  Service: Endoscopy;  Laterality: N/A;  H&P in file  . Cardiac catheterization N/A 07/14/2015    Procedure: Left Heart Cath and Manuel Angiography;  Surgeon: Burnell Blanks, MD;  Location: [facility];  Service: Cardiovascular;  Laterality: N/A;    Family History  Problem Relation Age of Onset  . Lung cancer Mother 75  . Heart attack Maternal Grandfather 54  . Hyperlipidemia Sister   . Diabetes Neg Hx   . Stroke Neg Hx     Social History Social History  Substance Use Topics  . Smoking status: Former Smoker -- 1.00 packs/day for 30 years    Types: Cigarettes    Quit date: 09/10/2003  . Smokeless tobacco: None     Comment: smoked 1971- 2005 (off intermittently 1.5 years), up to 1 ppd   . Alcohol Use: 8.4 oz/week    14 Cans of beer per week     Comment: none since 4/20 /2015    Current Outpatient Prescriptions  Medication Sig Dispense Refill  . acyclovir ointment (ZOVIRAX) 5 % Apply 1 application topically as needed (for fever blisters).    Marland Kitchen aspirin 81 MG tablet Take 81 mg by mouth daily.    . clopidogrel (PLAVIX) 75 MG tablet Take 75 mg by mouth daily.    . hydrochlorothiazide (HYDRODIURIL) 25 MG tablet Take 1 tablet (25 mg total) by mouth daily. 30 tablet 11  . isosorbide mononitrate (IMDUR) 30 MG 24 hr tablet Take 30 mg by mouth daily.    . lansoprazole (PREVACID) 30 MG capsule Take 1 capsule (30 mg total) by mouth daily. 90 capsule 1  . lisinopril (PRINIVIL,ZESTRIL) 20 MG tablet TAKE 1 TABLET BY MOUTH TWICE A DAY 60  tablet 1  . metoprolol (LOPRESSOR) 50 MG tablet TAKE 1 TABLET BY MOUTH TWICE A DAY 180 tablet 1  . nitroGLYCERIN (NITROSTAT) 0.4 MG SL tablet Place 0.4 mg under the tongue every 5 (five) minutes as needed for chest pain (x 3 doses).    . Omega-3 Fatty Acids (FISH OIL) 1200 MG CAPS Take 1,200 mg by mouth 2 (two) times daily.     . pravastatin (PRAVACHOL) 40 MG tablet Take 1 tablet (40 mg total) by mouth every evening. 30 tablet 6  . zolpidem (AMBIEN) 10 MG tablet TAKE 1 TABLET BY MOUTH EVERY  3RD NIGHT *ONLY AS NEEDED * 30 tablet 0  . [DISCONTINUED] amLODipine (NORVASC) 10 MG tablet Take 1 tablet (10 mg total) by mouth daily. 90 tablet 0   No current facility-administered medications for this visit.    Allergies  Allergen Reactions  . Sulfa Antibiotics Anxiety and Tinitus    Review of Systems:       Review of Systems :  [ y ] = yes, [  ] = no        General :  Weight gain [   ]    Weight loss  [   ]  Fatigue [  ]  Fever [  ]  Chills  [  ]                                Weakness  [  ]           Cardiac :  Chest pain/ pressure [place].Blacker  ]  Resting SOB [  ] exertional SOB [  ]                        Orthopnea [  ]  Pedal edema  [  ]  Palpitations [  ] Syncope/presyncope [ ]                         Paroxysmal nocturnal dyspnea [  ]        Pulmonary : cough [  ]  wheezing [  ]  Hemoptysis [  ] Sputum [  ] Snoring [  ]                              Pneumothorax [  ]  Sleep apnea [  ]       GI : Vomiting [  ]  Dysphagia [  ]  Melena  [  ]  Abdominal pain [  ] BRBPR [  ] medically treated for hepatitis C with Harvoni              Heart burn [  ]  Constipation [  ] Diarrhea  [  ] Colonoscopy [  ]       GU : Hematuria [  ]  Dysuria [  ]  Nocturia [  ] UTI's [  ]       Vascular : Claudication [  ]  Rest pain [  ]  DVT [  ] Vein stripping [  ] leg ulcers [  ] saphenous vein was harvested from his right leg previously. His left leg he has saphenous vein intact. Previously his ABIs showed normal left Palmar  arch circulation                          TIA [  ] Stroke [  ]  Varicose veins [  ]       NEURO :  Headaches  [  ] Seizures [  ] Vision changes [  ] Paresthesias [  ]       Musculoskeletal :  Arthritis [  ] Gout  [  ]  Back pain [  ]  Joint pain [  ]       Skin :  Rash [  ]  Melanoma [  ]  Heme : Bleeding problems [  ]Clotting Disorders [  ] Anemia [  ]Blood Transfusion [ ]        Endocrine : Diabetes [  ] Thyroid Disorder  [  ]       Psych : Depression [  ]  Anxiety [  ]  Psych hospitalizations [  ]      The patient did well after his first CABG 4 years ago with some brief postop atrial fibrillation                                         BP 146/89 mmHg  Pulse 68  Resp 20  Ht 5\' 9"  (1.753 m)  Wt 185 lb (83.915 kg)  BMI 27.31 kg/m2  SpO2 98% Physical Exam:     Physical Exam  General: anxious but well-nourished middle-aged Caucasian male no distress accompanied by family HEENT: Normocephalic pupils equal , dentition adequate Neck: Supple without JVD, adenopathy, or bruit Chest: Clear to auscultation, symmetrical breath sounds, no rhonchi, no tenderness             or deformity. Well-healed sternal incision Cardiovascular: Regular rate and rhythm, no murmur, no gallop, peripheral pulses             palpable in all extremities Abdomen:  Soft, nontender, no palpable mass or organomegaly Extremities: Warm, well-perfused, no clubbing cyanosis edema or tenderness,              no venous stasis changes of the legs Rectal/GU: Deferred Neuro: Grossly non--focal and symmetrical throughout Skin: Clean and dry without rash or ulceration   Diagnostic Tests: Cardiac catheterization images reviewed and counseled with family and patient  We'll plan echocardiogram to assess valvular function and CT scan of chest to assess safety redo sternotomy prior to redo CABG  Impression: Plan redo CABG x2 with radial artery graft to OM1 and saphenous vein graft to distal circumflex scheduled  for February 7. He will stop his Plavix for 7 days.  Plan: preop labs and preadmission to be scheduled for this Friday, February 3. I discussed the procedure of redo CABG with the patient and his family and he understands that this procedure is at increased risk, will take longer but will  be his best chance for long-term therapy of his premature CAD.   Len Childs, MD [facility] [phone]

## 2015-07-25 ENCOUNTER — Ambulatory Visit (HOSPITAL_BASED_OUTPATIENT_CLINIC_OR_DEPARTMENT_OTHER)
Admission: RE | Admit: 2015-07-25 | Discharge: 2015-07-25 | Disposition: A | Discharge status: Home / Self Care | Payer: BLUE CROSS/BLUE SHIELD | Source: Ambulatory Visit | Attending: Cardiothoracic Surgery | Admitting: Cardiothoracic Surgery

## 2015-07-25 DIAGNOSIS — R911 Solitary pulmonary nodule: Secondary | ICD-10-CM | POA: Diagnosis not present

## 2015-07-25 DIAGNOSIS — I25119 Atherosclerotic heart disease of native coronary artery with unspecified angina pectoris: Secondary | ICD-10-CM | POA: Diagnosis not present

## 2015-07-25 DIAGNOSIS — Z951 Presence of aortocoronary bypass graft: Secondary | ICD-10-CM | POA: Insufficient documentation

## 2015-07-25 DIAGNOSIS — I2510 Atherosclerotic heart disease of native coronary artery without angina pectoris: Secondary | ICD-10-CM | POA: Diagnosis present

## 2015-07-25 DIAGNOSIS — R0602 Shortness of breath: Secondary | ICD-10-CM | POA: Diagnosis not present

## 2015-07-25 MED ORDER — IOHEXOL 300 MG/ML  SOLN
75.0000 mL | Freq: Once | INTRAMUSCULAR | Status: AC | PRN
Start: 2015-07-25 — End: 2015-07-25
  Administered 2015-07-25: 75 mL via INTRAVENOUS

## 2015-07-27 ENCOUNTER — Telehealth: Payer: Self-pay | Admitting: Internal Medicine

## 2015-07-27 NOTE — Telephone Encounter (Signed)
Reference#[id number] wants to know if the doctor took him out on 07-17-15 please. Also wants to know if you received disability forms?

## 2015-07-28 ENCOUNTER — Ambulatory Visit: Payer: BLUE CROSS/BLUE SHIELD | Admitting: Physician Assistant

## 2015-07-28 ENCOUNTER — Ambulatory Visit (HOSPITAL_BASED_OUTPATIENT_CLINIC_OR_DEPARTMENT_OTHER)
Admission: RE | Admit: 2015-07-28 | Discharge: 2015-07-28 | Disposition: A | Discharge status: Home / Self Care | Payer: BLUE CROSS/BLUE SHIELD | Source: Ambulatory Visit | Attending: Cardiothoracic Surgery | Admitting: Cardiothoracic Surgery

## 2015-07-28 ENCOUNTER — Encounter (HOSPITAL_COMMUNITY)
Admission: RE | Admit: 2015-07-28 | Discharge: 2015-07-28 | Disposition: A | Discharge status: Home / Self Care | Payer: BLUE CROSS/BLUE SHIELD | Source: Ambulatory Visit | Attending: Cardiothoracic Surgery | Admitting: Cardiothoracic Surgery

## 2015-07-28 ENCOUNTER — Ambulatory Visit (HOSPITAL_COMMUNITY)
Admission: RE | Admit: 2015-07-28 | Discharge: 2015-07-28 | Disposition: A | Discharge status: Home / Self Care | Payer: BLUE CROSS/BLUE SHIELD | Source: Ambulatory Visit | Attending: Cardiothoracic Surgery | Admitting: Cardiothoracic Surgery

## 2015-07-28 ENCOUNTER — Encounter (HOSPITAL_COMMUNITY): Payer: BLUE CROSS/BLUE SHIELD

## 2015-07-28 ENCOUNTER — Encounter (HOSPITAL_COMMUNITY): Payer: Self-pay

## 2015-07-28 VITALS — BP 146/72 | HR 69 | Temp 98.6°F | Resp 20 | Ht 68.0 in | Wt 187.6 lb

## 2015-07-28 DIAGNOSIS — Z0183 Encounter for blood typing: Secondary | ICD-10-CM | POA: Insufficient documentation

## 2015-07-28 DIAGNOSIS — I771 Stricture of artery: Secondary | ICD-10-CM | POA: Insufficient documentation

## 2015-07-28 DIAGNOSIS — I6523 Occlusion and stenosis of bilateral carotid arteries: Secondary | ICD-10-CM | POA: Diagnosis not present

## 2015-07-28 DIAGNOSIS — I081 Rheumatic disorders of both mitral and tricuspid valves: Secondary | ICD-10-CM | POA: Diagnosis not present

## 2015-07-28 DIAGNOSIS — Z01818 Encounter for other preprocedural examination: Secondary | ICD-10-CM | POA: Insufficient documentation

## 2015-07-28 DIAGNOSIS — I25119 Atherosclerotic heart disease of native coronary artery with unspecified angina pectoris: Secondary | ICD-10-CM

## 2015-07-28 DIAGNOSIS — I2510 Atherosclerotic heart disease of native coronary artery without angina pectoris: Secondary | ICD-10-CM | POA: Diagnosis not present

## 2015-07-28 DIAGNOSIS — Z951 Presence of aortocoronary bypass graft: Secondary | ICD-10-CM | POA: Insufficient documentation

## 2015-07-28 DIAGNOSIS — Z01812 Encounter for preprocedural laboratory examination: Secondary | ICD-10-CM | POA: Insufficient documentation

## 2015-07-28 DIAGNOSIS — I700 Atherosclerosis of aorta: Secondary | ICD-10-CM | POA: Diagnosis not present

## 2015-07-28 HISTORY — DX: Unspecified osteoarthritis, unspecified site: M19.90

## 2015-07-28 LAB — PULMONARY FUNCTION TEST
DL/VA % pred: 84 %
DL/VA: 3.78 ml/min/mmHg/L
DLCO unc % pred: 73 %
DLCO unc: 21.89 ml/min/mmHg
FEF 25-75 Post: 3.75 L/s
FEF 25-75 Pre: 3.7 L/s
FEF2575-%Change-Post: 1 %
FEF2575-%Pred-Post: 139 %
FEF2575-%Pred-Pre: 136 %
FEV1-%Change-Post: 0 %
FEV1-%Pred-Post: 100 %
FEV1-%Pred-Pre: 100 %
FEV1-Post: 3.31 L
FEV1-Pre: 3.31 L
FEV1FVC-%Change-Post: 1 %
FEV1FVC-%Pred-Pre: 109 %
FEV6-%Change-Post: -1 %
FEV6-%Pred-Post: 94 %
FEV6-%Pred-Pre: 96 %
FEV6-Post: 3.96 L
FEV6-Pre: 4.01 L
FEV6FVC-%Pred-Post: 105 %
FEV6FVC-%Pred-Pre: 105 %
FVC-%Change-Post: -1 %
FVC-%Pred-Post: 90 %
FVC-%Pred-Pre: 91 %
FVC-Post: 3.96 L
FVC-Pre: 4.01 L
Post FEV1/FVC ratio: 84 %
Post FEV6/FVC ratio: 100 %
Pre FEV1/FVC ratio: 82 %
Pre FEV6/FVC Ratio: 100 %
RV % pred: 113 %
RV: 2.46 L
TLC % pred: 95 %
TLC: 6.32 L

## 2015-07-28 LAB — COMPREHENSIVE METABOLIC PANEL
ALT: 34 U/L (ref 17–63)
AST: 33 U/L (ref 15–41)
Albumin: 4.2 g/dL (ref 3.5–5.0)
Alkaline Phosphatase: 48 U/L (ref 38–126)
Anion gap: 13 (ref 5–15)
BUN: 17 mg/dL (ref 6–20)
CO2: 25 mmol/L (ref 22–32)
Calcium: 9.4 mg/dL (ref 8.9–10.3)
Chloride: 101 mmol/L (ref 101–111)
Creatinine, Ser: 0.94 mg/dL (ref 0.61–1.24)
GFR calc Af Amer: >60 mL/min (ref >60)
GFR calc non Af Amer: >60 mL/min (ref >60)
Glucose, Bld: 145 mg/dL — ABNORMAL HIGH (ref 65–99)
Potassium: 4.2 mmol/L (ref 3.5–5.1)
Sodium: 139 mmol/L (ref 135–145)
Total Bilirubin: 0.4 mg/dL (ref 0.3–1.2)
Total Protein: 7.3 g/dL (ref 6.5–8.1)

## 2015-07-28 LAB — ABO/RH: ABO/RH(D): O POS

## 2015-07-28 LAB — APTT: aPTT: 26 seconds (ref 24–37)

## 2015-07-28 LAB — CBC
HCT: 43.5 % (ref 39.0–52.0)
Hemoglobin: 15.1 g/dL (ref 13.0–17.0)
MCH: 34 pg (ref 26.0–34.0)
MCHC: 34.7 g/dL (ref 30.0–36.0)
MCV: 98 fL (ref 78.0–100.0)
Platelets: 121 10*3/uL — ABNORMAL LOW (ref 150–400)
RBC: 4.44 MIL/uL (ref 4.22–5.81)
RDW: 13.3 % (ref 11.5–15.5)
WBC: 6.5 10*3/uL (ref 4.0–10.5)

## 2015-07-28 LAB — PROTIME-INR
INR: 1.04 (ref 0.00–1.49)
Prothrombin Time: 13.8 seconds (ref 11.6–15.2)

## 2015-07-28 LAB — SURGICAL PCR SCREEN
MRSA, PCR: NEGATIVE
Staphylococcus aureus: POSITIVE — AB

## 2015-07-28 MED ORDER — ALBUTEROL SULFATE (2.5 MG/3ML) 0.083% IN NEBU
2.5000 mg | INHALATION_SOLUTION | Freq: Once | RESPIRATORY_TRACT | Status: AC
  Administered 2015-07-28: 2.5 mg via RESPIRATORY_TRACT

## 2015-07-28 NOTE — Progress Notes (Signed)
Cardiologist is Dr.Hochrein  Medical MD  Echo report in epic from 07-28-15  Stress test in epic from 07/2010  Heart cath  EKG and CXR in epic from 07-11-15

## 2015-07-28 NOTE — Progress Notes (Addendum)
VASCULAR LAB PRELIMINARY  PRELIMINARY  PRELIMINARY  PRELIMINARY  Pre-op Cardiac Surgery  Carotid Findings:  Bilateral:  1-39% ICA stenosis.  Vertebral artery flow is antegrade.     Upper Extremity Right Left  Brachial Pressures 133 Triphasic 134 Triphasic  Radial Waveforms Triphasic Triphasic  Ulnar Waveforms Triphasic Triphasic  Palmar Arch (Allen's Test) Abnormal Normal   Findings:  Doppler waveforms diminished greater than 50% with radial compression and remained normal with ulnar compression. Left Doppler waverforms remained normal with both radial and ulnar compressions.    Lower  Extremity Right Left  Dorsalis Pedis 139 Triphasic 140 Triphasic  Posterior Tibial 140 Triphasic 134 Triphasic  Ankle/Brachial Indices 1.04 1.04    Findings:  ABIs and Doppler waveforms are normal bilaterally at rest.   Calyse Murcia, RVS 07/28/2015, 3:52 PM   13

## 2015-07-28 NOTE — Progress Notes (Signed)
  Echocardiogram 2D Echocardiogram has been performed.  Manuel Bell 07/28/2015, 12:08 PM

## 2015-07-28 NOTE — Progress Notes (Signed)
I called a prescription [facility]int[facility], [place], [place], [place]

## 2015-07-28 NOTE — Pre-Procedure Instructions (Signed)
ZHAYDEN LONGOBARDI  07/28/2015      [id number] [place] [place] [address] [place] [address] Phone: [phone] Fax: [phone]  [facility] [address] [place] - [id number] [place] [place] [address] Phone: [phone] Fax: [phone]    Your procedure is scheduled on Tues, Feb 7 @ 7:30 AM  Report to [facility] Admitting at 5:30 AM  Call this number if you have problems the morning of surgery:  [phone]   Remember:  Do not eat food or drink liquids after midnight.  Take these medicines the morning of surgery with A SIP OF WATER Imdur(Isosorbide),Prevacid(Lansoprazole),and Metoprolol(Lopressor)             Stop taking your Fish Oil and Aleve. No Goody's,BC's,Advil,Motrin,or any Herbal Medications.    Do not wear jewelry.  Do not wear lotions, powders, or colognes.    Men may shave face and neck.  Do not bring valuables to the hospital.  [facility] is not responsible for any belongings or valuables.  Contacts, dentures or bridgework may not be worn into surgery.  Leave your suitcase in the car.  After surgery it may be brought to your room.  For patients admitted to the hospital, discharge time will be determined by your treatment team.  Patients discharged the day of surgery will not be allowed to drive home.    Special instructions:  [place] - Preparing for Surgery  Before surgery, you can play an important role.  Because skin is not sterile, your skin needs to be as free of germs as possible.  You can reduce the number of germs on you skin by washing with CHG (chlorahexidine gluconate) soap before surgery.  CHG is an antiseptic cleaner which kills germs and bonds with the skin to continue killing germs even after washing.  Please DO NOT use if you have an allergy to CHG or antibacterial soaps.  If your skin becomes  reddened/irritated stop using the CHG and inform your nurse when you arrive at Short Stay.  Do not shave (including legs and underarms) for at least 48 hours prior to the first CHG shower.  You may shave your face.  Please follow these instructions carefully:   1.  Shower with CHG Soap the night before surgery and the                                morning of Surgery.  2.  If you choose to wash your hair, wash your hair first as usual with your       normal shampoo.  3.  After you shampoo, rinse your hair and body thoroughly to remove the                      Shampoo.  4.  Use CHG as you would any other liquid soap.  You can apply chg directly       to the skin and wash gently with scrungie or a clean washcloth.  5.  Apply the CHG Soap to your body ONLY FROM THE NECK DOWN.        Do not use on open wounds or open sores.  Avoid contact with your eyes,       ears, mouth and genitals (private parts).  Wash genitals (private parts)  with your normal soap.  6.  Wash thoroughly, paying special attention to the area where your surgery        will be performed.  7.  Thoroughly rinse your body with warm water from the neck down.  8.  DO NOT shower/wash with your normal soap after using and rinsing off       the CHG Soap.  9.  Pat yourself dry with a clean towel.            10.  Wear clean pajamas.            11.  Place clean sheets on your bed the night of your first shower and do not        sleep with pets.  Day of Surgery  Do not apply any lotions/deoderants the morning of surgery.  Please wear clean clothes to the hospital/surgery center.    Please read over the following fact sheets that you were given. Pain Booklet, Coughing and Deep Breathing, Blood Transfusion Information, MRSA Information and Surgical Site Infection Prevention

## 2015-07-29 LAB — HEMOGLOBIN A1C
Hgb A1c MFr Bld: 5.4 % (ref 4.8–5.6)
Mean Plasma Glucose: 108 mg/dL

## 2015-07-31 MED ORDER — NITROGLYCERIN IN D5W 200-5 MCG/ML-% IV SOLN
2.0000 ug/min | INTRAVENOUS | Status: AC
  Administered 2015-08-01: 5 ug/min via INTRAVENOUS
  Filled 2015-07-31: qty 250

## 2015-07-31 MED ORDER — DEXMEDETOMIDINE HCL IN NACL 400 MCG/100ML IV SOLN
0.1000 ug/kg/h | INTRAVENOUS | Status: AC
  Administered 2015-08-01: .2 ug/kg/h via INTRAVENOUS
  Filled 2015-07-31: qty 100

## 2015-07-31 MED ORDER — DEXTROSE 5 % IV SOLN
0.0000 ug/min | INTRAVENOUS | Status: DC
  Filled 2015-07-31: qty 4

## 2015-07-31 MED ORDER — SODIUM CHLORIDE 0.9 % IV SOLN
INTRAVENOUS | Status: DC
  Filled 2015-07-31: qty 30

## 2015-07-31 MED ORDER — DEXTROSE 5 % IV SOLN
750.0000 mg | INTRAVENOUS | Status: DC
  Filled 2015-07-31: qty 750

## 2015-07-31 MED ORDER — CHLORHEXIDINE GLUCONATE 0.12 % MT SOLN
15.0000 mL | Freq: Once | OROMUCOSAL | Status: DC
  Filled 2015-07-31: qty 15

## 2015-07-31 MED ORDER — SODIUM CHLORIDE 0.9 % IV SOLN
INTRAVENOUS | Status: AC
  Administered 2015-08-01: 1 [IU]/h via INTRAVENOUS
  Filled 2015-07-31: qty 2.5

## 2015-07-31 MED ORDER — DEXTROSE 5 % IV SOLN
1.5000 g | INTRAVENOUS | Status: AC
  Administered 2015-08-01: 1.5 g via INTRAVENOUS
  Administered 2015-08-01: .75 g via INTRAVENOUS
  Filled 2015-07-31: qty 1.5

## 2015-07-31 MED ORDER — AMINOCAPROIC ACID 250 MG/ML IV SOLN
INTRAVENOUS | Status: AC
  Administered 2015-08-01: 69.8 mL/h via INTRAVENOUS
  Filled 2015-07-31: qty 40

## 2015-07-31 MED ORDER — DOPAMINE-DEXTROSE 3.2-5 MG/ML-% IV SOLN
0.0000 ug/kg/min | INTRAVENOUS | Status: AC
  Administered 2015-08-01: 3 ug/kg/min via INTRAVENOUS
  Filled 2015-07-31: qty 250

## 2015-07-31 MED ORDER — MAGNESIUM SULFATE 50 % IJ SOLN
40.0000 meq | INTRAMUSCULAR | Status: DC
  Filled 2015-07-31: qty 10

## 2015-07-31 MED ORDER — SODIUM CHLORIDE 0.9 % IV SOLN
1250.0000 mg | INTRAVENOUS | Status: AC
  Administered 2015-08-01: 1250 mg via INTRAVENOUS
  Filled 2015-07-31: qty 1250

## 2015-07-31 MED ORDER — POTASSIUM CHLORIDE 2 MEQ/ML IV SOLN
80.0000 meq | INTRAVENOUS | Status: DC
  Filled 2015-07-31: qty 40

## 2015-07-31 MED ORDER — METOPROLOL TARTRATE 12.5 MG HALF TABLET: 12.5000 mg | ORAL_TABLET | Freq: Once | ORAL | Status: DC

## 2015-07-31 MED ORDER — CHLORHEXIDINE GLUCONATE 4 % EX LIQD: 30.0000 mL | CUTANEOUS | Status: DC

## 2015-07-31 MED ORDER — PHENYLEPHRINE HCL 10 MG/ML IJ SOLN
30.0000 ug/min | INTRAMUSCULAR | Status: AC
  Administered 2015-08-01: 10 ug/min via INTRAVENOUS
  Filled 2015-07-31: qty 2

## 2015-07-31 MED ORDER — PLASMA-LYTE 148 IV SOLN
INTRAVENOUS | Status: AC
  Administered 2015-08-01: 500 mL
  Filled 2015-07-31: qty 2.5

## 2015-07-31 NOTE — Anesthesia Preprocedure Evaluation (Addendum)
Anesthesia Evaluation  Patient identified by MRN, date of birth, ID band Patient awake    Reviewed: Allergy & Precautions, NPO status , Patient's Chart, lab work & pertinent test results, reviewed documented beta blocker date and time   History of Anesthesia Complications (+) PONV, PROLONGED EMERGENCE and history of anesthetic complications  Airway Mallampati: II  TM Distance: >3 FB Neck ROM: Full    Dental  (+) Teeth Intact, Dental Advisory Given   Pulmonary asthma , former smoker,    Pulmonary exam normal breath sounds clear to auscultation       Cardiovascular hypertension, Pt. on home beta blockers and Pt. on medications + angina + CAD and + CABG  Normal cardiovascular exam Rhythm:Regular Rate:Normal  S/P CABG x 5 09/24/2011 --LIMA to LAD, SVG-D1, SVG-distal RCA, SVG-RI/distal circumflex  TTE 07/28/15: Study Conclusions  - Left ventricle: The cavity size was normal. Wall thickness wasnormal. Systolic function was normal. The estimated ejectionfraction was in the range of 50% to 55%. Wall motion was normal;there were no regional wall motion abnormalities. - Mitral valve: There was mild regurgitation. - Left atrium: The atrium was mildly dilated.  LHC 07/14/15: 1. Severe native vessel CAD s/p 5V CABG with 2/5 patent bypass grafts.  2. The LAD is occluded in the mid segment. The mid and distal LAD fill from the patent IMA graft. The Diagonal branch fills from the patent vein graft.  3. The Circumflex artery is heavily calcified in the proximal segment and there is a 99% calcified stenosis in the proximal segment just before the takeoff of the moderate caliber obtuse marginal branch. The obtuse marginal branch has ostial and proximal 99% stenosis. The vein graft that supplied these branches is now occluded.  4. The entire left main artery is calcified with diffuse 60% stenosis.  5. The native RCA has 30% mid stenosis. The vein  graft to this artery is occluded.  6. Normal LV systolic function.  7. Ascending aorta is normal in caliber.   Recommendations: Will review his films with my colleagues. He has high grade left main stenosis as well as severe proximal Circumflex stenosis at the takeoff of the first OM branch. The entire diseased area is heavily calcified. Will consider staged rotablator atherectomy of the left main/Circumflex/OM vs redo CABG. I will let him go home today and we will follow up with plans for intervention. Will start Plavix.     Neuro/Psych Neuralgia Lumbar radiculopathy    GI/Hepatic GERD  Medicated,(+) Hepatitis -, CHepatitis C s/p interferon/[facility] Rx; chronically elevated LFTs. treated 2015- 2016- Completed 07/2014     Endo/Other  negative endocrine ROS  Renal/GU negative Renal ROS     Musculoskeletal  (+) Arthritis , Osteoarthritis,    Abdominal   Peds  Hematology  (+) Blood dyscrasia (Plavix), , 07/28/15: Hgb 15.1; Plt 121k   Anesthesia Other Findings Day of surgery medications reviewed with the patient.  Reproductive/Obstetrics                        Anesthesia Physical Anesthesia Plan  ASA: IV  Anesthesia Plan: General   Post-op Pain Management:    Induction: Intravenous  Airway Management Planned: Oral ETT  Additional Equipment: Arterial line, CVP, PA Cath, TEE and Ultrasound Guidance Line Placement  Intra-op Plan:   Post-operative Plan: Post-operative intubation/ventilation  Informed Consent: I have reviewed the patients History and Physical, chart, labs and discussed the procedure including the risks, benefits and alternatives for the proposed anesthesia  with the patient or authorized representative who has indicated his/her understanding and acceptance.   Dental advisory given  Plan Discussed with: CRNA  Anesthesia Plan Comments: (Risks/benefits of general anesthesia discussed with patient including risk of damage to teeth, lips,  gum, and tongue, nausea/vomiting, allergic reactions to medications, and the possibility of heart attack, stroke and death.  All patient questions answered.  Patient wishes to proceed.)        Anesthesia Quick Evaluation

## 2015-08-01 ENCOUNTER — Encounter (HOSPITAL_COMMUNITY)
Admission: AD | Disposition: A | Discharge status: Home / Self Care | Payer: Self-pay | Source: Ambulatory Visit | Attending: Cardiothoracic Surgery

## 2015-08-01 ENCOUNTER — Inpatient Hospital Stay (HOSPITAL_COMMUNITY)
Admission: RE | Admit: 2015-08-01 | Discharge: 2015-08-01 | Disposition: A | Discharge status: Home / Self Care | Payer: BLUE CROSS/BLUE SHIELD | Source: Ambulatory Visit | Attending: Cardiothoracic Surgery | Admitting: Cardiothoracic Surgery

## 2015-08-01 ENCOUNTER — Inpatient Hospital Stay (HOSPITAL_COMMUNITY): Payer: BLUE CROSS/BLUE SHIELD

## 2015-08-01 ENCOUNTER — Inpatient Hospital Stay (HOSPITAL_COMMUNITY): Payer: BLUE CROSS/BLUE SHIELD | Admitting: Anesthesiology

## 2015-08-01 ENCOUNTER — Inpatient Hospital Stay (HOSPITAL_COMMUNITY)
Admission: AD | Admit: 2015-08-01 | Discharge: 2015-08-08 | DRG: 229 | Disposition: A | Discharge status: Home / Self Care | Payer: BLUE CROSS/BLUE SHIELD | Source: Ambulatory Visit | Attending: Cardiothoracic Surgery | Admitting: Cardiothoracic Surgery

## 2015-08-01 DIAGNOSIS — E8770 Fluid overload, unspecified: Secondary | ICD-10-CM | POA: Diagnosis not present

## 2015-08-01 DIAGNOSIS — J45909 Unspecified asthma, uncomplicated: Secondary | ICD-10-CM | POA: Diagnosis present

## 2015-08-01 DIAGNOSIS — I2510 Atherosclerotic heart disease of native coronary artery without angina pectoris: Secondary | ICD-10-CM

## 2015-08-01 DIAGNOSIS — K766 Portal hypertension: Secondary | ICD-10-CM | POA: Diagnosis present

## 2015-08-01 DIAGNOSIS — I25110 Atherosclerotic heart disease of native coronary artery with unstable angina pectoris: Principal | ICD-10-CM | POA: Diagnosis present

## 2015-08-01 DIAGNOSIS — R079 Chest pain, unspecified: Secondary | ICD-10-CM | POA: Diagnosis present

## 2015-08-01 DIAGNOSIS — E876 Hypokalemia: Secondary | ICD-10-CM | POA: Diagnosis not present

## 2015-08-01 DIAGNOSIS — Z87891 Personal history of nicotine dependence: Secondary | ICD-10-CM | POA: Diagnosis not present

## 2015-08-01 DIAGNOSIS — I10 Essential (primary) hypertension: Secondary | ICD-10-CM | POA: Diagnosis present

## 2015-08-01 DIAGNOSIS — K219 Gastro-esophageal reflux disease without esophagitis: Secondary | ICD-10-CM | POA: Diagnosis present

## 2015-08-01 DIAGNOSIS — I4891 Unspecified atrial fibrillation: Secondary | ICD-10-CM | POA: Diagnosis present

## 2015-08-01 DIAGNOSIS — Z951 Presence of aortocoronary bypass graft: Secondary | ICD-10-CM | POA: Diagnosis not present

## 2015-08-01 DIAGNOSIS — Z7902 Long term (current) use of antithrombotics/antiplatelets: Secondary | ICD-10-CM | POA: Diagnosis not present

## 2015-08-01 DIAGNOSIS — M1990 Unspecified osteoarthritis, unspecified site: Secondary | ICD-10-CM | POA: Diagnosis present

## 2015-08-01 DIAGNOSIS — Z79899 Other long term (current) drug therapy: Secondary | ICD-10-CM

## 2015-08-01 DIAGNOSIS — E785 Hyperlipidemia, unspecified: Secondary | ICD-10-CM | POA: Diagnosis present

## 2015-08-01 DIAGNOSIS — D62 Acute posthemorrhagic anemia: Secondary | ICD-10-CM | POA: Diagnosis not present

## 2015-08-01 DIAGNOSIS — D696 Thrombocytopenia, unspecified: Secondary | ICD-10-CM | POA: Diagnosis not present

## 2015-08-01 DIAGNOSIS — B1920 Unspecified viral hepatitis C without hepatic coma: Secondary | ICD-10-CM | POA: Diagnosis present

## 2015-08-01 DIAGNOSIS — D689 Coagulation defect, unspecified: Secondary | ICD-10-CM | POA: Diagnosis not present

## 2015-08-01 DIAGNOSIS — J939 Pneumothorax, unspecified: Secondary | ICD-10-CM

## 2015-08-01 DIAGNOSIS — K7460 Unspecified cirrhosis of liver: Secondary | ICD-10-CM | POA: Diagnosis present

## 2015-08-01 DIAGNOSIS — I25119 Atherosclerotic heart disease of native coronary artery with unspecified angina pectoris: Secondary | ICD-10-CM

## 2015-08-01 HISTORY — PX: RADIAL ARTERY HARVEST: SHX5067

## 2015-08-01 HISTORY — PX: CORONARY ARTERY BYPASS GRAFT: SHX141

## 2015-08-01 HISTORY — PX: TEE WITHOUT CARDIOVERSION: SHX5443

## 2015-08-01 LAB — POCT I-STAT 3, ART BLOOD GAS (G3+)
Acid-Base Excess: 5 mmol/L — ABNORMAL HIGH (ref 0.0–2.0)
Acid-Base Excess: 4 mmol/L — ABNORMAL HIGH (ref 0.0–2.0)
Acid-Base Excess: 1 mmol/L (ref 0.0–2.0)
Acid-base deficit: 2 mmol/L (ref 0.0–2.0)
Bicarbonate: 30.9 mEq/L — ABNORMAL HIGH (ref 20.0–24.0)
Bicarbonate: 28.5 mEq/L — ABNORMAL HIGH (ref 20.0–24.0)
Bicarbonate: 27 mEq/L — ABNORMAL HIGH (ref 20.0–24.0)
Bicarbonate: 24 mEq/L (ref 20.0–24.0)
O2 Saturation: 100 %
O2 Saturation: 100 %
O2 Saturation: 100 %
O2 Saturation: 90 %
Patient temperature: 35.8
TCO2: 32 mmol/L (ref 0–100)
TCO2: 30 mmol/L (ref 0–100)
TCO2: 28 mmol/L (ref 0–100)
TCO2: 25 mmol/L (ref 0–100)
pCO2 arterial: 48.3 mmHg — ABNORMAL HIGH (ref 35.0–45.0)
pCO2 arterial: 43.2 mmHg (ref 35.0–45.0)
pCO2 arterial: 49.3 mmHg — ABNORMAL HIGH (ref 35.0–45.0)
pCO2 arterial: 42.3 mmHg (ref 35.0–45.0)
pH, Arterial: 7.414 (ref 7.350–7.450)
pH, Arterial: 7.427 (ref 7.350–7.450)
pH, Arterial: 7.347 — ABNORMAL LOW (ref 7.350–7.450)
pH, Arterial: 7.357 (ref 7.350–7.450)
pO2, Arterial: 444 mmHg — ABNORMAL HIGH (ref 80.0–100.0)
pO2, Arterial: 466 mmHg — ABNORMAL HIGH (ref 80.0–100.0)
pO2, Arterial: 362 mmHg — ABNORMAL HIGH (ref 80.0–100.0)
pO2, Arterial: 57 mmHg — ABNORMAL LOW (ref 80.0–100.0)

## 2015-08-01 LAB — TYPE AND SCREEN
ABO/RH(D): O POS
Antibody Screen: NEGATIVE

## 2015-08-01 LAB — POCT I-STAT, CHEM 8
BUN: 14 mg/dL (ref 6–20)
BUN: 14 mg/dL (ref 6–20)
BUN: 10 mg/dL (ref 6–20)
BUN: 13 mg/dL (ref 6–20)
BUN: 14 mg/dL (ref 6–20)
BUN: 11 mg/dL (ref 6–20)
BUN: 11 mg/dL (ref 6–20)
BUN: 11 mg/dL (ref 6–20)
BUN: 9 mg/dL (ref 6–20)
BUN: 7 mg/dL (ref 6–20)
BUN: 7 mg/dL (ref 6–20)
Calcium, Ion: 1.15 mmol/L (ref 1.13–1.30)
Calcium, Ion: 1.19 mmol/L (ref 1.13–1.30)
Calcium, Ion: 0.91 mmol/L — ABNORMAL LOW (ref 1.13–1.30)
Calcium, Ion: 1.17 mmol/L (ref 1.13–1.30)
Calcium, Ion: 1.2 mmol/L (ref 1.13–1.30)
Calcium, Ion: 0.98 mmol/L — ABNORMAL LOW (ref 1.13–1.30)
Calcium, Ion: 0.88 mmol/L — ABNORMAL LOW (ref 1.13–1.30)
Calcium, Ion: 0.89 mmol/L — ABNORMAL LOW (ref 1.13–1.30)
Calcium, Ion: 0.74 mmol/L — ABNORMAL LOW (ref 1.13–1.30)
Calcium, Ion: 0.61 mmol/L — CL (ref 1.13–1.30)
Calcium, Ion: 0.96 mmol/L — ABNORMAL LOW (ref 1.13–1.30)
Chloride: 102 mmol/L (ref 101–111)
Chloride: 101 mmol/L (ref 101–111)
Chloride: 101 mmol/L (ref 101–111)
Chloride: 101 mmol/L (ref 101–111)
Chloride: 99 mmol/L — ABNORMAL LOW (ref 101–111)
Chloride: 100 mmol/L — ABNORMAL LOW (ref 101–111)
Chloride: 96 mmol/L — ABNORMAL LOW (ref 101–111)
Chloride: 98 mmol/L — ABNORMAL LOW (ref 101–111)
Chloride: 100 mmol/L — ABNORMAL LOW (ref 101–111)
Chloride: 97 mmol/L — ABNORMAL LOW (ref 101–111)
Chloride: 99 mmol/L — ABNORMAL LOW (ref 101–111)
Creatinine, Ser: 0.5 mg/dL — ABNORMAL LOW (ref 0.61–1.24)
Creatinine, Ser: 0.6 mg/dL — ABNORMAL LOW (ref 0.61–1.24)
Creatinine, Ser: 0.3 mg/dL — ABNORMAL LOW (ref 0.61–1.24)
Creatinine, Ser: 0.5 mg/dL — ABNORMAL LOW (ref 0.61–1.24)
Creatinine, Ser: 0.5 mg/dL — ABNORMAL LOW (ref 0.61–1.24)
Creatinine, Ser: 0.4 mg/dL — ABNORMAL LOW (ref 0.61–1.24)
Creatinine, Ser: 0.4 mg/dL — ABNORMAL LOW (ref 0.61–1.24)
Creatinine, Ser: 0.4 mg/dL — ABNORMAL LOW (ref 0.61–1.24)
Creatinine, Ser: 0.4 mg/dL — ABNORMAL LOW (ref 0.61–1.24)
Creatinine, Ser: 0.3 mg/dL — ABNORMAL LOW (ref 0.61–1.24)
Creatinine, Ser: <0.2 mg/dL — ABNORMAL LOW (ref 0.61–1.24)
Glucose, Bld: 110 mg/dL — ABNORMAL HIGH (ref 65–99)
Glucose, Bld: 115 mg/dL — ABNORMAL HIGH (ref 65–99)
Glucose, Bld: 107 mg/dL — ABNORMAL HIGH (ref 65–99)
Glucose, Bld: 108 mg/dL — ABNORMAL HIGH (ref 65–99)
Glucose, Bld: 90 mg/dL (ref 65–99)
Glucose, Bld: 137 mg/dL — ABNORMAL HIGH (ref 65–99)
Glucose, Bld: 145 mg/dL — ABNORMAL HIGH (ref 65–99)
Glucose, Bld: 155 mg/dL — ABNORMAL HIGH (ref 65–99)
Glucose, Bld: 125 mg/dL — ABNORMAL HIGH (ref 65–99)
Glucose, Bld: 119 mg/dL — ABNORMAL HIGH (ref 65–99)
Glucose, Bld: 161 mg/dL — ABNORMAL HIGH (ref 65–99)
HCT: 36 % — ABNORMAL LOW (ref 39.0–52.0)
HCT: 37 % — ABNORMAL LOW (ref 39.0–52.0)
HCT: 26 % — ABNORMAL LOW (ref 39.0–52.0)
HCT: 32 % — ABNORMAL LOW (ref 39.0–52.0)
HCT: 36 % — ABNORMAL LOW (ref 39.0–52.0)
HCT: 27 % — ABNORMAL LOW (ref 39.0–52.0)
HCT: 24 % — ABNORMAL LOW (ref 39.0–52.0)
HCT: 31 % — ABNORMAL LOW (ref 39.0–52.0)
HCT: 22 % — ABNORMAL LOW (ref 39.0–52.0)
HCT: 22 % — ABNORMAL LOW (ref 39.0–52.0)
HCT: 33 % — ABNORMAL LOW (ref 39.0–52.0)
Hemoglobin: 12.2 g/dL — ABNORMAL LOW (ref 13.0–17.0)
Hemoglobin: 12.6 g/dL — ABNORMAL LOW (ref 13.0–17.0)
Hemoglobin: 10.9 g/dL — ABNORMAL LOW (ref 13.0–17.0)
Hemoglobin: 12.2 g/dL — ABNORMAL LOW (ref 13.0–17.0)
Hemoglobin: 8.8 g/dL — ABNORMAL LOW (ref 13.0–17.0)
Hemoglobin: 9.2 g/dL — ABNORMAL LOW (ref 13.0–17.0)
Hemoglobin: 8.2 g/dL — ABNORMAL LOW (ref 13.0–17.0)
Hemoglobin: 10.5 g/dL — ABNORMAL LOW (ref 13.0–17.0)
Hemoglobin: 7.5 g/dL — ABNORMAL LOW (ref 13.0–17.0)
Hemoglobin: 11.2 g/dL — ABNORMAL LOW (ref 13.0–17.0)
Hemoglobin: 7.5 g/dL — ABNORMAL LOW (ref 13.0–17.0)
Potassium: 3.4 mmol/L — ABNORMAL LOW (ref 3.5–5.1)
Potassium: 3.5 mmol/L (ref 3.5–5.1)
Potassium: 3.5 mmol/L (ref 3.5–5.1)
Potassium: 3.5 mmol/L (ref 3.5–5.1)
Potassium: 3.5 mmol/L (ref 3.5–5.1)
Potassium: 5.1 mmol/L (ref 3.5–5.1)
Potassium: 5.3 mmol/L — ABNORMAL HIGH (ref 3.5–5.1)
Potassium: 5.6 mmol/L — ABNORMAL HIGH (ref 3.5–5.1)
Potassium: 5 mmol/L (ref 3.5–5.1)
Potassium: 3.7 mmol/L (ref 3.5–5.1)
Potassium: 4 mmol/L (ref 3.5–5.1)
Sodium: 141 mmol/L (ref 135–145)
Sodium: 140 mmol/L (ref 135–145)
Sodium: 140 mmol/L (ref 135–145)
Sodium: 141 mmol/L (ref 135–145)
Sodium: 143 mmol/L (ref 135–145)
Sodium: 137 mmol/L (ref 135–145)
Sodium: 133 mmol/L — ABNORMAL LOW (ref 135–145)
Sodium: 132 mmol/L — ABNORMAL LOW (ref 135–145)
Sodium: 136 mmol/L (ref 135–145)
Sodium: 140 mmol/L (ref 135–145)
Sodium: 141 mmol/L (ref 135–145)
TCO2: 33 mmol/L (ref 0–100)
TCO2: 30 mmol/L (ref 0–100)
TCO2: 30 mmol/L (ref 0–100)
TCO2: 30 mmol/L (ref 0–100)
TCO2: 31 mmol/L (ref 0–100)
TCO2: 29 mmol/L (ref 0–100)
TCO2: 28 mmol/L (ref 0–100)
TCO2: 27 mmol/L (ref 0–100)
TCO2: 28 mmol/L (ref 0–100)
TCO2: 26 mmol/L (ref 0–100)
TCO2: 27 mmol/L (ref 0–100)

## 2015-08-01 LAB — CBC
HCT: 28.8 % — ABNORMAL LOW (ref 39.0–52.0)
Hemoglobin: 9.9 g/dL — ABNORMAL LOW (ref 13.0–17.0)
MCH: 31.8 pg (ref 26.0–34.0)
MCHC: 34.4 g/dL (ref 30.0–36.0)
MCV: 92.6 fL (ref 78.0–100.0)
PLATELETS: 126 10*3/uL — AB (ref 150–400)
RBC: 3.11 MIL/uL — AB (ref 4.22–5.81)
RDW: 13.8 % (ref 11.5–15.5)
WBC: 8.6 10*3/uL (ref 4.0–10.5)

## 2015-08-01 LAB — APTT: APTT: 31 s (ref 24–37)

## 2015-08-01 LAB — POCT I-STAT 4, (NA,K, GLUC, HGB,HCT)
Glucose, Bld: 164 mg/dL — ABNORMAL HIGH (ref 65–99)
HCT: 29 % — ABNORMAL LOW (ref 39.0–52.0)
Hemoglobin: 9.9 g/dL — ABNORMAL LOW (ref 13.0–17.0)
Potassium: 3.9 mmol/L (ref 3.5–5.1)
Sodium: 141 mmol/L (ref 135–145)

## 2015-08-01 LAB — GLUCOSE, CAPILLARY
Glucose-Capillary: 113 mg/dL — ABNORMAL HIGH (ref 65–99)
Glucose-Capillary: 137 mg/dL — ABNORMAL HIGH (ref 65–99)
Glucose-Capillary: 137 mg/dL — ABNORMAL HIGH (ref 65–99)
Glucose-Capillary: 139 mg/dL — ABNORMAL HIGH (ref 65–99)
Glucose-Capillary: 165 mg/dL — ABNORMAL HIGH (ref 65–99)

## 2015-08-01 LAB — PLATELET COUNT: Platelets: 50 10*3/uL — ABNORMAL LOW (ref 150–400)

## 2015-08-01 LAB — FIBRINOGEN: Fibrinogen: 134 mg/dL — ABNORMAL LOW (ref 204–475)

## 2015-08-01 LAB — URINALYSIS, ROUTINE W REFLEX MICROSCOPIC
Glucose, UA: NEGATIVE mg/dL
Hgb urine dipstick: NEGATIVE
Ketones, ur: 15 mg/dL — AB
Leukocytes, UA: NEGATIVE
Nitrite: NEGATIVE
Protein, ur: NEGATIVE mg/dL
Specific Gravity, Urine: 1.025 (ref 1.005–1.030)
pH: 5.5 (ref 5.0–8.0)

## 2015-08-01 LAB — PREPARE RBC (CROSSMATCH)

## 2015-08-01 LAB — PROTIME-INR
INR: 1.57 — ABNORMAL HIGH (ref 0.00–1.49)
PROTHROMBIN TIME: 18.8 s — AB (ref 11.6–15.2)

## 2015-08-01 LAB — HEMOGLOBIN AND HEMATOCRIT, BLOOD
HCT: 22.5 % — ABNORMAL LOW (ref 39.0–52.0)
Hemoglobin: 8.1 g/dL — ABNORMAL LOW (ref 13.0–17.0)

## 2015-08-01 SURGERY — REDO CORONARY ARTERY BYPASS GRAFTING (CABG)
Anesthesia: General | Site: Chest

## 2015-08-01 MED ORDER — MIDAZOLAM HCL 2 MG/2ML IJ SOLN
INTRAMUSCULAR | Status: AC
  Filled 2015-08-01: qty 2

## 2015-08-01 MED ORDER — VECURONIUM BROMIDE 10 MG IV SOLR
INTRAVENOUS | Status: DC | PRN
  Administered 2015-08-01 (×7): 5 mg via INTRAVENOUS

## 2015-08-01 MED ORDER — SODIUM CHLORIDE 0.9 % IV SOLN
INTRAVENOUS | Status: DC | PRN
  Administered 2015-08-01: 18:00:00 via INTRAVENOUS

## 2015-08-01 MED ORDER — METOPROLOL TARTRATE 1 MG/ML IV SOLN
2.5000 mg | INTRAVENOUS | Status: DC | PRN
  Administered 2015-08-04 – 2015-08-07 (×2): 5 mg via INTRAVENOUS
  Filled 2015-08-01 (×2): qty 5

## 2015-08-01 MED ORDER — ACETAMINOPHEN 160 MG/5ML PO SOLN
1000.0000 mg | Freq: Four times a day (QID) | ORAL | Status: AC
  Administered 2015-08-02 (×4): 1000 mg
  Filled 2015-08-01 (×4): qty 40.6

## 2015-08-01 MED ORDER — AMIODARONE HCL IN DEXTROSE 360-4.14 MG/200ML-% IV SOLN
60.0000 mg/h | INTRAVENOUS | Status: AC
  Administered 2015-08-01 – 2015-08-02 (×2): 60 mg/h via INTRAVENOUS
  Filled 2015-08-01 (×2): qty 200

## 2015-08-01 MED ORDER — FENTANYL CITRATE (PF) 250 MCG/5ML IJ SOLN
INTRAMUSCULAR | Status: AC
  Filled 2015-08-01: qty 20

## 2015-08-01 MED ORDER — HEPARIN SODIUM (PORCINE) 1000 UNIT/ML IJ SOLN
INTRAMUSCULAR | Status: DC | PRN
  Administered 2015-08-01: 3000 [IU] via INTRAVENOUS
  Administered 2015-08-01: 24000 [IU] via INTRAVENOUS

## 2015-08-01 MED ORDER — OXYCODONE HCL 5 MG PO TABS
5.0000 mg | ORAL_TABLET | ORAL | Status: DC | PRN
  Administered 2015-08-03 – 2015-08-08 (×31): 10 mg via ORAL
  Filled 2015-08-01 (×32): qty 2

## 2015-08-01 MED ORDER — SODIUM CHLORIDE 0.9% FLUSH
3.0000 mL | Freq: Two times a day (BID) | INTRAVENOUS | Status: DC
  Administered 2015-08-02 – 2015-08-06 (×6): 3 mL via INTRAVENOUS

## 2015-08-01 MED ORDER — FENTANYL CITRATE (PF) 250 MCG/5ML IJ SOLN
INTRAMUSCULAR | Status: DC | PRN
  Administered 2015-08-01: 150 ug via INTRAVENOUS
  Administered 2015-08-01 (×2): 100 ug via INTRAVENOUS
  Administered 2015-08-01 (×2): 150 ug via INTRAVENOUS
  Administered 2015-08-01 (×3): 100 ug via INTRAVENOUS
  Administered 2015-08-01: 300 ug via INTRAVENOUS
  Administered 2015-08-01: 50 ug via INTRAVENOUS
  Administered 2015-08-01: 100 ug via INTRAVENOUS

## 2015-08-01 MED ORDER — PROPOFOL 10 MG/ML IV BOLUS
INTRAVENOUS | Status: DC | PRN
  Administered 2015-08-01: 100 mg via INTRAVENOUS

## 2015-08-01 MED ORDER — ALBUMIN HUMAN 5 % IV SOLN
250.0000 mL | INTRAVENOUS | Status: AC | PRN
  Filled 2015-08-01: qty 250

## 2015-08-01 MED ORDER — ONDANSETRON HCL 4 MG/2ML IJ SOLN
4.0000 mg | Freq: Four times a day (QID) | INTRAMUSCULAR | Status: DC | PRN
  Administered 2015-08-02 – 2015-08-07 (×6): 4 mg via INTRAVENOUS
  Filled 2015-08-01 (×6): qty 2

## 2015-08-01 MED ORDER — DOCUSATE SODIUM 100 MG PO CAPS
200.0000 mg | ORAL_CAPSULE | Freq: Every day | ORAL | Status: DC
  Administered 2015-08-03 – 2015-08-08 (×5): 200 mg via ORAL
  Filled 2015-08-01 (×5): qty 2

## 2015-08-01 MED ORDER — VANCOMYCIN HCL IN DEXTROSE 1-5 GM/200ML-% IV SOLN
1000.0000 mg | Freq: Once | INTRAVENOUS | Status: AC
  Administered 2015-08-01: 1000 mg via INTRAVENOUS
  Filled 2015-08-01: qty 200

## 2015-08-01 MED ORDER — ASPIRIN 81 MG PO CHEW
324.0000 mg | CHEWABLE_TABLET | Freq: Every day | ORAL | Status: DC
  Administered 2015-08-02: 324 mg
  Filled 2015-08-01: qty 4

## 2015-08-01 MED ORDER — MAGNESIUM SULFATE 4 GM/100ML IV SOLN
4.0000 g | Freq: Once | INTRAVENOUS | Status: AC
  Administered 2015-08-01: 4 g via INTRAVENOUS
  Filled 2015-08-01: qty 100

## 2015-08-01 MED ORDER — PROPOFOL 10 MG/ML IV BOLUS
INTRAVENOUS | Status: AC
  Filled 2015-08-01: qty 40

## 2015-08-01 MED ORDER — LACTATED RINGERS IV SOLN
500.0000 mL | Freq: Once | INTRAVENOUS | Status: DC | PRN
Start: 2015-08-01 — End: 2015-08-01

## 2015-08-01 MED ORDER — 0.9 % SODIUM CHLORIDE (POUR BTL) OPTIME
TOPICAL | Status: DC | PRN
  Administered 2015-08-01: 7000 mL
  Administered 2015-08-01: 1000 mL

## 2015-08-01 MED ORDER — ACETAMINOPHEN 160 MG/5ML PO SOLN: 650.0000 mg | Freq: Once | ORAL | Status: AC

## 2015-08-01 MED ORDER — LIDOCAINE IN D5W 4-5 MG/ML-% IV SOLN
1.0000 mg/min | INTRAVENOUS | Status: DC
  Administered 2015-08-01: 1 mg/min via INTRAVENOUS
  Filled 2015-08-01: qty 500

## 2015-08-01 MED ORDER — LACTATED RINGERS IV SOLN
INTRAVENOUS | Status: DC | PRN
Start: 2015-08-01 — End: 2015-08-01
  Administered 2015-08-01 (×4): via INTRAVENOUS

## 2015-08-01 MED ORDER — SODIUM CHLORIDE 0.9 % IV SOLN
INTRAVENOUS | Status: DC
  Administered 2015-08-01: 4 [IU]/h via INTRAVENOUS
  Filled 2015-08-01: qty 2.5

## 2015-08-01 MED ORDER — BISACODYL 5 MG PO TBEC
10.0000 mg | DELAYED_RELEASE_TABLET | Freq: Every day | ORAL | Status: DC
  Administered 2015-08-03 – 2015-08-06 (×3): 10 mg via ORAL
  Filled 2015-08-01 (×3): qty 2

## 2015-08-01 MED ORDER — MIDAZOLAM HCL 10 MG/2ML IJ SOLN
INTRAMUSCULAR | Status: AC
  Filled 2015-08-01: qty 2

## 2015-08-01 MED ORDER — ALBUMIN HUMAN 5 % IV SOLN
INTRAVENOUS | Status: DC | PRN
  Administered 2015-08-01 (×2): via INTRAVENOUS

## 2015-08-01 MED ORDER — HEMOSTATIC AGENTS (NO CHARGE) OPTIME
TOPICAL | Status: DC | PRN
  Administered 2015-08-01: 1 via TOPICAL

## 2015-08-01 MED ORDER — SODIUM CHLORIDE 0.9% FLUSH: 3.0000 mL | INTRAVENOUS | Status: DC | PRN

## 2015-08-01 MED ORDER — DOPAMINE-DEXTROSE 3.2-5 MG/ML-% IV SOLN
0.0000 ug/kg/min | INTRAVENOUS | Status: DC
  Administered 2015-08-03: 3 ug/kg/min via INTRAVENOUS
  Filled 2015-08-01: qty 250

## 2015-08-01 MED ORDER — SODIUM CHLORIDE 0.45 % IV SOLN
INTRAVENOUS | Status: DC | PRN
  Administered 2015-08-01 – 2015-08-03 (×2): via INTRAVENOUS

## 2015-08-01 MED ORDER — FAMOTIDINE IN NACL 20-0.9 MG/50ML-% IV SOLN
20.0000 mg | Freq: Two times a day (BID) | INTRAVENOUS | Status: AC
  Administered 2015-08-01 – 2015-08-02 (×2): 20 mg via INTRAVENOUS
  Filled 2015-08-01: qty 50

## 2015-08-01 MED ORDER — VECURONIUM BROMIDE 10 MG IV SOLR
INTRAVENOUS | Status: AC
  Filled 2015-08-01: qty 10

## 2015-08-01 MED ORDER — METOPROLOL TARTRATE 25 MG/10 ML ORAL SUSPENSION
12.5000 mg | Freq: Two times a day (BID) | ORAL | Status: DC
Start: 2015-08-01 — End: 2015-08-04
  Filled 2015-08-01: qty 5

## 2015-08-01 MED ORDER — MIDAZOLAM HCL 2 MG/2ML IJ SOLN
2.0000 mg | INTRAMUSCULAR | Status: DC | PRN
  Administered 2015-08-01 – 2015-08-02 (×6): 2 mg via INTRAVENOUS
  Filled 2015-08-01 (×8): qty 2

## 2015-08-01 MED ORDER — AMIODARONE LOAD VIA INFUSION
150.0000 mg | Freq: Once | INTRAVENOUS | Status: AC
  Administered 2015-08-01: 150 mg via INTRAVENOUS
  Filled 2015-08-01: qty 83.34

## 2015-08-01 MED ORDER — METOPROLOL TARTRATE 12.5 MG HALF TABLET
12.5000 mg | ORAL_TABLET | Freq: Two times a day (BID) | ORAL | Status: DC
  Administered 2015-08-03 – 2015-08-04 (×2): 12.5 mg via ORAL
  Filled 2015-08-01 (×2): qty 1

## 2015-08-01 MED ORDER — FENTANYL CITRATE (PF) 250 MCG/5ML IJ SOLN
INTRAMUSCULAR | Status: AC
  Filled 2015-08-01: qty 5

## 2015-08-01 MED ORDER — LACTATED RINGERS IV SOLN
INTRAVENOUS | Status: DC
  Administered 2015-08-02: 20 mL/h via INTRAVENOUS

## 2015-08-01 MED ORDER — INSULIN REGULAR BOLUS VIA INFUSION
0.0000 [IU] | Freq: Three times a day (TID) | INTRAVENOUS | Status: DC
  Filled 2015-08-01: qty 10

## 2015-08-01 MED ORDER — SODIUM CHLORIDE 0.9 % IV SOLN
INTRAVENOUS | Status: DC
  Administered 2015-08-01 – 2015-08-02 (×2): via INTRAVENOUS

## 2015-08-01 MED ORDER — SODIUM CHLORIDE 0.9 % IV SOLN
20.0000 ug | INTRAVENOUS | Status: AC
  Administered 2015-08-01: 20 ug via INTRAVENOUS
  Filled 2015-08-01: qty 5

## 2015-08-01 MED ORDER — ALBUMIN HUMAN 5 % IV SOLN
12.5000 g | Freq: Once | INTRAVENOUS | Status: AC
  Administered 2015-08-01: 12.5 g via INTRAVENOUS
  Filled 2015-08-01: qty 250

## 2015-08-01 MED ORDER — CHLORHEXIDINE GLUCONATE 0.12 % MT SOLN
15.0000 mL | OROMUCOSAL | Status: AC
  Administered 2015-08-01: 15 mL via OROMUCOSAL

## 2015-08-01 MED ORDER — DEXMEDETOMIDINE HCL IN NACL 200 MCG/50ML IV SOLN
0.0000 ug/kg/h | INTRAVENOUS | Status: DC
  Administered 2015-08-01 – 2015-08-02 (×4): 0.7 ug/kg/h via INTRAVENOUS
  Filled 2015-08-01: qty 50
  Filled 2015-08-01: qty 100
  Filled 2015-08-01: qty 50

## 2015-08-01 MED ORDER — ACETAMINOPHEN 650 MG RE SUPP
650.0000 mg | Freq: Once | RECTAL | Status: AC
  Administered 2015-08-01: 650 mg via RECTAL

## 2015-08-01 MED ORDER — DEXTROSE 5 % IV SOLN
0.0000 ug/min | INTRAVENOUS | Status: DC
  Administered 2015-08-02: 8 ug/min via INTRAVENOUS
  Filled 2015-08-01 (×3): qty 4

## 2015-08-01 MED ORDER — MIDAZOLAM HCL 5 MG/ML IJ SOLN
INTRAMUSCULAR | Status: DC | PRN
  Administered 2015-08-01: 2 mg via INTRAVENOUS
  Administered 2015-08-01: 4 mg via INTRAVENOUS
  Administered 2015-08-01 (×4): 2 mg via INTRAVENOUS

## 2015-08-01 MED ORDER — BISACODYL 10 MG RE SUPP: 10.0000 mg | Freq: Every day | RECTAL | Status: DC

## 2015-08-01 MED ORDER — HEMOSTATIC AGENTS (NO CHARGE) OPTIME
TOPICAL | Status: DC | PRN
  Administered 2015-08-01 (×2): 1 via TOPICAL

## 2015-08-01 MED ORDER — TRAMADOL HCL 50 MG PO TABS
50.0000 mg | ORAL_TABLET | ORAL | Status: DC | PRN
  Administered 2015-08-07: 100 mg via ORAL
  Filled 2015-08-01: qty 2

## 2015-08-01 MED ORDER — CALCIUM CHLORIDE 10 % IV SOLN
INTRAVENOUS | Status: DC | PRN
  Administered 2015-08-01: 200 mg via INTRAVENOUS
  Administered 2015-08-01: 400 mg via INTRAVENOUS
  Administered 2015-08-01: 200 mg via INTRAVENOUS

## 2015-08-01 MED ORDER — SODIUM CHLORIDE 0.9 % IV SOLN: 250.0000 mL | INTRAVENOUS | Status: DC

## 2015-08-01 MED ORDER — CALCIUM CHLORIDE 10 % IV SOLN
INTRAVENOUS | Status: AC
  Filled 2015-08-01: qty 10

## 2015-08-01 MED ORDER — DEXTROSE 5 % IV SOLN
1.5000 g | Freq: Two times a day (BID) | INTRAVENOUS | Status: AC
  Administered 2015-08-01 – 2015-08-03 (×4): 1.5 g via INTRAVENOUS
  Filled 2015-08-01 (×4): qty 1.5

## 2015-08-01 MED ORDER — ALBUMIN HUMAN 5 % IV SOLN
250.0000 mL | INTRAVENOUS | Status: DC | PRN
  Administered 2015-08-01 (×2): 250 mL via INTRAVENOUS

## 2015-08-01 MED ORDER — PROTAMINE SULFATE 10 MG/ML IV SOLN
INTRAVENOUS | Status: DC | PRN
  Administered 2015-08-01: 30 mg via INTRAVENOUS
  Administered 2015-08-01: 20 mg via INTRAVENOUS
  Administered 2015-08-01: 40 mg via INTRAVENOUS
  Administered 2015-08-01: 20 mg via INTRAVENOUS
  Administered 2015-08-01: 40 mg via INTRAVENOUS
  Administered 2015-08-01 (×4): 30 mg via INTRAVENOUS

## 2015-08-01 MED ORDER — ASPIRIN EC 325 MG PO TBEC
325.0000 mg | DELAYED_RELEASE_TABLET | Freq: Every day | ORAL | Status: DC
  Administered 2015-08-03: 325 mg via ORAL
  Filled 2015-08-01: qty 1

## 2015-08-01 MED ORDER — PHENYLEPHRINE HCL 10 MG/ML IJ SOLN
0.0000 ug/min | INTRAVENOUS | Status: DC
  Filled 2015-08-01: qty 2

## 2015-08-01 MED ORDER — VECURONIUM BROMIDE 10 MG IV SOLR
INTRAVENOUS | Status: AC
  Filled 2015-08-01: qty 30

## 2015-08-01 MED ORDER — CALCIUM CHLORIDE 10 % IV SOLN
1.0000 g | Freq: Once | INTRAVENOUS | Status: AC
  Administered 2015-08-01: 1 g via INTRAVENOUS

## 2015-08-01 MED ORDER — MILRINONE IN DEXTROSE 20 MG/100ML IV SOLN
0.3000 ug/kg/min | INTRAVENOUS | Status: DC
  Administered 2015-08-02 – 2015-08-04 (×4): 0.3 ug/kg/min via INTRAVENOUS
  Filled 2015-08-01 (×5): qty 100

## 2015-08-01 MED ORDER — PHENYLEPHRINE HCL 10 MG/ML IJ SOLN
INTRAMUSCULAR | Status: DC | PRN
  Administered 2015-08-01: 80 ug via INTRAVENOUS
  Administered 2015-08-01: 120 ug via INTRAVENOUS

## 2015-08-01 MED ORDER — LACTATED RINGERS IV SOLN
INTRAVENOUS | Status: DC
  Administered 2015-08-04: 13:00:00 via INTRAVENOUS

## 2015-08-01 MED ORDER — GLYCOPYRROLATE 0.2 MG/ML IJ SOLN
INTRAMUSCULAR | Status: DC | PRN
  Administered 2015-08-01: 0.1 mg via INTRAVENOUS

## 2015-08-01 MED ORDER — NITROGLYCERIN IN D5W 200-5 MCG/ML-% IV SOLN: 7.0000 ug/min | INTRAVENOUS | Status: DC

## 2015-08-01 MED ORDER — POTASSIUM CHLORIDE 10 MEQ/50ML IV SOLN
10.0000 meq | INTRAVENOUS | Status: AC
  Administered 2015-08-01 (×3): 10 meq via INTRAVENOUS

## 2015-08-01 MED ORDER — LIDOCAINE HCL (CARDIAC) 20 MG/ML IV SOLN
INTRAVENOUS | Status: DC | PRN
  Administered 2015-08-01: 100 mg via INTRAVENOUS

## 2015-08-01 MED ORDER — MILRINONE IN DEXTROSE 20 MG/100ML IV SOLN
0.1250 ug/kg/min | INTRAVENOUS | Status: AC
  Administered 2015-08-01: .3 ug/kg/min via INTRAVENOUS
  Filled 2015-08-01: qty 100

## 2015-08-01 MED ORDER — ACETAMINOPHEN 500 MG PO TABS
1000.0000 mg | ORAL_TABLET | Freq: Four times a day (QID) | ORAL | Status: AC
  Administered 2015-08-03 – 2015-08-06 (×13): 1000 mg via ORAL
  Filled 2015-08-01 (×14): qty 2

## 2015-08-01 MED ORDER — GELATIN ABSORBABLE MT POWD
OROMUCOSAL | Status: DC | PRN
  Administered 2015-08-01 (×3): 4 mL via TOPICAL

## 2015-08-01 MED ORDER — AMIODARONE HCL IN DEXTROSE 360-4.14 MG/200ML-% IV SOLN
30.0000 mg/h | INTRAVENOUS | Status: DC
  Administered 2015-08-02 – 2015-08-03 (×4): 30 mg/h via INTRAVENOUS
  Filled 2015-08-01 (×4): qty 200

## 2015-08-01 MED ORDER — DEXMEDETOMIDINE HCL IN NACL 200 MCG/50ML IV SOLN
INTRAVENOUS | Status: AC
  Filled 2015-08-01: qty 50

## 2015-08-01 MED ORDER — PANTOPRAZOLE SODIUM 40 MG PO TBEC
40.0000 mg | DELAYED_RELEASE_TABLET | Freq: Every day | ORAL | Status: DC
  Administered 2015-08-03 – 2015-08-08 (×6): 40 mg via ORAL
  Filled 2015-08-01 (×6): qty 1

## 2015-08-01 MED ORDER — MORPHINE SULFATE (PF) 2 MG/ML IV SOLN
2.0000 mg | INTRAVENOUS | Status: DC | PRN
  Administered 2015-08-01 – 2015-08-02 (×3): 4 mg via INTRAVENOUS
  Administered 2015-08-02: 2 mg via INTRAVENOUS
  Administered 2015-08-02: 4 mg via INTRAVENOUS
  Administered 2015-08-02: 2 mg via INTRAVENOUS
  Administered 2015-08-02 (×5): 4 mg via INTRAVENOUS
  Administered 2015-08-02 – 2015-08-03 (×6): 2 mg via INTRAVENOUS
  Administered 2015-08-03 (×2): 4 mg via INTRAVENOUS
  Administered 2015-08-03 (×4): 2 mg via INTRAVENOUS
  Filled 2015-08-01: qty 2
  Filled 2015-08-01 (×3): qty 1
  Filled 2015-08-01: qty 2
  Filled 2015-08-01 (×2): qty 1
  Filled 2015-08-01: qty 2
  Filled 2015-08-01: qty 1
  Filled 2015-08-01 (×4): qty 2
  Filled 2015-08-01: qty 1
  Filled 2015-08-01 (×2): qty 2
  Filled 2015-08-01 (×2): qty 1
  Filled 2015-08-01 (×2): qty 2
  Filled 2015-08-01: qty 1
  Filled 2015-08-01: qty 2

## 2015-08-01 MED ORDER — SODIUM CHLORIDE 0.9 % IV SOLN
0.5000 g/h | Freq: Once | INTRAVENOUS | Status: AC
  Administered 2015-08-01: 1 g/h via INTRAVENOUS
  Filled 2015-08-01: qty 20

## 2015-08-01 MED ORDER — LIDOCAINE IN D5W 4-5 MG/ML-% IV SOLN: 1.0000 mg/min | INTRAVENOUS | Status: DC

## 2015-08-01 MED ORDER — NOREPINEPHRINE BITARTRATE 1 MG/ML IV SOLN
0.0000 ug/min | INTRAVENOUS | Status: AC
  Administered 2015-08-01: 4 ug/min via INTRAVENOUS
  Filled 2015-08-01: qty 4

## 2015-08-01 MED ORDER — MORPHINE SULFATE (PF) 2 MG/ML IV SOLN: 1.0000 mg | INTRAVENOUS | Status: AC | PRN

## 2015-08-01 MED ORDER — ROCURONIUM BROMIDE 100 MG/10ML IV SOLN
INTRAVENOUS | Status: DC | PRN
  Administered 2015-08-01: 50 mg via INTRAVENOUS

## 2015-08-01 SURGICAL SUPPLY — 149 items
ADAPTER CARDIO PERF ANTE/RETRO (ADAPTER) IMPLANT
APPLIER CLIP 9.375 MED OPEN (MISCELLANEOUS)
APPLIER CLIP 9.375 SM OPEN (CLIP) ×5
BAG DECANTER FOR FLEXI CONT (MISCELLANEOUS) ×5 IMPLANT
BALLN LINEAR 7.5FR IABP 40CC (BALLOONS) ×5
BALLOON LINEAR 7.5FR IABP 40CC (BALLOONS) ×3 IMPLANT
BANDAGE ACE 4X5 VEL STRL LF (GAUZE/BANDAGES/DRESSINGS) ×10 IMPLANT
BANDAGE ACE 6X5 VEL STRL LF (GAUZE/BANDAGES/DRESSINGS) ×5 IMPLANT
BANDAGE ELASTIC 4 VELCRO ST LF (GAUZE/BANDAGES/DRESSINGS) ×5 IMPLANT
BANDAGE ELASTIC 6 VELCRO ST LF (GAUZE/BANDAGES/DRESSINGS) ×5 IMPLANT
BLADE CORE FAN STRYKER (BLADE) ×10 IMPLANT
BLADE MINI 60D BLUE (BLADE) ×5 IMPLANT
BLADE SAW SAG 29X58X.64 (BLADE) ×5 IMPLANT
BLADE STERNUM SYSTEM 6 (BLADE) ×5 IMPLANT
BLADE SURG 11 STRL SS (BLADE) IMPLANT
BLADE SURG 12 STRL SS (BLADE) ×5 IMPLANT
BLADE SURG 15 STRL LF DISP TIS (BLADE) ×3 IMPLANT
BLADE SURG 15 STRL SS (BLADE) ×2
BLADE SURG ROTATE 9660 (MISCELLANEOUS) ×5 IMPLANT
BNDG GAUZE ELAST 4 BULKY (GAUZE/BANDAGES/DRESSINGS) ×10 IMPLANT
CANISTER SUCTION 2500CC (MISCELLANEOUS) ×5 IMPLANT
CANNULA EZ GLIDE AORTIC 21FR (CANNULA) ×5 IMPLANT
CANNULA GUNDRY RCSP 15FR (MISCELLANEOUS) IMPLANT
CATH RETROPLEGIA CORONARY 14FR (CATHETERS) IMPLANT
CATH ROBINSON RED A/P 18FR (CATHETERS) IMPLANT
CATH THORACIC 28FR (CATHETERS) ×5 IMPLANT
CATH THORACIC 28FR RT ANG (CATHETERS) ×5 IMPLANT
CATH THORACIC 36FR (CATHETERS) ×5 IMPLANT
CATH THORACIC 36FR RT ANG (CATHETERS) ×10 IMPLANT
CLIP APPLIE 9.375 MED OPEN (MISCELLANEOUS) IMPLANT
CLIP APPLIE 9.375 SM OPEN (CLIP) ×3 IMPLANT
CLIP FOGARTY SPRING 6M (CLIP) IMPLANT
CLIP RETRACTION 3.0MM CORONARY (MISCELLANEOUS) ×5 IMPLANT
CLIP TI MEDIUM 24 (CLIP) ×5 IMPLANT
CLIP TI WIDE RED SMALL 24 (CLIP) ×5 IMPLANT
CONN ST 1/4X3/8  BEN (MISCELLANEOUS) ×2
CONN ST 1/4X3/8 BEN (MISCELLANEOUS) ×3 IMPLANT
CONN Y 3/8X3/8X3/8  BEN (MISCELLANEOUS)
CONN Y 3/8X3/8X3/8 BEN (MISCELLANEOUS) IMPLANT
CONT SPEC 4OZ CLIKSEAL STRL BL (MISCELLANEOUS) ×5 IMPLANT
COVER MAYO STAND STRL (DRAPES) ×10 IMPLANT
COVER SURGICAL LIGHT HANDLE (MISCELLANEOUS) ×10 IMPLANT
CRADLE DONUT ADULT HEAD (MISCELLANEOUS) ×5 IMPLANT
DRAPE CARDIOVASCULAR INCISE (DRAPES) ×2
DRAPE EXTREMITY T 121X128X90 (DRAPE) ×5 IMPLANT
DRAPE PROXIMA HALF (DRAPES) IMPLANT
DRAPE SLUSH/WARMER DISC (DRAPES) IMPLANT
DRAPE SRG 135X102X78XABS (DRAPES) ×3 IMPLANT
DRSG AQUACEL AG ADV 3.5X14 (GAUZE/BANDAGES/DRESSINGS) ×5 IMPLANT
ELECT BLADE 6.5 EXT (BLADE) ×5 IMPLANT
ELECT CAUTERY BLADE 6.4 (BLADE) IMPLANT
ELECT REM PT RETURN 9FT ADLT (ELECTROSURGICAL) ×10
ELECTRODE REM PT RTRN 9FT ADLT (ELECTROSURGICAL) ×6 IMPLANT
GAUZE SPONGE 4X4 12PLY STRL (GAUZE/BANDAGES/DRESSINGS) ×10 IMPLANT
GAUZE XEROFORM 1X8 LF (GAUZE/BANDAGES/DRESSINGS) ×5 IMPLANT
GEL ULTRASOUND 20GR AQUASONIC (MISCELLANEOUS) ×5 IMPLANT
GLOVE BIO SURGEON STRL SZ 6 (GLOVE) ×30 IMPLANT
GLOVE BIO SURGEON STRL SZ 6.5 (GLOVE) ×16 IMPLANT
GLOVE BIO SURGEON STRL SZ7 (GLOVE) ×25 IMPLANT
GLOVE BIO SURGEON STRL SZ7.5 (GLOVE) ×20 IMPLANT
GLOVE BIO SURGEONS STRL SZ 6.5 (GLOVE) ×4
GLOVE BIOGEL PI IND STRL 6 (GLOVE) ×3 IMPLANT
GLOVE BIOGEL PI IND STRL 6.5 (GLOVE) ×3 IMPLANT
GLOVE BIOGEL PI IND STRL 7.0 (GLOVE) ×3 IMPLANT
GLOVE BIOGEL PI INDICATOR 6 (GLOVE) ×2
GLOVE BIOGEL PI INDICATOR 6.5 (GLOVE) ×2
GLOVE BIOGEL PI INDICATOR 7.0 (GLOVE) ×2
GLOVE EUDERMIC 7 POWDERFREE (GLOVE) ×5 IMPLANT
GOWN STRL REUS W/ TWL LRG LVL3 (GOWN DISPOSABLE) ×12 IMPLANT
GOWN STRL REUS W/TWL LRG LVL3 (GOWN DISPOSABLE) ×8
HARMONIC SHEARS 14CM COAG (MISCELLANEOUS) ×10 IMPLANT
HEMOSTAT POWDER SURGIFOAM 1G (HEMOSTASIS) IMPLANT
HEMOSTAT SURGICEL 2X14 (HEMOSTASIS) IMPLANT
INSERT FOGARTY 61MM (MISCELLANEOUS) ×5 IMPLANT
INSERT FOGARTY XLG (MISCELLANEOUS) IMPLANT
KIT BASIN OR (CUSTOM PROCEDURE TRAY) ×5 IMPLANT
KIT ROOM TURNOVER OR (KITS) ×5 IMPLANT
KIT SUCTION CATH 14FR (SUCTIONS) IMPLANT
KIT VASOVIEW W/TROCAR VH 2000 (KITS) ×5 IMPLANT
LOOP VESSEL MINI RED (MISCELLANEOUS) ×5 IMPLANT
MARKER GRAFT CORONARY BYPASS (MISCELLANEOUS) IMPLANT
NS IRRIG 1000ML POUR BTL (IV SOLUTION) ×35 IMPLANT
PACK OPEN HEART (CUSTOM PROCEDURE TRAY) ×5 IMPLANT
PAD ARMBOARD 7.5X6 YLW CONV (MISCELLANEOUS) ×10 IMPLANT
PAD DEFIB R2 (MISCELLANEOUS) IMPLANT
PAD ELECT DEFIB RADIOL ZOLL (MISCELLANEOUS) ×5 IMPLANT
PENCIL BUTTON HOLSTER BLD 10FT (ELECTRODE) IMPLANT
PUNCH AORTIC ROT 4.0MM RCL 40 (MISCELLANEOUS) ×5 IMPLANT
PUNCH AORTIC ROTATE 4.0MM (MISCELLANEOUS) IMPLANT
PUNCH AORTIC ROTATE 4.5MM 8IN (MISCELLANEOUS) IMPLANT
PUNCH AORTIC ROTATE 5MM 8IN (MISCELLANEOUS) IMPLANT
SENSOR MYOCARDIAL TEMP (MISCELLANEOUS) ×5 IMPLANT
SET CARDIOPLEGIA MPS 5001102 (MISCELLANEOUS) ×5 IMPLANT
SOLUTION ANTI FOG 6CC (MISCELLANEOUS) ×5 IMPLANT
SPONGE GAUZE 4X4 12PLY STER LF (GAUZE/BANDAGES/DRESSINGS) ×15 IMPLANT
SPONGE INTESTINAL PEANUT (DISPOSABLE) ×5 IMPLANT
SPONGE LAP 18X18 X RAY DECT (DISPOSABLE) ×50 IMPLANT
SPONGE LAP 4X18 X RAY DECT (DISPOSABLE) ×10 IMPLANT
STAPLER VISISTAT 35W (STAPLE) ×5 IMPLANT
SURGIFLO W/THROMBIN 8M KIT (HEMOSTASIS) ×10 IMPLANT
SUT BONE WAX W31G (SUTURE) ×5 IMPLANT
SUT ETHIBOND 2 0 SH (SUTURE) ×2
SUT ETHIBOND 2 0 SH 36X2 (SUTURE) ×3 IMPLANT
SUT MNCRL AB 4-0 PS2 18 (SUTURE) ×5 IMPLANT
SUT PROLENE 3 0 SH 1 (SUTURE) IMPLANT
SUT PROLENE 3 0 SH DA (SUTURE) ×10 IMPLANT
SUT PROLENE 3 0 SH1 36 (SUTURE) ×5 IMPLANT
SUT PROLENE 4 0 RB 1 (SUTURE) ×4
SUT PROLENE 4 0 SH DA (SUTURE) ×5 IMPLANT
SUT PROLENE 4-0 RB1 .5 CRCL 36 (SUTURE) ×6 IMPLANT
SUT PROLENE 5 0 C 1 36 (SUTURE) ×5 IMPLANT
SUT PROLENE 6 0 C 1 30 (SUTURE) ×55 IMPLANT
SUT PROLENE 6 0 CC (SUTURE) ×20 IMPLANT
SUT PROLENE 7 0 BV 1 (SUTURE) ×5 IMPLANT
SUT PROLENE 7 0 BV1 MDA (SUTURE) ×5 IMPLANT
SUT PROLENE 7 0 DA (SUTURE) IMPLANT
SUT PROLENE 8 0 BV175 6 (SUTURE) ×55 IMPLANT
SUT PROLENE BLUE 7 0 (SUTURE) ×20 IMPLANT
SUT PROLENE POLY MONO (SUTURE) ×5 IMPLANT
SUT SILK  1 MH (SUTURE) ×2
SUT SILK 1 MH (SUTURE) ×3 IMPLANT
SUT SILK 2 0 SH CR/8 (SUTURE) ×10 IMPLANT
SUT SILK 3 0 SH CR/8 (SUTURE) ×5 IMPLANT
SUT STEEL 5 V 56 M (SUTURE) ×5 IMPLANT
SUT STEEL 6MS V (SUTURE) ×5 IMPLANT
SUT STEEL STERNAL CCS#1 18IN (SUTURE) IMPLANT
SUT STEEL SZ 6 DBL 3X14 BALL (SUTURE) ×10 IMPLANT
SUT VIC AB 1 CTX 18 (SUTURE) ×15 IMPLANT
SUT VIC AB 1 CTX 36 (SUTURE) ×2
SUT VIC AB 1 CTX36XBRD ANBCTR (SUTURE) ×3 IMPLANT
SUT VIC AB 2-0 CT1 27 (SUTURE) ×4
SUT VIC AB 2-0 CT1 TAPERPNT 27 (SUTURE) ×6 IMPLANT
SUT VIC AB 2-0 CTX 27 (SUTURE) ×10 IMPLANT
SUT VIC AB 3-0 SH 27 (SUTURE) ×2
SUT VIC AB 3-0 SH 27X BRD (SUTURE) ×3 IMPLANT
SUT VIC AB 3-0 X1 27 (SUTURE) ×15 IMPLANT
SUTURE E-PAK OPEN HEART (SUTURE) ×5 IMPLANT
SYR 50ML SLIP (SYRINGE) IMPLANT
SYSTEM SAHARA CHEST DRAIN ATS (WOUND CARE) ×5 IMPLANT
TAPE CLOTH SURG 4X10 WHT LF (GAUZE/BANDAGES/DRESSINGS) ×5 IMPLANT
TAPE PAPER 2X10 WHT MICROPORE (GAUZE/BANDAGES/DRESSINGS) ×5 IMPLANT
TOWEL OR 17X24 6PK STRL BLUE (TOWEL DISPOSABLE) ×5 IMPLANT
TOWEL OR 17X26 10 PK STRL BLUE (TOWEL DISPOSABLE) ×5 IMPLANT
TRAY FOLEY IC TEMP SENS 16FR (CATHETERS) ×5 IMPLANT
TUBE SUCT INTRACARD DLP 20F (MISCELLANEOUS) ×5 IMPLANT
TUBE SUCTION CARDIAC 10FR (CANNULA) ×5 IMPLANT
TUBING INSUFFLATION (TUBING) ×5 IMPLANT
UNDERPAD 30X30 INCONTINENT (UNDERPADS AND DIAPERS) ×5 IMPLANT
WATER STERILE IRR 1000ML POUR (IV SOLUTION) ×10 IMPLANT

## 2015-08-01 NOTE — OR Nursing (Signed)
2nd call to SICU 1745

## 2015-08-01 NOTE — Progress Notes (Signed)
The patient was examined and preop studies reviewed. There has been no change from the prior exam and the patient is ready for surgery.   Plan redo CABG on [address] [place] with L radial artery harvest

## 2015-08-01 NOTE — Progress Notes (Signed)
  Echocardiogram Echocardiogram Transesophageal has been performed.  Donata Clay 08/01/2015, 11:17 AM

## 2015-08-01 NOTE — Anesthesia Postprocedure Evaluation (Signed)
Anesthesia Post Note  Patient: Manuel Bell  Procedure(s) Performed: Procedure(s) (LRB): REDO CORONARY ARTERY BYPASS GRAFTING x two (CABG) x   using left radial artery, and left leg greater saphenous vein harvested endoscopically, also performed coronary endarderectomy. Placement of Intra aortic balloon pump (IAB). (N/A) RADIAL ARTERY HARVEST (Left) TRANSESOPHAGEAL ECHOCARDIOGRAM (TEE) (N/A)  Patient location during evaluation: SICU Anesthesia Type: General Level of consciousness: sedated Pain management: pain level controlled Vital Signs Assessment: post-procedure vital signs reviewed and stable Respiratory status: patient remains intubated per anesthesia plan Cardiovascular status: stable Anesthetic complications: no    Last Vitals:  Filed Vitals:   08/01/15 2015 08/01/15 2030  BP:    Pulse: 95 101  Temp: 35.9 C 35.9 C  Resp: 15 12    Last Pain: There were no vitals filed for this visit.               Hareem Surowiec DANIEL

## 2015-08-01 NOTE — Brief Op Note (Addendum)
08/01/2015  3:33 PM      [address]       [place] [address]             [phone]     08/01/2015  3:34 PM  PATIENT:  Manuel Bell  60 y.o. male  PRE-OPERATIVE DIAGNOSIS:  CAD  POST-OPERATIVE DIAGNOSIS:  CAD  PROCEDURE:  Procedure(s): REDO CORONARY ARTERY BYPASS GRAFTING x 2 (LEFT RADIAL-OM; SVG-PL)  using left radial artery, and left leg greater saphenous vein harvested endoscopically, CORONARY  endarderectomy(OM) RADIAL ARTERY HARVEST Placement of intra-aortic balloon pump  TRANSESOPHAGEAL ECHOCARDIOGRAM (TEE)  SURGEON:  Surgeon(s): Ivin Poot, MD  PHYSICIAN ASSISTANT: WAYNE GOLD PA-C  ANESTHESIA:   general  PATIENT CONDITION:  ICU - intubated and hemodynamically stable.  PRE-OPERATIVE WEIGHT: [id number]  COMPLICATIONS: NO KNOWN

## 2015-08-01 NOTE — Transfer of Care (Signed)
Immediate Anesthesia Transfer of Care Note  Patient: Manuel Bell  Procedure(s) Performed: Procedure(s): REDO CORONARY ARTERY BYPASS GRAFTING x two (CABG) x   using left radial artery, and left leg greater saphenous vein harvested endoscopically, also performed coronary endarderectomy. Placement of Intra aortic balloon pump (IAB). (N/A) RADIAL ARTERY HARVEST (Left) TRANSESOPHAGEAL ECHOCARDIOGRAM (TEE) (N/A)  Patient Location: SICU  Anesthesia Type:General  Level of Consciousness: sedated, unresponsive and Patient remains intubated per anesthesia plan  Airway & Oxygen Therapy: Patient remains intubated per anesthesia plan and Patient placed on Ventilator (see vital sign flow sheet for setting)  Post-op Assessment: Report given to RN and Post -op Vital signs reviewed and stable  Post vital signs: Reviewed and stable  Last Vitals:  Filed Vitals:   08/01/15 0552  BP: 136/83  Pulse: 66  Temp: 36.9 C  Resp: 20    Complications: No apparent anesthesia complications

## 2015-08-01 NOTE — Anesthesia Procedure Notes (Addendum)
Procedure Name: Intubation Date/Time: 08/01/2015 7:52 AM Performed by: Julian Reil Pre-anesthesia Checklist: Patient identified, Emergency Drugs available, Suction available, Patient being monitored and Timeout performed Patient Re-evaluated:Patient Re-evaluated prior to inductionOxygen Delivery Method: Circle system utilized Preoxygenation: Pre-oxygenation with 100% oxygen Intubation Type: IV induction Ventilation: Mask ventilation without difficulty Laryngoscope Size: Miller and 2 Grade View: Grade I Tube type: Oral Tube size: 8.0 mm Number of attempts: 1 Airway Equipment and Method: Stylet Placement Confirmation: ETT inserted through vocal cords under direct vision,  breath sounds checked- equal and bilateral and positive ETCO2 Secured at: 22 cm Tube secured with: Tape Dental Injury: Teeth and Oropharynx as per pre-operative assessment     Procedures: Right IJ Gordy Councilman Catheter Insertion: RIJ [id number]: The patient was identified and consent obtained.  TO was performed, and full barrier precautions were used.  The skin was anesthetized with lidocaine-4cc plain with 25g needle.  Once the vein was located with the 22 ga. needle using ultrasound guidance , the wire was inserted into the vein.  The wire location was confirmed with ultrasound.  The tissue was dilated and the 8.5 [place] cordis catheter was carefully inserted. Afterwards Gordy Councilman catheter was inserted. PA catheter at 48cm.  The patient tolerated the procedure well.

## 2015-08-01 NOTE — OR Nursing (Signed)
Maquet intra-aortic balloon pump exp. 2019.  Lot# [id number]

## 2015-08-01 NOTE — Op Note (Signed)
NAME:  Manuel Bell, Manuel Bell NO.:  [id number]  MEDICAL RECORD NO.:  [id number]  LOCATION:  [place]                       FACILITY:  [facility]  PHYSICIAN:  Ivin Poot, M.D.  DATE OF BIRTH:  1956-02-12  DATE OF PROCEDURE:  08/01/2015 DATE OF DISCHARGE:                              OPERATIVE REPORT   OPERATION: 1. Redo coronary artery bypass graft x2 (left radial artery free graft     to ramus intermedius, saphenous vein graft to circumflex     posterolateral). 2. Endoscopic harvest of left leg greater saphenous vein. 3. Harvest of left radial free graft. 4. Coronary endarterectomy of ramus intermedius. 5. Placement of intra-aortic balloon pump, right femoral artery.  PREOPERATIVE DIAGNOSIS:  Recurrent severe coronary artery disease with suboptimal targets for grafting.  POSTOPERATIVE DIAGNOSIS:  Recurrent severe coronary artery disease with suboptimal targets for grafting.  SURGEON:  Ivin Poot, M.D.  ASSISTANT:  John Giovanni, P.A.-C.  ANESTHESIA:  General by Dr. Tobias Alexander.  INDICATIONS:  The patient is a 60 year old.  Four years ago, he had multivessel CABG.  He returned with symptoms of exertional angina, which occurred recently and cardiac catheterization demonstrated patent left IMA to the LAD, patent vein graft to the diagonal, occluded sequential vein graft to the ramus and distal circumflex and an occluded vein graft to a small distal branch of the RCA.  Ejection fraction was fairly well preserved.  He was not felt to be a candidate for percutaneous intervention due to his severely calcified disease.  He was recommended for high-risk redo CABG.  I examined the patient in the office and reviewed the results of his cardiac catheterization with the patient and his family.  I discussed the indications and expected benefits of high- risk redo CABG for treatment of his recurrent coronary artery disease. He understood that redo surgery was increased risk and  would be much longer procedure with longer recovery than his original surgery.  We discussed the plan to use radial graft and endoscopically harvested vein to graft the ramus intermedius and the distal circumflex.  We discussed the use of general anesthesia and cardiopulmonary bypass, location of the surgical incisions, and the expected postoperative hospital recovery.  I discussed with him the risks of redo CABG, surgery including risks of bleeding, blood transfusion, stroke, MI, postoperative pulmonary problems including pleural effusion, and death. After reviewing these issues, he demonstrated his understanding and agreed to proceed with surgery under what I felt was an informed consent.  OPERATIVE FINDINGS: 1. Adequate conduit, although the distal vein was too small to use. 2. Severely diseased ramus intermedius with heavy calcification, which     required coronary endarterectomy, but with a vessel that was     intramyocardial and difficult target for grafting. 3. A 1.2-mm posterolateral branch of the distal circumflex, is small,     but adequate target for grafting. 4. After prolonged pump run, the patient initially separated from     cardiopulmonary bypass, but then showed evidence of decreased blood     pressure and increasing filling pressures and a balloon pump was     placed.  Dictation ended at this point.     Tharon Aquas  Kerby Less, M.D.     PV/MEDQ  D:  08/01/2015  T:  08/01/2015  Job:  [id number]  cc:   Lauree Chandler, MD

## 2015-08-01 NOTE — Progress Notes (Deleted)
  Echocardiogram 2D Echocardiogram has been performed.  Donata Clay 08/01/2015, 10:38 AM

## 2015-08-01 NOTE — Progress Notes (Signed)
Dr. Prescott Gum at bedside for update on pt.  Noted that swan was at 45cm.  Per Dr. Dianna Limbo advanced to 50cm.  Pt tolerated with no issues.  Other orders received.  Will continue to monitor pt closely.

## 2015-08-02 ENCOUNTER — Inpatient Hospital Stay (HOSPITAL_COMMUNITY): Payer: BLUE CROSS/BLUE SHIELD

## 2015-08-02 ENCOUNTER — Other Ambulatory Visit: Payer: Self-pay

## 2015-08-02 ENCOUNTER — Encounter (HOSPITAL_COMMUNITY): Payer: Self-pay | Admitting: Cardiothoracic Surgery

## 2015-08-02 LAB — BASIC METABOLIC PANEL
ANION GAP: 6 (ref 5–15)
BUN: 7 mg/dL (ref 6–20)
CHLORIDE: 108 mmol/L (ref 101–111)
CO2: 26 mmol/L (ref 22–32)
Calcium: 7.8 mg/dL — ABNORMAL LOW (ref 8.9–10.3)
Creatinine, Ser: 0.83 mg/dL (ref 0.61–1.24)
GFR calc Af Amer: >60 mL/min (ref >60)
GFR calc non Af Amer: >60 mL/min (ref >60)
GLUCOSE: 101 mg/dL — AB (ref 65–99)
POTASSIUM: 3.2 mmol/L — AB (ref 3.5–5.1)
Sodium: 140 mmol/L (ref 135–145)

## 2015-08-02 LAB — POCT I-STAT 3, ART BLOOD GAS (G3+)
Acid-Base Excess: 1 mmol/L (ref 0.0–2.0)
Acid-Base Excess: 1 mmol/L (ref 0.0–2.0)
Bicarbonate: 25.6 mEq/L — ABNORMAL HIGH (ref 20.0–24.0)
Bicarbonate: 25.7 mEq/L — ABNORMAL HIGH (ref 20.0–24.0)
O2 Saturation: 100 %
O2 Saturation: 98 %
Patient temperature: 36.6
Patient temperature: 36.8
TCO2: 27 mmol/L (ref 0–100)
TCO2: 27 mmol/L (ref 0–100)
pCO2 arterial: 39 mmHg (ref 35.0–45.0)
pCO2 arterial: 37.4 mmHg (ref 35.0–45.0)
pH, Arterial: 7.424 (ref 7.350–7.450)
pH, Arterial: 7.444 (ref 7.350–7.450)
pO2, Arterial: 170 mmHg — ABNORMAL HIGH (ref 80.0–100.0)
pO2, Arterial: 102 mmHg — ABNORMAL HIGH (ref 80.0–100.0)

## 2015-08-02 LAB — POCT I-STAT, CHEM 8
BUN: 7 mg/dL (ref 6–20)
BUN: 7 mg/dL (ref 6–20)
Calcium, Ion: 1.17 mmol/L (ref 1.13–1.30)
Calcium, Ion: 1.11 mmol/L — ABNORMAL LOW (ref 1.13–1.30)
Chloride: 101 mmol/L (ref 101–111)
Chloride: 99 mmol/L — ABNORMAL LOW (ref 101–111)
Creatinine, Ser: 0.6 mg/dL — ABNORMAL LOW (ref 0.61–1.24)
Creatinine, Ser: 0.6 mg/dL — ABNORMAL LOW (ref 0.61–1.24)
Glucose, Bld: 99 mg/dL (ref 65–99)
Glucose, Bld: 179 mg/dL — ABNORMAL HIGH (ref 65–99)
HCT: 18 % — ABNORMAL LOW (ref 39.0–52.0)
HCT: 23 % — ABNORMAL LOW (ref 39.0–52.0)
Hemoglobin: 6.1 g/dL — CL (ref 13.0–17.0)
Hemoglobin: 7.8 g/dL — ABNORMAL LOW (ref 13.0–17.0)
Potassium: 3.1 mmol/L — ABNORMAL LOW (ref 3.5–5.1)
Potassium: 3.9 mmol/L (ref 3.5–5.1)
Sodium: 141 mmol/L (ref 135–145)
Sodium: 136 mmol/L (ref 135–145)
TCO2: 26 mmol/L (ref 0–100)
TCO2: 24 mmol/L (ref 0–100)

## 2015-08-02 LAB — CBC
HCT: 18.1 % — ABNORMAL LOW (ref 39.0–52.0)
HEMATOCRIT: 23.3 % — AB (ref 39.0–52.0)
HEMOGLOBIN: 8.4 g/dL — AB (ref 13.0–17.0)
Hemoglobin: 6.3 g/dL — CL (ref 13.0–17.0)
MCH: 31.3 pg (ref 26.0–34.0)
MCH: 31.8 pg (ref 26.0–34.0)
MCHC: 34.8 g/dL (ref 30.0–36.0)
MCHC: 36.1 g/dL — AB (ref 30.0–36.0)
MCV: 90 fL (ref 78.0–100.0)
MCV: 88.3 fL (ref 78.0–100.0)
PLATELETS: 75 10*3/uL — AB (ref 150–400)
Platelets: 49 10*3/uL — ABNORMAL LOW (ref 150–400)
RBC: 2.01 MIL/uL — ABNORMAL LOW (ref 4.22–5.81)
RBC: 2.64 MIL/uL — AB (ref 4.22–5.81)
RDW: 13.9 % (ref 11.5–15.5)
RDW: 15.1 % (ref 11.5–15.5)
WBC: 6.5 10*3/uL (ref 4.0–10.5)
WBC: 6.8 10*3/uL (ref 4.0–10.5)

## 2015-08-02 LAB — BLOOD GAS, ARTERIAL

## 2015-08-02 LAB — GLUCOSE, CAPILLARY
Glucose-Capillary: 101 mg/dL — ABNORMAL HIGH (ref 65–99)
Glucose-Capillary: 103 mg/dL — ABNORMAL HIGH (ref 65–99)
Glucose-Capillary: 157 mg/dL — ABNORMAL HIGH (ref 65–99)
Glucose-Capillary: 187 mg/dL — ABNORMAL HIGH (ref 65–99)
Glucose-Capillary: 95 mg/dL (ref 65–99)
Glucose-Capillary: 96 mg/dL (ref 65–99)
Glucose-Capillary: 99 mg/dL (ref 65–99)
Glucose-Capillary: 174 mg/dL — ABNORMAL HIGH (ref 65–99)
Glucose-Capillary: 155 mg/dL — ABNORMAL HIGH (ref 65–99)

## 2015-08-02 LAB — PREPARE FRESH FROZEN PLASMA
Unit division: 0
Unit division: 0
Unit division: 0
Unit division: 0

## 2015-08-02 LAB — CREATININE, SERUM: CREATININE: 0.81 mg/dL (ref 0.61–1.24)

## 2015-08-02 LAB — PREPARE PLATELET PHERESIS
Unit division: 0
Unit division: 0

## 2015-08-02 LAB — MAGNESIUM
MAGNESIUM: 2.5 mg/dL — AB (ref 1.7–2.4)
MAGNESIUM: 2.2 mg/dL (ref 1.7–2.4)

## 2015-08-02 LAB — CARBOXYHEMOGLOBIN
Carboxyhemoglobin: 0.9 % (ref 0.5–1.5)
Carboxyhemoglobin: 0.9 % (ref 0.5–1.5)
Methemoglobin: 1.4 % (ref 0.0–1.5)
Methemoglobin: 0.9 % (ref 0.0–1.5)
O2 Saturation: 53.5 %
O2 Saturation: 60.7 %
Total hemoglobin: 6.9 g/dL — CL (ref 13.5–18.0)
Total hemoglobin: 13 g/dL — ABNORMAL LOW (ref 13.5–18.0)

## 2015-08-02 LAB — PREPARE CRYOPRECIPITATE: Unit division: 0

## 2015-08-02 LAB — PREPARE RBC (CROSSMATCH)

## 2015-08-02 LAB — APTT: aPTT: 34 seconds (ref 24–37)

## 2015-08-02 LAB — PROTIME-INR
INR: 1.27 (ref 0.00–1.49)
Prothrombin Time: 16.1 seconds — ABNORMAL HIGH (ref 11.6–15.2)

## 2015-08-02 MED ORDER — VANCOMYCIN HCL IN DEXTROSE 1-5 GM/200ML-% IV SOLN
1000.0000 mg | Freq: Two times a day (BID) | INTRAVENOUS | Status: AC
  Administered 2015-08-02 (×2): 1000 mg via INTRAVENOUS
  Filled 2015-08-02 (×2): qty 200

## 2015-08-02 MED ORDER — INSULIN ASPART 100 UNIT/ML ~~LOC~~ SOLN
0.0000 [IU] | SUBCUTANEOUS | Status: DC
  Administered 2015-08-02: 2 [IU] via SUBCUTANEOUS

## 2015-08-02 MED ORDER — INSULIN GLARGINE 100 UNIT/ML ~~LOC~~ SOLN
15.0000 [IU] | Freq: Every day | SUBCUTANEOUS | Status: DC
  Administered 2015-08-02 – 2015-08-05 (×4): 15 [IU] via SUBCUTANEOUS
  Filled 2015-08-02 (×5): qty 0.15

## 2015-08-02 MED ORDER — CHLORHEXIDINE GLUCONATE CLOTH 2 % EX PADS
6.0000 | MEDICATED_PAD | Freq: Every day | CUTANEOUS | Status: DC
  Administered 2015-08-02 – 2015-08-06 (×4): 6 via TOPICAL

## 2015-08-02 MED ORDER — METOCLOPRAMIDE HCL 5 MG/ML IJ SOLN
10.0000 mg | Freq: Four times a day (QID) | INTRAMUSCULAR | Status: DC
  Administered 2015-08-02 – 2015-08-06 (×15): 10 mg via INTRAVENOUS
  Filled 2015-08-02 (×15): qty 2

## 2015-08-02 MED ORDER — ANTISEPTIC ORAL RINSE SOLUTION (CORINZ)
7.0000 mL | Freq: Four times a day (QID) | OROMUCOSAL | Status: DC
  Administered 2015-08-02: 7 mL via OROMUCOSAL

## 2015-08-02 MED ORDER — FUROSEMIDE 10 MG/ML IJ SOLN
20.0000 mg | Freq: Two times a day (BID) | INTRAMUSCULAR | Status: DC
  Administered 2015-08-02 – 2015-08-03 (×3): 20 mg via INTRAVENOUS
  Filled 2015-08-02 (×3): qty 2

## 2015-08-02 MED ORDER — POTASSIUM CHLORIDE 10 MEQ/50ML IV SOLN
10.0000 meq | INTRAVENOUS | Status: AC
  Administered 2015-08-02 (×3): 10 meq via INTRAVENOUS

## 2015-08-02 MED ORDER — CHLORHEXIDINE GLUCONATE 0.12% ORAL RINSE (MEDLINE KIT)
15.0000 mL | Freq: Two times a day (BID) | OROMUCOSAL | Status: DC
  Administered 2015-08-02: 15 mL via OROMUCOSAL

## 2015-08-02 MED ORDER — POTASSIUM CHLORIDE 10 MEQ/50ML IV SOLN
10.0000 meq | INTRAVENOUS | Status: AC
  Administered 2015-08-02 (×2): 10 meq via INTRAVENOUS
  Filled 2015-08-02 (×2): qty 50

## 2015-08-02 MED ORDER — ISOSORBIDE MONONITRATE ER 30 MG PO TB24
15.0000 mg | ORAL_TABLET | Freq: Every day | ORAL | Status: DC
  Administered 2015-08-02 – 2015-08-08 (×7): 15 mg via ORAL
  Filled 2015-08-02 (×7): qty 1

## 2015-08-02 MED ORDER — INSULIN ASPART 100 UNIT/ML ~~LOC~~ SOLN
0.0000 [IU] | SUBCUTANEOUS | Status: DC
  Administered 2015-08-02 (×2): 4 [IU] via SUBCUTANEOUS
  Administered 2015-08-02 – 2015-08-03 (×3): 2 [IU] via SUBCUTANEOUS
  Administered 2015-08-03: 4 [IU] via SUBCUTANEOUS
  Administered 2015-08-03 – 2015-08-05 (×7): 2 [IU] via SUBCUTANEOUS

## 2015-08-02 MED ORDER — MUPIROCIN 2 % EX OINT
1.0000 "application " | TOPICAL_OINTMENT | Freq: Two times a day (BID) | CUTANEOUS | Status: DC
  Administered 2015-08-02 – 2015-08-06 (×9): 1 via NASAL
  Filled 2015-08-02: qty 22

## 2015-08-02 MED ORDER — CETYLPYRIDINIUM CHLORIDE 0.05 % MT LIQD
7.0000 mL | Freq: Two times a day (BID) | OROMUCOSAL | Status: DC
  Administered 2015-08-02 – 2015-08-06 (×4): 7 mL via OROMUCOSAL

## 2015-08-02 MED ORDER — INSULIN ASPART 100 UNIT/ML ~~LOC~~ SOLN
4.0000 [IU] | Freq: Three times a day (TID) | SUBCUTANEOUS | Status: DC
  Administered 2015-08-02 – 2015-08-05 (×8): 4 [IU] via SUBCUTANEOUS

## 2015-08-02 MED FILL — Mannitol IV Soln 20%: INTRAVENOUS | Qty: 500 | Status: AC

## 2015-08-02 MED FILL — Electrolyte-R (PH 7.4) Solution: INTRAVENOUS | Qty: 6000 | Status: AC

## 2015-08-02 MED FILL — Calcium Chloride Inj 10%: INTRAVENOUS | Qty: 10 | Status: AC

## 2015-08-02 MED FILL — Heparin Sodium (Porcine) Inj 1000 Unit/ML: INTRAMUSCULAR | Qty: 10 | Status: AC

## 2015-08-02 MED FILL — Electrolyte-R (PH 7.4) Solution: INTRAVENOUS | Qty: 7000 | Status: AC

## 2015-08-02 MED FILL — Sodium Chloride IV Soln 0.9%: INTRAVENOUS | Qty: 3000 | Status: AC

## 2015-08-02 MED FILL — Lidocaine HCl IV Inj 20 MG/ML: INTRAVENOUS | Qty: 5 | Status: AC

## 2015-08-02 NOTE — Progress Notes (Signed)
1 Day Post-Op Procedure(s) (LRB): REDO CORONARY ARTERY BYPASS GRAFTING x two (CABG) x   using left radial artery, and left leg greater saphenous vein harvested endoscopically, also performed coronary endarderectomy. Placement of Intra aortic balloon pump (IAB). (N/A) RADIAL ARTERY HARVEST (Left) TRANSESOPHAGEAL ECHOCARDIOGRAM (TEE) (N/A) Subjective: Redo CABG , coronary endarterectomy Postop coagulopathy IABP 1:1 A-paced on iv amio Acute expected postop anemia- 2 units PRBCs today Objective: Vital signs in last 24 hours: Temp:  [96.3 F (35.7 C)-98.2 F (36.8 C)] 98.1 F (36.7 C) (02/08 0830) Pulse Rate:  [89-101] 90 (02/08 0830) Cardiac Rhythm:  [-] Atrial paced (02/08 0800) Resp:  [12-24] 24 (02/08 0830) BP: (87-116)/(39-67) 91/67 mmHg (02/08 0800) SpO2:  [10 %-100 %] 100 % (02/08 0830) Arterial Line BP: (74-118)/(34-60) 101/48 mmHg (02/08 0830) FiO2 (%):  [50 %] 50 % (02/08 0800) Weight:  [206 lb 2.1 oz (93.5 kg)] 206 lb 2.1 oz (93.5 kg) (02/08 0600)  Hemodynamic parameters for last 24 hours: PAP: (21-42)/(11-27) 41/26 mmHg CO:  [3.5 L/min-5.9 L/min] 5.9 L/min CI:  [1.8 L/min/m2-3 L/min/m2] 3 L/min/m2  Intake/Output from previous day: 02/07 0701 - 02/08 0700 In: 8467.4 [I.V.:4855.4; Blood:1952; NG/GT:210; IV Piggyback:1450] Out: 10600 [Urine:7420; Emesis/NG output:150; Blood:2200; Chest Tube:830] Intake/Output this shift: Total I/O In: 171.1 [I.V.:141.1; NG/GT:30] Out: 150 [Urine:50; Emesis/NG output:100]       Exam    General- alert and comfortable, on vent   Lungs- clear without rales, wheezes   Cor- regular rate and rhythm, no murmur , gallop   Abdomen- soft, non-tender   Extremities - warm, non-tender, minimal edema, R foot warm with IABP   Neuro-  Intermittently oriented, appropriate, no focal weakness   Lab Results:  Recent Labs  08/01/15 1929 08/02/15 0355 08/02/15 0403  WBC 8.6  --  6.5  HGB 9.9* 6.1* 6.3*  HCT 28.8* 18.0* 18.1*  PLT 126*  --   75*   BMET:  Recent Labs  08/02/15 0355 08/02/15 0403  NA 141 140  K 3.1* 3.2*  CL 101 108  CO2  --  26  GLUCOSE 99 101*  BUN 7 7  CREATININE 0.60* 0.83  CALCIUM  --  7.8*    PT/INR:  Recent Labs  08/01/15 1929  LABPROT 18.8*  INR 1.57*   ABG    Component Value Date/Time   PHART 7.424 08/02/2015 0405   HCO3 25.6* 08/02/2015 0405   TCO2 27 08/02/2015 0405   ACIDBASEDEF 2.0 08/01/2015 1915   O2SAT 53.5 08/02/2015 0840   CBG (last 3)   Recent Labs  08/01/15 2158 08/01/15 2256 08/01/15 2352  GLUCAP 139* 137* 113*    Assessment/Plan: S/P Procedure(s) (LRB): REDO CORONARY ARTERY BYPASS GRAFTING x two (CABG) x   using left radial artery, and left leg greater saphenous vein harvested endoscopically, also performed coronary endarderectomy. Placement of Intra aortic balloon pump (IAB). (N/A) RADIAL ARTERY HARVEST (Left) TRANSESOPHAGEAL ECHOCARDIOGRAM (TEE) (N/A) Extubate Transfuse Leave IABP until am tomorrow   LOS: 1 day    Tharon Aquas Trigt III 08/02/2015

## 2015-08-02 NOTE — Progress Notes (Signed)
UR Completed. Leory Allinson, RN, BSN.  [phone] 

## 2015-08-02 NOTE — Progress Notes (Signed)
CRITICAL VALUE ALERT  Critical value received: Serum Hgb: 6.3, Istat Hgb: 6.1  Date of notification:  08/02/15  Time of notification:  [id number]  Critical value read back:Yes.    Nurse who received alert:  Archie Endo, RN  Per protocol, surgeon not notified at this time.  Pt is not symptomatic at this time.  Surgeon will be made aware upon rounds.  Will continue to monitor pt closely.

## 2015-08-02 NOTE — Procedures (Signed)
Extubation Procedure Note  Patient Details:   Name: Manuel Bell DOB: 05/23/1956 MRN: [id number]   Airway Documentation:    Positive cuff leak test, NIF and VC. On 2L [place]. RN at bedside.  Evaluation  O2 sats: stable throughout Complications: No apparent complications Patient did tolerate procedure well. Bilateral Breath Sounds: Clear, Diminished   Yes  Manuel Bell 08/02/2015, 11:21 AM

## 2015-08-02 NOTE — Op Note (Signed)
NAME:  Manuel Bell, Manuel Bell.:  [id number]  MEDICAL RECORD NO.:  [id number]  LOCATION:  [place]                       FACILITY:  [facility]  PHYSICIAN:  Ivin Poot, M.D.  DATE OF BIRTH:  May 21, 1956  DATE OF PROCEDURE:  08/01/2015 DATE OF DISCHARGE:                              OPERATIVE REPORT   OPERATION: 1. Redo coronary artery bypass graft x2 (left radial artery free graft     to ramus intermediate, saphenous vein graft to posterolateral     branch of the distal circumflex). 2. Endoscopic harvest of left leg greater saphenous vein. 3. Left radial artery harvest. 4. Coronary endarterectomy of ramus intermediate coronary artery. 5. Placement of transfemoral intra-aortic balloon pump-right femoral     artery.  SURGEON:  Ivin Poot, MD  ASSISTANT:  John Giovanni, PA-C.  PREOPERATIVE DIAGNOSES:  Recurrent angina after previous multivessel coronary artery bypass graft with severe recurrent calcified disease, poor targets.  POSTOPERATIVE DIAGNOSES:  Recurrent angina after previous multivessel coronary artery bypass graft with severe recurrent calcified disease, poor targets.  ANESTHESIA:  General by Nelda Severe Tobias Alexander, MD  INDICATIONS:  The patient is a 60 year old, male who 4 years ago underwent multivessel CABG for symptoms of unstable angina.  He did well.  Recently he developed recurrent exertional chest pains and underwent cardiac catheterization by Dr. Angelena Form, which demonstrated recurrent disease with patent left IMA graft to the LAD, patent vein graft to the diagonal, occluded sequential saphenous vein graft to the circumflex vessels and a native right coronary with minimal disease.  LV function was fairly well preserved.  He was not felt to be a candidate for percutaneous intervention because of his severely calcified vessels and redo CABG was recommended.  I examined the patient in the office and reviewed the results of the cardiac catheterization  with the patient and his family.  I discussed the indications, expected benefits of high risk redo CABG.  I discussed the plan to use radial artery graft and an endoscopically harvest vein to graft the circumflex vessels.  I discussed with the patient, the risk to him and he understood that redo bypass surgery was at significantly greater risk than primary surgery and would take longer and would require greater recovery.  I discussed the major aspects of surgery including the use of general anesthesia and cardiopulmonary bypass, the location of the surgical incisions, and the expected postoperative recovery.  I discussed with the patient the risks to him of redo, high-risk for CABG including risks of bleeding, blood transfusion, stroke, MI, postoperative pulmonary problems including pleural effusion, infection, and death.  After reviewing these issues, he demonstrated his understanding and agreed to proceed with surgery under what I felt was an informed consent.  OPERATIVE FINDINGS: 1. Adequate conduit, but the vein was small distally, but was not     used. 2. Poor targets for grafting with the heavily calcified ramus     intermediate which was also intramyocardial and required     endarterectomy.  The posterolateral branch of the circumflex was a     1.2-mm vessel small, but adequate for grafting. 3. Some global LV hypokinesia after long pump run, necessitating  balloon pump to separate from cardiopulmonary bypass.  OPERATIVE PROCEDURE:  The patient was brought to the operating room, placed supine on the operating table.  General anesthesia was induced under invasive hemodynamic monitoring.  A transesophageal echo probe was placed by the anesthesiologist.  The left arm, chest, abdomen, and legs were prepped with Betadine and draped as a sterile field.  A proper time- out was performed.  A left arm incision was made and the radial artery was harvested as a free graft.  Before  harvesting, the distal palmar arch pulse was documented when clamping the radial artery with a vascular bulldog.  The radial artery graft was removed and placed in a papaverine-heparin solution and the incision was closed per routine. The arm was then tucked to the left side.  A sternal incision was made as the radial artery was being harvested. The oscillating saw was used to divide the sternal bone without injury to the underlying vascular structures.  The anterior mediastinal adhesions were dense.  These were carefully dissected to expose the right atrium, right ventricle, ascending aorta, and the patent vein graft to the diagonal.  Next, a left chest wall elevating retractor was used to further dissect the lateral part of the heart and to expose the patent mammary artery pedicle coming through the pericardium which was isolated with a vessel loop.  The chest wall elevating retractor was replaced with a sternal retractor.  Further dissection was performed and the pericardium was suspended.  Pursestrings were placed in the ascending aorta and right atrium and heparin was administered after the vein was harvested from the left leg endoscopically.  The patient was cannulated and placed on cardiopulmonary bypass.  The heart had severe adhesions, but the mammary artery and patent saphenous vein grafts were protected.  Cardioplegic cannulas were placed both antegrade and retrograde cold blood cardioplegia and A-line for CO2 insufflation in the surgical field was placed.  The aorta was dissected from the pulmonary artery to allow placement of cross-clamp.  The cross-clamp was then placed and hypothermic cardioplegic arrest was achieved using a L of cold blood cardioplegia in split doses between the antegrade aortic and retrograde coronary sinus catheters.  The mammary artery pedicle was clamped with a soft vascular bulldog during cardioplegic arrest.  After the heart was rested, the  dissection posteriorly was completed to mobilize the heart.  The sequential vein graft to the circumflex vessel was identified high in the AV groove.  With great difficulty, the ramus intermediate branch was identified.  It was heavily calcified.  It was dissected further distally in order to find an adequate place for the anastomosis.  However, the vessel stayed fairly calcified.  Cardioplegia was redosed every 20-30 minutes.  The heart was positioned and an arteriotomy was made in the ramus intermediate branch.  There was a posterior plaque.  The anastomosis using the radial artery and a running 8-0 Prolene was initiated however by halfway around the anastomosis, the suture could not be passed through the vessel wall because of the calcified plaque.  The anastomosis was then taken down and a formal endarterectomy of the ramus intermediate was performed.  This removed a heavily calcified plaque with fairly intact median adventitia.  The radial artery was then sewn to the artery with the arteriotomy extended somewhat distally where the vessel had become intramyocardial.  The end-to-side anastomosis with 8-0 Prolene was performed.  This was a check to cardioplegia.  The dissection of the vessel intramyocardially had some bleeding points  which were controlled with some 6-0 Prolene sutures to the sides of the vessel anastomosis.  Next, the heart was further mobilized and the posterolateral branch was identified after a dose of cardioplegia.  The posterior lateral branch was opened and it was 1.2-mm vessel.  The reverse saphenous vein was sewn end-to-side with running 7-0 Prolene.  There was good flow through this anastomosis.  Cardioplegia was redosed.  The vein graft and the radial artery graft were then brought beneath the mammary artery pedicle for placement on the ascending aorta.  The radial artery was sewn as a free graft to the proximal portion of the vein graft.  Vein graft was  sewn to the ascending aorta, had an old vein graft origin using a 4.0 mm punch and running 6-0 Prolene.  Air was vented from the coronaries with a dose of retrograde warm blood cardioplegia, the bulldog was removed off the mammary artery pedicle and air was removed from the heart.  The cross-clamp was then removed as the proximal vein anastomosis was tied.  The heart was cardioverted back to a regular rhythm.  The radial artery graft was then trimmed to the appropriate length and bevel and using vascular bulldogs, it was sewn end-to-side with running 7-0 Prolene to the posterolateral vein graft.  Bulldogs were removed and the anastomosis was intact and hemostasis was adequate.  The distal anastomoses were checked and found to be adequately hemostatic.  There was diffuse oozing from the raw surface of the heart from the dense adhesions which were taken down to mobilize the heart. Temporary pacing wires were applied.  The lungs were expanded and ventilator was resumed.  The patient was started on low-dose milrinone and dopamine.  Patient was in an adequately paced rhythm and separated from cardiopulmonary bypass.  However after about 5 minutes, blood pressure began to decrease and I felt the placement back on bypass for a balloon pump would be the best decision because of the patient's fairly long pump run and clamp time.  While on bypass, a 40 mL balloon pump was placed percutaneously over guidewire via the right femoral artery and started on augmentation. After further, but short pump run, the patient was separated from cardiopulmonary bypass without difficulty on low-dose inotropes and the balloon pump.  Protamine was administered slowly.  There was no adverse reaction.  Coagulation parameters returned with low platelet count of 20,000 and low fibrinogen of 120 and blood products were ordered.  Considerable period of time was spent drying up the operative field which included  surgical approach as well as blood products.  Gradually, there was improved coagulation function.  Anterior and posterior mediastinal chest tubes were placed and brought out through separate incisions.  A right pleural chest tube was placed and brought out through separate incision.  The left pleural space was adherent, was not entered.  A right-sided parasternal V-wire was placed.  Then the interrupted horizontal sternal wires were placed.  The sternum was carefully closed and the patient remained stable.  Cardiac output was 5 L.  Echo showed improved global function.  The pectoralis fascia was then closed with interrupted #1 Vicryl.  The subcutaneous and skin layers were closed in running Vicryl.  Sterile dressings were applied.  The patient returned to the ICU in critical, but stable condition.     Ivin Poot, M.D.     PV/MEDQ  D:  08/01/2015  T:  08/02/2015  Job:  [id number]

## 2015-08-02 NOTE — Progress Notes (Signed)
      [address]       [place], [address]             [phone]       POD # 1 redo CABG x 2  BP 105/56 mmHg  Pulse 84  Temp(Src) 99.5 F (37.5 C) (Core (Comment))  Resp 20  Wt 206 lb 2.1 oz (93.5 kg)  SpO2 98%  IABP at 1:1   Intake/Output Summary (Last 24 hours) at 08/02/15 1923 Last data filed at 08/02/15 1900  Gross per 24 hour  Intake 6403.21 ml  Output   3890 ml  Net 2513.21 ml   K= 3.9, creatinine 0.6 HCT= 23  Doing well, IABP to 1:2 at midnight  [place] C. Roxan Hockey, MD [facility] [phone]

## 2015-08-02 NOTE — Op Note (Deleted)
NAME:  Manuel Bell, Manuel Bell.:  [id number]  MEDICAL RECORD NO.:  [id number]  LOCATION:  [place]                       FACILITY:  [facility]  PHYSICIAN:  Ivin Poot, M.D.  DATE OF BIRTH:  1956/02/15  DATE OF PROCEDURE:  08/01/2015 DATE OF DISCHARGE:                              OPERATIVE REPORT   OPERATION: 1. Redo coronary artery bypass graft x2 (left radial artery free graft     to ramus intermediate, saphenous vein graft to posterolateral     branch of the distal circumflex). 2. Endoscopic harvest of left leg greater saphenous vein. 3. Left radial artery harvest. 4. Coronary endarterectomy of ramus intermediate coronary artery. 5. Placement of transfemoral intra-aortic balloon pump-right femoral     artery.  SURGEON:  Ivin Poot, MD  ASSISTANT:  John Giovanni, PA-C.  PREOPERATIVE DIAGNOSES:  Recurrent angina after previous multivessel coronary artery bypass graft with severe recurrent calcified disease, poor targets.  POSTOPERATIVE DIAGNOSES:  Recurrent angina after previous multivessel coronary artery bypass graft with severe recurrent calcified disease, poor targets.  ANESTHESIA:  General by Nelda Severe Tobias Alexander, MD  INDICATIONS:  The patient is a 60 year old, male who 4 years ago underwent multivessel CABG for symptoms of unstable angina.  He did well.  Recently he developed recurrent exertional chest pains and underwent cardiac catheterization by Dr. Angelena Form, which demonstrated recurrent disease with patent left IMA graft to the LAD, patent vein graft to the diagonal, occluded sequential saphenous vein graft to the circumflex vessels and a native right coronary with minimal disease.  LV function was fairly well preserved.  He was not felt to be a candidate for percutaneous intervention because of his severely calcified vessels and redo CABG was recommended.  I examined the patient in the office and reviewed the results of the cardiac catheterization  with the patient and his family.  I discussed the indications, expected benefits of high risk redo CABG.  I discussed the plan to use radial artery graft and an endoscopically harvest vein to graft the circumflex vessels.  I discussed with the patient, the risk to him and he understood that redo bypass surgery was at significantly greater risk than primary surgery and would take longer and would require greater recovery.  I discussed the major aspects of surgery including the use of general anesthesia and cardiopulmonary bypass, the location of the surgical incisions, and the expected postoperative recovery.  I discussed with the patient the risks to him of redo, high-risk for CABG including risks of bleeding, blood transfusion, stroke, MI, postoperative pulmonary problems including pleural effusion, infection, and death.  After reviewing these issues, he demonstrated his understanding and agreed to proceed with surgery under what I felt was an informed consent.  OPERATIVE FINDINGS: 1. Adequate conduit, but the vein was small distally, but was not     used. 2. Poor targets for grafting with the heavily calcified ramus     intermediate which was also intramyocardial and required     endarterectomy.  The posterolateral branch of the circumflex was a     1.2-mm vessel small, but adequate for grafting. 3. Some global LV hypokinesia after long pump run, necessitating  balloon pump to separate from cardiopulmonary bypass.  OPERATIVE PROCEDURE:  The patient was brought to the operating room, placed supine on the operating table.  General anesthesia was induced under invasive hemodynamic monitoring.  A transesophageal echo probe was placed by the anesthesiologist.  The left arm, chest, abdomen, and legs were prepped with Betadine and draped as a sterile field.  A proper time- out was performed.  A left arm incision was made and the radial artery was harvested as a free graft.  Before  harvesting, the distal palmar arch pulse was documented when clamping the radial artery with a vascular bulldog.  The radial artery graft was removed and placed in a papaverine-heparin solution and the incision was closed per routine. The arm was then tucked to the left side.  A sternal incision was made as the radial artery was being harvested. The oscillating saw was used to divide the sternal bone without injury to the underlying vascular structures.  The anterior mediastinal adhesions were dense.  These were carefully dissected to expose the right atrium, right ventricle, ascending aorta, and the patent vein graft to the diagonal.  Next, a left chest wall elevating retractor was used to further dissect the lateral part of the heart and to expose the patent mammary artery pedicle coming through the pericardium which was isolated with a vessel loop.  The chest wall elevating retractor was replaced with a sternal retractor.  Further dissection was performed and the pericardium was suspended.  Pursestrings were placed in the ascending aorta and right atrium and heparin was administered after the vein was harvested from the left leg endoscopically.  The patient was cannulated and placed on cardiopulmonary bypass.  The heart had severe adhesions, but the mammary artery and patent saphenous vein grafts were protected.  Cardioplegic cannulas were placed both antegrade and retrograde cold blood cardioplegia and A-line for CO2 insufflation in the surgical field was placed.  The aorta was dissected from the pulmonary artery to allow placement of cross-clamp.  The cross-clamp was then placed and hypothermic cardioplegic arrest was achieved using a L of cold blood cardioplegia in split doses between the antegrade aortic and retrograde coronary sinus catheters.  The mammary artery pedicle was clamped with a soft vascular bulldog during cardioplegic arrest.  After the heart was rested, the  dissection posteriorly was completed to mobilize the heart.  The sequential vein graft to the circumflex vessel was identified high in the AV groove.  With great difficulty, the ramus intermediate branch was identified.  It was heavily calcified.  It was dissected further distally in order to find an adequate place for the anastomosis.  However, the vessel stayed fairly calcified.  Cardioplegia was redosed every 20-30 minutes.  The heart was positioned and an arteriotomy was made in the ramus intermediate branch.  There was a posterior plaque.  The anastomosis using the radial artery and a running 8-0 Prolene was initiated however by halfway around the anastomosis, the suture could not be passed through the vessel wall because of the calcified plaque.  The anastomosis was then taken down and a formal endarterectomy of the ramus intermediate was performed.  This removed a heavily calcified plaque with fairly intact median adventitia.  The radial artery was then sewn to the artery with the arteriotomy extended somewhat distally where the vessel had become intramyocardial.  The end-to-side anastomosis with 8-0 Prolene was performed.  This was a check to cardioplegia.  The dissection of the vessel intramyocardially had some bleeding points  which were controlled with some 6-0 Prolene sutures to the sides of the vessel anastomosis.  Next, the heart was further mobilized and the posterolateral branch was identified after a dose of cardioplegia.  The posterior lateral branch was opened and it was 1.2-mm vessel.  The reverse saphenous vein was sewn end-to-side with running 7-0 Prolene.  There was good flow through this anastomosis.  Cardioplegia was redosed.  The vein graft and the radial artery graft were then brought beneath the mammary artery pedicle for placement on the ascending aorta.  The radial artery was sewn as a free graft to the proximal portion of the vein graft.  Vein graft was  sewn to the ascending aorta, had an old vein graft origin using a 4.0 mm punch and running 6-0 Prolene.  Air was vented from the coronaries with a dose of retrograde warm blood cardioplegia, the bulldog was removed off the mammary artery pedicle and air was removed from the heart.  The cross-clamp was then removed as the proximal vein anastomosis was tied.  The heart was cardioverted back to a regular rhythm.  The radial artery graft was then trimmed to the appropriate length and bevel and using vascular bulldogs, it was sewn end-to-side with running 7-0 Prolene to the posterolateral vein graft.  Bulldogs were removed and the anastomosis was intact and hemostasis was adequate.  The distal anastomoses were checked and found to be adequately hemostatic.  There was diffuse oozing from the raw surface of the heart from the dense adhesions which were taken down to mobilize the heart. Temporary pacing wires were applied.  The lungs were expanded and ventilator was resumed.  The patient was started on low-dose milrinone and dopamine.  Patient was in an adequately paced rhythm and separated from cardiopulmonary bypass.  However after about 5 minutes, blood pressure began to decrease and I felt the placement back on bypass for a balloon pump would be the best decision because of the patient's fairly long pump run and clamp time.  While on bypass, a 40 mL balloon pump was placed percutaneously over guidewire via the right femoral artery and started on augmentation. After further, but short pump run, the patient was separated from cardiopulmonary bypass without difficulty on low-dose inotropes and the balloon pump.  Protamine was administered slowly.  There was no adverse reaction.  Coagulation parameters returned with low platelet count of 20,000 and low fibrinogen of 120 and blood products were ordered.  Considerable period of time was spent drying up the operative field which included  surgical approach as well as blood products.  Gradually, there was improved coagulation function.  Anterior and posterior mediastinal chest tubes were placed and brought out through separate incisions.  A right pleural chest tube was placed and brought out through separate incision.  The left pleural space was adherent, was not entered.  A right-sided parasternal V-wire was placed.  Then the interrupted horizontal sternal wires were placed.  The sternum was carefully closed and the patient remained stable.  Cardiac output was 5 L.  Echo showed improved global function.  The pectoralis fascia was then closed with interrupted #1 Vicryl.  The subcutaneous and skin layers were closed in running Vicryl.  Sterile dressings were applied.  The patient returned to the ICU in critical, but stable condition.     Ivin Poot, M.D.     PV/MEDQ  D:  08/01/2015  T:  08/02/2015  Job:  [id number]

## 2015-08-03 ENCOUNTER — Inpatient Hospital Stay (HOSPITAL_COMMUNITY): Payer: BLUE CROSS/BLUE SHIELD

## 2015-08-03 LAB — POCT I-STAT, CHEM 8
BUN: 7 mg/dL (ref 6–20)
BUN: 9 mg/dL (ref 6–20)
Calcium, Ion: 0.97 mmol/L — ABNORMAL LOW (ref 1.13–1.30)
Calcium, Ion: 1.01 mmol/L — ABNORMAL LOW (ref 1.13–1.30)
Chloride: 94 mmol/L — ABNORMAL LOW (ref 101–111)
Chloride: 97 mmol/L — ABNORMAL LOW (ref 101–111)
Creatinine, Ser: 0.6 mg/dL — ABNORMAL LOW (ref 0.61–1.24)
Creatinine, Ser: 0.6 mg/dL — ABNORMAL LOW (ref 0.61–1.24)
Glucose, Bld: 127 mg/dL — ABNORMAL HIGH (ref 65–99)
Glucose, Bld: 197 mg/dL — ABNORMAL HIGH (ref 65–99)
HCT: 23 % — ABNORMAL LOW (ref 39.0–52.0)
HCT: 27 % — ABNORMAL LOW (ref 39.0–52.0)
Hemoglobin: 7.8 g/dL — ABNORMAL LOW (ref 13.0–17.0)
Hemoglobin: 9.2 g/dL — ABNORMAL LOW (ref 13.0–17.0)
Potassium: 3.7 mmol/L (ref 3.5–5.1)
Potassium: 3.8 mmol/L (ref 3.5–5.1)
Sodium: 134 mmol/L — ABNORMAL LOW (ref 135–145)
Sodium: 135 mmol/L (ref 135–145)
TCO2: 24 mmol/L (ref 0–100)
TCO2: 27 mmol/L (ref 0–100)

## 2015-08-03 LAB — POCT I-STAT 3, ART BLOOD GAS (G3+)
Acid-Base Excess: 3 mmol/L — ABNORMAL HIGH (ref 0.0–2.0)
Bicarbonate: 27.4 mEq/L — ABNORMAL HIGH (ref 20.0–24.0)
O2 Saturation: 94 %
Patient temperature: 37
TCO2: 29 mmol/L (ref 0–100)
pCO2 arterial: 41.3 mmHg (ref 35.0–45.0)
pH, Arterial: 7.43 (ref 7.350–7.450)
pO2, Arterial: 67 mmHg — ABNORMAL LOW (ref 80.0–100.0)

## 2015-08-03 LAB — COMPREHENSIVE METABOLIC PANEL
ALK PHOS: 28 U/L — AB (ref 38–126)
ALT: 26 U/L (ref 17–63)
AST: 95 U/L — AB (ref 15–41)
Albumin: 2.8 g/dL — ABNORMAL LOW (ref 3.5–5.0)
Anion gap: 9 (ref 5–15)
BILIRUBIN TOTAL: 0.7 mg/dL (ref 0.3–1.2)
BUN: 8 mg/dL (ref 6–20)
CO2: 27 mmol/L (ref 22–32)
CREATININE: 0.75 mg/dL (ref 0.61–1.24)
Calcium: 7.5 mg/dL — ABNORMAL LOW (ref 8.9–10.3)
Chloride: 99 mmol/L — ABNORMAL LOW (ref 101–111)
GFR calc Af Amer: >60 mL/min (ref >60)
Glucose, Bld: 150 mg/dL — ABNORMAL HIGH (ref 65–99)
Potassium: 3.8 mmol/L (ref 3.5–5.1)
Sodium: 135 mmol/L (ref 135–145)
TOTAL PROTEIN: 4.9 g/dL — AB (ref 6.5–8.1)

## 2015-08-03 LAB — CBC
HCT: 27.1 % — ABNORMAL LOW (ref 39.0–52.0)
HEMATOCRIT: 22.4 % — AB (ref 39.0–52.0)
Hemoglobin: 7.8 g/dL — ABNORMAL LOW (ref 13.0–17.0)
Hemoglobin: 9.5 g/dL — ABNORMAL LOW (ref 13.0–17.0)
MCH: 31.1 pg (ref 26.0–34.0)
MCH: 31.9 pg (ref 26.0–34.0)
MCHC: 34.8 g/dL (ref 30.0–36.0)
MCHC: 35.1 g/dL (ref 30.0–36.0)
MCV: 89.2 fL (ref 78.0–100.0)
MCV: 90.9 fL (ref 78.0–100.0)
Platelets: 59 10*3/uL — ABNORMAL LOW (ref 150–400)
Platelets: 66 10*3/uL — ABNORMAL LOW (ref 150–400)
RBC: 2.51 MIL/uL — AB (ref 4.22–5.81)
RBC: 2.98 MIL/uL — ABNORMAL LOW (ref 4.22–5.81)
RDW: 15.5 % (ref 11.5–15.5)
RDW: 15.5 % (ref 11.5–15.5)
WBC: 8.3 10*3/uL (ref 4.0–10.5)
WBC: 8.6 10*3/uL (ref 4.0–10.5)

## 2015-08-03 LAB — GLUCOSE, CAPILLARY
Glucose-Capillary: 163 mg/dL — ABNORMAL HIGH (ref 65–99)
Glucose-Capillary: 135 mg/dL — ABNORMAL HIGH (ref 65–99)
Glucose-Capillary: 136 mg/dL — ABNORMAL HIGH (ref 65–99)
Glucose-Capillary: 143 mg/dL — ABNORMAL HIGH (ref 65–99)
Glucose-Capillary: 143 mg/dL — ABNORMAL HIGH (ref 65–99)

## 2015-08-03 LAB — PROTIME-INR
INR: 1.27 (ref 0.00–1.49)
Prothrombin Time: 16 seconds — ABNORMAL HIGH (ref 11.6–15.2)

## 2015-08-03 LAB — POCT ACTIVATED CLOTTING TIME: Activated Clotting Time: 142 seconds

## 2015-08-03 LAB — CARBOXYHEMOGLOBIN
Carboxyhemoglobin: 1.4 % (ref 0.5–1.5)
Methemoglobin: 1 % (ref 0.0–1.5)
O2 Saturation: 52.6 %
Total hemoglobin: 7.8 g/dL — ABNORMAL LOW (ref 13.5–18.0)

## 2015-08-03 LAB — PREPARE PLATELET PHERESIS: Unit division: 0

## 2015-08-03 LAB — APTT: aPTT: 34 seconds (ref 24–37)

## 2015-08-03 LAB — PREPARE RBC (CROSSMATCH)

## 2015-08-03 MED ORDER — FUROSEMIDE 10 MG/ML IJ SOLN
40.0000 mg | Freq: Two times a day (BID) | INTRAMUSCULAR | Status: DC
  Administered 2015-08-03 – 2015-08-05 (×5): 40 mg via INTRAVENOUS
  Filled 2015-08-03 (×6): qty 4

## 2015-08-03 MED ORDER — FUROSEMIDE 10 MG/ML IJ SOLN
40.0000 mg | Freq: Once | INTRAMUSCULAR | Status: AC
  Administered 2015-08-03: 40 mg via INTRAVENOUS

## 2015-08-03 MED ORDER — POTASSIUM CHLORIDE 10 MEQ/50ML IV SOLN
10.0000 meq | Freq: Once | INTRAVENOUS | Status: AC
Start: 2015-08-03 — End: 2015-08-03
  Administered 2015-08-03: 10 meq via INTRAVENOUS
  Filled 2015-08-03: qty 50

## 2015-08-03 MED ORDER — POTASSIUM CHLORIDE 10 MEQ/50ML IV SOLN
10.0000 meq | INTRAVENOUS | Status: AC
  Administered 2015-08-03 (×2): 10 meq via INTRAVENOUS
  Filled 2015-08-03 (×2): qty 50

## 2015-08-03 MED ORDER — POTASSIUM CHLORIDE 10 MEQ/50ML IV SOLN
10.0000 meq | INTRAVENOUS | Status: AC
  Administered 2015-08-03 (×3): 10 meq via INTRAVENOUS
  Filled 2015-08-03 (×3): qty 50

## 2015-08-03 NOTE — Progress Notes (Signed)
2 Days Post-Op Procedure(s) (LRB): REDO CORONARY ARTERY BYPASS GRAFTING x two (CABG) x   using left radial artery, and left leg greater saphenous vein harvested endoscopically, also performed coronary endarderectomy. Placement of Intra aortic balloon pump (IAB). (N/A) RADIAL ARTERY HARVEST (Left) TRANSESOPHAGEAL ECHOCARDIOGRAM (TEE) (N/A) Subjective: Had a good night IABP weaned and will remove this am after platelets in Off norepi nsr cxr clear  Objective: Vital signs in last 24 hours: Temp:  [98.2 F (36.8 C)-99.7 F (37.6 C)] 98.8 F (37.1 C) (02/09 0800) Pulse Rate:  [38-181] 86 (02/09 0800) Cardiac Rhythm:  [-] Normal sinus rhythm (02/09 0800) Resp:  [9-24] 13 (02/09 0800) BP: (104-109)/(56-75) 105/56 mmHg (02/08 1600) SpO2:  [96 %-100 %] 98 % (02/09 0800) Arterial Line BP: (88-151)/(36-69) 140/66 mmHg (02/09 0800) FiO2 (%):  [50 %] 50 % (02/08 0925) Weight:  [203 lb 4.2 oz (92.2 kg)] 203 lb 4.2 oz (92.2 kg) (02/09 0645)  Hemodynamic parameters for last 24 hours: PAP: (32-54)/(11-31) 47/25 mmHg CO:  [4.8 L/min-6.7 L/min] 5.8 L/min CI:  [2.4 L/min/m2-3.4 L/min/m2] 2.9 L/min/m2  Intake/Output from previous day: 02/08 0701 - 02/09 0700 In: 4224.8 [P.O.:240; I.V.:2337.8; Blood:967; NG/GT:30; IV Piggyback:650] Out: 2935 [Urine:2355; Emesis/NG output:100; Chest Tube:480] Intake/Output this shift: Total I/O In: 654.9 [I.V.:654.9] Out: 325 [Urine:325]       Exam    General- alert and comfortable   Lungs- clear without rales, wheezes   Cor- regular rate and rhythm, no murmur , gallop   Abdomen- soft, non-tender   Extremities - warm, non-tender, mild  edema   Neuro- oriented, appropriate, no focal weakness   Lab Results:  Recent Labs  08/02/15 1642  08/03/15 0006 08/03/15 0400  WBC 6.8  --   --  8.3  HGB 8.4*  < > 7.8* 7.8*  HCT 23.3*  < > 23.0* 22.4*  PLT 49*  --   --  59*  < > = values in this interval not displayed. BMET:  Recent Labs  08/02/15 0403   08/03/15 0006 08/03/15 0400  NA 140  < > 135 135  K 3.2*  < > 3.8 3.8  CL 108  < > 97* 99*  CO2 26  --   --  27  GLUCOSE 101*  < > 197* 150*  BUN 7  < > 7 8  CREATININE 0.83  < > 0.60* 0.75  CALCIUM 7.8*  --   --  7.5*  < > = values in this interval not displayed.  PT/INR:  Recent Labs  08/03/15 0400  LABPROT 16.0*  INR 1.27   ABG    Component Value Date/Time   PHART 7.430 08/03/2015 0356   HCO3 27.4* 08/03/2015 0356   TCO2 29 08/03/2015 0356   ACIDBASEDEF 2.0 08/01/2015 1915   O2SAT 52.6 08/03/2015 0625   CBG (last 3)   Recent Labs  08/02/15 1917 08/03/15 0358 08/03/15 0735  GLUCAP 155* 135* 136*    Assessment/Plan: S/P Procedure(s) (LRB): REDO CORONARY ARTERY BYPASS GRAFTING x two (CABG) x   using left radial artery, and left leg greater saphenous vein harvested endoscopically, also performed coronary endarderectomy. Placement of Intra aortic balloon pump (IAB). (N/A) RADIAL ARTERY HARVEST (Left) TRANSESOPHAGEAL ECHOCARDIOGRAM (TEE) (N/A) Mobilize Diuresis Diabetes control DC IABP then OOB to chair   LOS: 2 days    Tharon Aquas Trigt III 08/03/2015

## 2015-08-03 NOTE — Progress Notes (Signed)
CT surgery p.m. Rounds  Patient had balloon pump removed and stated on side of bed Pressures are being weaned Good diuresis Maintaining sinus rhythm We'll DC Swan, DC A line P.m. labs are reviewed and are satisfactory--platelet count remains low at 65,000

## 2015-08-03 NOTE — Progress Notes (Signed)
Right femoral sheath removed and pressure held for 30 minutes by Celesta Gentile RN. No apparent complications from hold. Groin level zero. Right distal pedal pulse palpable. Patient verbalizes understanding of post sheath instructions. Downtime for six hours begins at 11:30 am.

## 2015-08-03 NOTE — Care Management Note (Signed)
Case Management Note  Patient Details  Name: KYRIEE KOERBER MRN: [id number] Date of Birth: Nov 26, 1955  Subjective/Objective:  Pt is s/p CABG                   Action/Plan:  Pt is independent from home alone.  Post discharge pt will have 24 hour supervision provided by sister and friends.  CM will continue to monitor for discharge needs   Expected Discharge Date:                  Expected Discharge Plan:  Home/Self Care  In-House Referral:     Discharge planning Services  CM Consult  Post Acute Care Choice:    Choice offered to:     DME Arranged:    DME Agency:     HH Arranged:    HH Agency:     Status of Service:  In process, will continue to follow  Medicare Important Message Given:    Date Medicare IM Given:    Medicare IM give by:    Date Additional Medicare IM Given:    Additional Medicare Important Message give by:     If discussed at [place] of Stay Meetings, dates discussed:    Additional Comments:  Maryclare Labrador, RN 08/03/2015, 7:49 AM

## 2015-08-04 ENCOUNTER — Inpatient Hospital Stay (HOSPITAL_COMMUNITY): Payer: BLUE CROSS/BLUE SHIELD

## 2015-08-04 LAB — COMPREHENSIVE METABOLIC PANEL
ALT: 31 U/L (ref 17–63)
ANION GAP: 11 (ref 5–15)
AST: 84 U/L — ABNORMAL HIGH (ref 15–41)
Albumin: 2.9 g/dL — ABNORMAL LOW (ref 3.5–5.0)
Alkaline Phosphatase: 36 U/L — ABNORMAL LOW (ref 38–126)
BUN: 13 mg/dL (ref 6–20)
CHLORIDE: 96 mmol/L — AB (ref 101–111)
CO2: 27 mmol/L (ref 22–32)
CREATININE: 0.78 mg/dL (ref 0.61–1.24)
Calcium: 7.7 mg/dL — ABNORMAL LOW (ref 8.9–10.3)
Glucose, Bld: 144 mg/dL — ABNORMAL HIGH (ref 65–99)
Potassium: 4.2 mmol/L (ref 3.5–5.1)
SODIUM: 134 mmol/L — AB (ref 135–145)
Total Bilirubin: 0.5 mg/dL (ref 0.3–1.2)
Total Protein: 5.3 g/dL — ABNORMAL LOW (ref 6.5–8.1)

## 2015-08-04 LAB — GLUCOSE, CAPILLARY
Glucose-Capillary: 115 mg/dL — ABNORMAL HIGH (ref 65–99)
Glucose-Capillary: 118 mg/dL — ABNORMAL HIGH (ref 65–99)
Glucose-Capillary: 127 mg/dL — ABNORMAL HIGH (ref 65–99)
Glucose-Capillary: 132 mg/dL — ABNORMAL HIGH (ref 65–99)
Glucose-Capillary: 133 mg/dL — ABNORMAL HIGH (ref 65–99)
Glucose-Capillary: 144 mg/dL — ABNORMAL HIGH (ref 65–99)

## 2015-08-04 LAB — CBC
HEMATOCRIT: 28.9 % — AB (ref 39.0–52.0)
HEMOGLOBIN: 9.6 g/dL — AB (ref 13.0–17.0)
MCH: 30.5 pg (ref 26.0–34.0)
MCHC: 33.2 g/dL (ref 30.0–36.0)
MCV: 91.7 fL (ref 78.0–100.0)
Platelets: 104 10*3/uL — ABNORMAL LOW (ref 150–400)
RBC: 3.15 MIL/uL — ABNORMAL LOW (ref 4.22–5.81)
RDW: 15.7 % — AB (ref 11.5–15.5)
WBC: 12 10*3/uL — ABNORMAL HIGH (ref 4.0–10.5)

## 2015-08-04 LAB — CARBOXYHEMOGLOBIN
Carboxyhemoglobin: 1.5 % (ref 0.5–1.5)
Methemoglobin: 1.1 % (ref 0.0–1.5)
O2 Saturation: 58.6 %
Total hemoglobin: 9.7 g/dL — ABNORMAL LOW (ref 13.5–18.0)

## 2015-08-04 LAB — PREPARE PLATELET PHERESIS: Unit division: 0

## 2015-08-04 MED ORDER — ALPRAZOLAM 0.5 MG PO TABS
0.5000 mg | ORAL_TABLET | Freq: Every day | ORAL | Status: DC
  Administered 2015-08-04 – 2015-08-07 (×4): 0.5 mg via ORAL
  Filled 2015-08-04 (×4): qty 1

## 2015-08-04 MED ORDER — MILRINONE IN DEXTROSE 20 MG/100ML IV SOLN
0.1250 ug/kg/min | INTRAVENOUS | Status: DC
  Administered 2015-08-05: 0.125 ug/kg/min via INTRAVENOUS
  Filled 2015-08-04: qty 100

## 2015-08-04 MED ORDER — METOPROLOL TARTRATE 25 MG PO TABS
25.0000 mg | ORAL_TABLET | Freq: Two times a day (BID) | ORAL | Status: DC
  Administered 2015-08-04 – 2015-08-08 (×8): 25 mg via ORAL
  Filled 2015-08-04 (×8): qty 1

## 2015-08-04 MED ORDER — DIGOXIN 0.25 MG/ML IJ SOLN
0.2500 mg | Freq: Every day | INTRAMUSCULAR | Status: AC
  Administered 2015-08-04: 0.25 mg via INTRAVENOUS
  Filled 2015-08-04: qty 2

## 2015-08-04 MED ORDER — MILRINONE IN DEXTROSE 20 MG/100ML IV SOLN: 0.2500 ug/kg/min | INTRAVENOUS | Status: DC

## 2015-08-04 MED ORDER — AMIODARONE HCL 200 MG PO TABS
400.0000 mg | ORAL_TABLET | Freq: Two times a day (BID) | ORAL | Status: DC
  Administered 2015-08-04 – 2015-08-08 (×9): 400 mg via ORAL
  Filled 2015-08-04 (×9): qty 2

## 2015-08-04 MED ORDER — ASPIRIN EC 81 MG PO TBEC
81.0000 mg | DELAYED_RELEASE_TABLET | Freq: Every day | ORAL | Status: DC
  Administered 2015-08-04 – 2015-08-08 (×5): 81 mg via ORAL
  Filled 2015-08-04 (×5): qty 1

## 2015-08-04 MED ORDER — DIGOXIN 125 MCG PO TABS
0.1250 mg | ORAL_TABLET | Freq: Every day | ORAL | Status: DC
  Administered 2015-08-05 – 2015-08-06 (×2): 0.125 mg via ORAL
  Filled 2015-08-04 (×2): qty 1

## 2015-08-04 NOTE — Progress Notes (Signed)
      [address]       [place] [address]             [phone]      POD # 3 redo CABG  Comfortable  BP 118/83 mmHg  Pulse 104  Temp(Src) 98.4 F (36.9 C) (Oral)  Resp 13  Ht 5\' 8"  (1.727 m)  Wt 205 lb 14.6 oz (93.4 kg)  BMI 31.32 kg/m2  SpO2 93%   Intake/Output Summary (Last 24 hours) at 08/04/15 1735 Last data filed at 08/04/15 1200  Gross per 24 hour  Intake 1073.9 ml  Output   1885 ml  Net -811.1 ml    Diuresing well  Milrinone being slowly weaned  Remo Lipps C. Roxan Hockey, MD [facility] [phone]

## 2015-08-04 NOTE — Progress Notes (Signed)
3 Days Post-Op Procedure(s) (LRB): REDO CORONARY ARTERY BYPASS GRAFTING x two (CABG) x   using left radial artery, and left leg greater saphenous vein harvested endoscopically, also performed coronary endarderectomy. Placement of Intra aortic balloon pump (IAB). (N/A) RADIAL ARTERY HARVEST (Left) TRANSESOPHAGEAL ECHOCARDIOGRAM (TEE) (N/A) Subjective: Redo CABG w/ IABP Drips weaning, chest tubes removed , CXR clear IABP out yesterday Objective: Vital signs in last 24 hours: Temp:  [98.3 F (36.8 C)-99.9 F (37.7 C)] 98.4 F (36.9 C) (02/10 1548) Pulse Rate:  [85-109] 100 (02/10 1500) Cardiac Rhythm:  [-] Normal sinus rhythm;Sinus tachycardia (02/10 0800) Resp:  [7-24] 11 (02/10 1500) BP: (104-124)/(75-93) 124/92 mmHg (02/10 1500) SpO2:  [90 %-98 %] 92 % (02/10 1500) Arterial Line BP: (110-176)/(65-82) 128/73 mmHg (02/10 0500) Weight:  [205 lb 14.6 oz (93.4 kg)] 205 lb 14.6 oz (93.4 kg) (02/10 0630)  Hemodynamic parameters for last 24 hours: PAP: (44-49)/(30-36) 47/36 mmHg CO:  [5.8 L/min] 5.8 L/min CI:  [2.9 L/min/m2] 2.9 L/min/m2  Intake/Output from previous day: 02/09 0701 - 02/10 0700 In: [id number] [I.V.:4867.2; Blood:516; IV Piggyback:300] Out: [id number] [Urine:2765; Chest Tube:480] Intake/Output this shift: Total I/O In: 182.1 [I.V.:182.1] Out: 775 [Urine:775]       Exam    General- alert and comfortable   Lungs- clear without rales, wheezes   Cor- regular rate and rhythm, no murmur , gallop   Abdomen- soft, non-tender   Extremities - warm, non-tender, minimal edema   Neuro- oriented, appropriate, no focal weakness   Lab Results:  Recent Labs  08/03/15 1420 08/03/15 1532 08/04/15 0411  WBC 8.6  --  12.0*  HGB 9.5* 9.2* 9.6*  HCT 27.1* 27.0* 28.9*  PLT 66*  --  104*   BMET:  Recent Labs  08/03/15 0400 08/03/15 1532 08/04/15 0411  NA 135 134* 134*  K 3.8 3.7 4.2  CL 99* 94* 96*  CO2 27  --  27  GLUCOSE 150* 127* 144*  BUN 8 9 13   CREATININE 0.75 0.60*  0.78  CALCIUM 7.5*  --  7.7*    PT/INR:  Recent Labs  08/03/15 0400  LABPROT 16.0*  INR 1.27   ABG    Component Value Date/Time   PHART 7.430 08/03/2015 0356   HCO3 27.4* 08/03/2015 0356   TCO2 27 08/03/2015 1532   ACIDBASEDEF 2.0 08/01/2015 1915   O2SAT 58.6 08/04/2015 1939   CBG (last 3)   Recent Labs  08/04/15 0809 08/04/15 1206 08/04/15 1547  GLUCAP 144* 118* 127*    Assessment/Plan: S/P Procedure(s) (LRB): REDO CORONARY ARTERY BYPASS GRAFTING x two (CABG) x   using left radial artery, and left leg greater saphenous vein harvested endoscopically, also performed coronary endarderectomy. Placement of Intra aortic balloon pump (IAB). (N/A) RADIAL ARTERY HARVEST (Left) TRANSESOPHAGEAL ECHOCARDIOGRAM (TEE) (N/A) Mobilize Diuresis slow wean of milrinone over 24 hrs   LOS: 3 days    Tharon Aquas Trigt III 08/04/2015

## 2015-08-05 ENCOUNTER — Inpatient Hospital Stay (HOSPITAL_COMMUNITY): Payer: BLUE CROSS/BLUE SHIELD

## 2015-08-05 LAB — TYPE AND SCREEN
ABO/RH(D): O POS
Antibody Screen: NEGATIVE
Unit division: 0
Unit division: 0
Unit division: 0
Unit division: 0
Unit division: 0
Unit division: 0
Unit division: 0
Unit division: 0
Unit division: 0

## 2015-08-05 LAB — CBC
HEMATOCRIT: 28.8 % — AB (ref 39.0–52.0)
HEMOGLOBIN: 9.3 g/dL — AB (ref 13.0–17.0)
MCH: 30.6 pg (ref 26.0–34.0)
MCHC: 32.3 g/dL (ref 30.0–36.0)
MCV: 94.7 fL (ref 78.0–100.0)
Platelets: 94 10*3/uL — ABNORMAL LOW (ref 150–400)
RBC: 3.04 MIL/uL — ABNORMAL LOW (ref 4.22–5.81)
RDW: 15.7 % — ABNORMAL HIGH (ref 11.5–15.5)
WBC: 9 10*3/uL (ref 4.0–10.5)

## 2015-08-05 LAB — GLUCOSE, CAPILLARY
Glucose-Capillary: 109 mg/dL — ABNORMAL HIGH (ref 65–99)
Glucose-Capillary: 110 mg/dL — ABNORMAL HIGH (ref 65–99)
Glucose-Capillary: 121 mg/dL — ABNORMAL HIGH (ref 65–99)
Glucose-Capillary: 92 mg/dL (ref 65–99)
Glucose-Capillary: 115 mg/dL — ABNORMAL HIGH (ref 65–99)

## 2015-08-05 LAB — COMPREHENSIVE METABOLIC PANEL
ALBUMIN: 2.7 g/dL — AB (ref 3.5–5.0)
ALK PHOS: 39 U/L (ref 38–126)
ALT: 28 U/L (ref 17–63)
ANION GAP: 10 (ref 5–15)
AST: 61 U/L — ABNORMAL HIGH (ref 15–41)
BILIRUBIN TOTAL: 0.7 mg/dL (ref 0.3–1.2)
BUN: 13 mg/dL (ref 6–20)
CALCIUM: 7.9 mg/dL — AB (ref 8.9–10.3)
CO2: 31 mmol/L (ref 22–32)
CREATININE: 0.78 mg/dL (ref 0.61–1.24)
Chloride: 95 mmol/L — ABNORMAL LOW (ref 101–111)
GFR calc Af Amer: >60 mL/min (ref >60)
GFR calc non Af Amer: >60 mL/min (ref >60)
GLUCOSE: 120 mg/dL — AB (ref 65–99)
Potassium: 3.5 mmol/L (ref 3.5–5.1)
Sodium: 136 mmol/L (ref 135–145)
TOTAL PROTEIN: 5.2 g/dL — AB (ref 6.5–8.1)

## 2015-08-05 MED ORDER — INSULIN ASPART 100 UNIT/ML ~~LOC~~ SOLN
0.0000 [IU] | Freq: Three times a day (TID) | SUBCUTANEOUS | Status: DC
  Administered 2015-08-07 – 2015-08-08 (×3): 2 [IU] via SUBCUTANEOUS

## 2015-08-05 MED ORDER — POTASSIUM CHLORIDE 10 MEQ/50ML IV SOLN
10.0000 meq | INTRAVENOUS | Status: AC
  Administered 2015-08-05 (×3): 10 meq via INTRAVENOUS
  Filled 2015-08-05 (×2): qty 50

## 2015-08-05 NOTE — Progress Notes (Signed)
4 Days Post-Op Procedure(s) (LRB): REDO CORONARY ARTERY BYPASS GRAFTING x two (CABG) x   using left radial artery, and left leg greater saphenous vein harvested endoscopically, also performed coronary endarderectomy. Placement of Intra aortic balloon pump (IAB). (N/A) RADIAL ARTERY HARVEST (Left) TRANSESOPHAGEAL ECHOCARDIOGRAM (TEE) (N/A) Subjective: Tired after walk  Objective: Vital signs in last 24 hours: Temp:  [98 F (36.7 C)-99.1 F (37.3 C)] 99.1 F (37.3 C) (02/11 0809) Pulse Rate:  [68-125] 99 (02/11 0700) Cardiac Rhythm:  [-] Normal sinus rhythm (02/11 0400) Resp:  [7-28] 10 (02/11 0700) BP: (97-130)/(78-100) 119/79 mmHg (02/11 0700) SpO2:  [86 %-100 %] 100 % (02/11 0700) Weight:  [205 lb 11 oz (93.3 kg)] 205 lb 11 oz (93.3 kg) (02/11 0500)  Hemodynamic parameters for last 24 hours:    Intake/Output from previous day: 02/10 0701 - 02/11 0700 In: 518.2 [I.V.:518.2] Out: 2510 [Urine:2510] Intake/Output this shift:    General appearance: alert, cooperative and no distress Neurologic: intact Heart: tachy, regular Lungs: diminished breath sounds bibasilar Abdomen: normal findings: soft, non-tender  Lab Results:  Recent Labs  08/04/15 0411 08/05/15 0425  WBC 12.0* 9.0  HGB 9.6* 9.3*  HCT 28.9* 28.8*  PLT 104* 94*   BMET:  Recent Labs  08/04/15 0411 08/05/15 0425  NA 134* 136  K 4.2 3.5  CL 96* 95*  CO2 27 31  GLUCOSE 144* 120*  BUN 13 13  CREATININE 0.78 0.78  CALCIUM 7.7* 7.9*    PT/INR:  Recent Labs  08/03/15 0400  LABPROT 16.0*  INR 1.27   ABG    Component Value Date/Time   PHART 7.430 08/03/2015 0356   HCO3 27.4* 08/03/2015 0356   TCO2 27 08/03/2015 1532   ACIDBASEDEF 2.0 08/01/2015 1915   O2SAT 58.6 08/04/2015 1939   CBG (last 3)   Recent Labs  08/04/15 2007 08/05/15 0029 08/05/15 0409  GLUCAP 132* 109* 110*    Assessment/Plan: S/P Procedure(s) (LRB): REDO CORONARY ARTERY BYPASS GRAFTING x two (CABG) x   using left  radial artery, and left leg greater saphenous vein harvested endoscopically, also performed coronary endarderectomy. Placement of Intra aortic balloon pump (IAB). (N/A) RADIAL ARTERY HARVEST (Left) TRANSESOPHAGEAL ECHOCARDIOGRAM (TEE) (N/A) -  CV- stable, wean milrinone off as tolerated  RESP- continue IS  RENAL- creatinine OK, continue BID lasix  Supplement K  ENDO- CBG Ok- change to CBG AC/HS  Thrombocytopenia- PLT down slightly, not receiving heparin, follow   LOS: 4 days    Melrose Nakayama 08/05/2015

## 2015-08-05 NOTE — Progress Notes (Signed)
      [address]       [place], [address]             [phone]      PM ROUNDS  No complaints  BP 137/93 mmHg  Pulse 111  Temp(Src) 99.1 F (37.3 C) (Oral)  Resp 15  Ht 5\' 8"  (1.727 m)  Wt 205 lb 11 oz (93.3 kg)  BMI 31.28 kg/m2  SpO2 92%   Intake/Output Summary (Last 24 hours) at 08/05/15 1822 Last data filed at 08/05/15 1800  Gross per 24 hour  Intake 1032.46 ml  Output   3635 ml  Net -2602.54 ml    No new issues  Remo Lipps C. Roxan Hockey, MD [facility] [phone]

## 2015-08-06 LAB — GLUCOSE, CAPILLARY
Glucose-Capillary: 100 mg/dL — ABNORMAL HIGH (ref 65–99)
Glucose-Capillary: 117 mg/dL — ABNORMAL HIGH (ref 65–99)
Glucose-Capillary: 112 mg/dL — ABNORMAL HIGH (ref 65–99)
Glucose-Capillary: 115 mg/dL — ABNORMAL HIGH (ref 65–99)
Glucose-Capillary: 98 mg/dL (ref 65–99)

## 2015-08-06 LAB — COMPREHENSIVE METABOLIC PANEL
ALT: 28 U/L (ref 17–63)
AST: 50 U/L — AB (ref 15–41)
Albumin: 2.6 g/dL — ABNORMAL LOW (ref 3.5–5.0)
Alkaline Phosphatase: 49 U/L (ref 38–126)
Anion gap: 9 (ref 5–15)
BUN: 16 mg/dL (ref 6–20)
CALCIUM: 8 mg/dL — AB (ref 8.9–10.3)
CHLORIDE: 96 mmol/L — AB (ref 101–111)
CO2: 32 mmol/L (ref 22–32)
CREATININE: 0.82 mg/dL (ref 0.61–1.24)
GFR calc Af Amer: >60 mL/min (ref >60)
GLUCOSE: 109 mg/dL — AB (ref 65–99)
Potassium: 3.1 mmol/L — ABNORMAL LOW (ref 3.5–5.1)
SODIUM: 137 mmol/L (ref 135–145)
Total Bilirubin: 1.1 mg/dL (ref 0.3–1.2)
Total Protein: 5.3 g/dL — ABNORMAL LOW (ref 6.5–8.1)

## 2015-08-06 LAB — CBC
HCT: 28.3 % — ABNORMAL LOW (ref 39.0–52.0)
HEMOGLOBIN: 9 g/dL — AB (ref 13.0–17.0)
MCH: 30.4 pg (ref 26.0–34.0)
MCHC: 31.8 g/dL (ref 30.0–36.0)
MCV: 95.6 fL (ref 78.0–100.0)
PLATELETS: 90 10*3/uL — AB (ref 150–400)
RBC: 2.96 MIL/uL — AB (ref 4.22–5.81)
RDW: 15.8 % — ABNORMAL HIGH (ref 11.5–15.5)
WBC: 5.9 10*3/uL (ref 4.0–10.5)

## 2015-08-06 MED ORDER — POTASSIUM CHLORIDE 10 MEQ/50ML IV SOLN
10.0000 meq | INTRAVENOUS | Status: AC
  Administered 2015-08-06 (×2): 10 meq via INTRAVENOUS
  Filled 2015-08-06 (×2): qty 50

## 2015-08-06 MED ORDER — SODIUM CHLORIDE 0.9% FLUSH: 3.0000 mL | INTRAVENOUS | Status: DC | PRN

## 2015-08-06 MED ORDER — ZOLPIDEM TARTRATE 5 MG PO TABS: 5.0000 mg | ORAL_TABLET | Freq: Every evening | ORAL | Status: DC | PRN

## 2015-08-06 MED ORDER — ALUM & MAG HYDROXIDE-SIMETH 200-200-20 MG/5ML PO SUSP: 15.0000 mL | ORAL | Status: DC | PRN

## 2015-08-06 MED ORDER — SODIUM CHLORIDE 0.9% FLUSH
3.0000 mL | Freq: Two times a day (BID) | INTRAVENOUS | Status: DC
  Administered 2015-08-07 – 2015-08-08 (×3): 3 mL via INTRAVENOUS

## 2015-08-06 MED ORDER — POTASSIUM CHLORIDE CRYS ER 20 MEQ PO TBCR
20.0000 meq | EXTENDED_RELEASE_TABLET | Freq: Two times a day (BID) | ORAL | Status: DC
  Administered 2015-08-06 (×2): 20 meq via ORAL
  Filled 2015-08-06 (×2): qty 1

## 2015-08-06 MED ORDER — MOVING RIGHT ALONG BOOK
Freq: Once | Status: AC
  Administered 2015-08-06: 14:00:00
  Filled 2015-08-06: qty 1

## 2015-08-06 MED ORDER — MAGNESIUM HYDROXIDE 400 MG/5ML PO SUSP: 30.0000 mL | Freq: Every day | ORAL | Status: DC | PRN

## 2015-08-06 MED ORDER — POTASSIUM CHLORIDE 10 MEQ/50ML IV SOLN
10.0000 meq | INTRAVENOUS | Status: AC | PRN
  Administered 2015-08-06 (×3): 10 meq via INTRAVENOUS

## 2015-08-06 MED ORDER — FUROSEMIDE 40 MG PO TABS
40.0000 mg | ORAL_TABLET | Freq: Every day | ORAL | Status: DC
  Administered 2015-08-06 – 2015-08-08 (×3): 40 mg via ORAL
  Filled 2015-08-06 (×3): qty 1

## 2015-08-06 MED ORDER — SODIUM CHLORIDE 0.9 % IV SOLN: 250.0000 mL | INTRAVENOUS | Status: DC | PRN

## 2015-08-06 NOTE — Progress Notes (Signed)
5 Days Post-Op Procedure(s) (LRB): REDO CORONARY ARTERY BYPASS GRAFTING x two (CABG) x   using left radial artery, and left leg greater saphenous vein harvested endoscopically, also performed coronary endarderectomy. Placement of Intra aortic balloon pump (IAB). (N/A) RADIAL ARTERY HARVEST (Left) TRANSESOPHAGEAL ECHOCARDIOGRAM (TEE) (N/A) Subjective: Some incisional pain, but overall feels better Walked 3 laps around unit yesterday  Objective: Vital signs in last 24 hours: Temp:  [97.9 F (36.6 C)-99.1 F (37.3 C)] 98.2 F (36.8 C) (02/12 0810) Pulse Rate:  [26-115] 104 (02/12 0800) Cardiac Rhythm:  [-] Sinus tachycardia (02/12 0800) Resp:  [9-21] 18 (02/12 0800) BP: (104-137)/(77-93) 117/89 mmHg (02/12 0800) SpO2:  [85 %-100 %] 98 % (02/12 0800) Weight:  [197 lb 12 oz (89.7 kg)] 197 lb 12 oz (89.7 kg) (02/12 0600)  Hemodynamic parameters for last 24 hours:    Intake/Output from previous day: 02/11 0701 - 02/12 0700 In: 1006.4 [P.O.:240; I.V.:566.4; IV Piggyback:200] Out: 2980 [Urine:2980] Intake/Output this shift: Total I/O In: 70 [I.V.:20; IV Piggyback:50] Out: 30 [Urine:30]  General appearance: alert, cooperative and no distress Neurologic: intact Heart: tahcy, regular Lungs: diminished breath sounds bibasilar Wound: clean and dry  Lab Results:  Recent Labs  08/05/15 0425 08/06/15 0444  WBC 9.0 5.9  HGB 9.3* 9.0*  HCT 28.8* 28.3*  PLT 94* 90*   BMET:  Recent Labs  08/05/15 0425 08/06/15 0444  NA 136 137  K 3.5 3.1*  CL 95* 96*  CO2 31 32  GLUCOSE 120* 109*  BUN 13 16  CREATININE 0.78 0.82  CALCIUM 7.9* 8.0*    PT/INR: No results for input(s): LABPROT, INR in the last 72 hours. ABG    Component Value Date/Time   PHART 7.430 08/03/2015 0356   HCO3 27.4* 08/03/2015 0356   TCO2 27 08/03/2015 1532   ACIDBASEDEF 2.0 08/01/2015 1915   O2SAT 58.6 08/04/2015 1939   CBG (last 3)   Recent Labs  08/05/15 1214 08/05/15 1645 08/05/15 2127  GLUCAP  92 115* 100*    Assessment/Plan: S/P Procedure(s) (LRB): REDO CORONARY ARTERY BYPASS GRAFTING x two (CABG) x   using left radial artery, and left leg greater saphenous vein harvested endoscopically, also performed coronary endarderectomy. Placement of Intra aortic balloon pump (IAB). (N/A) RADIAL ARTERY HARVEST (Left) TRANSESOPHAGEAL ECHOCARDIOGRAM (TEE) (N/A) Plan for transfer to step-down: see transfer orders  Continues to progress well CV- stable, on Imdur for radial graft  RESP- continue IS  RENAL- hypokalemia- supplement K  Still volume overloaded but has diuresed well- chang to PO lasix  ENDO- CBG well controlled, dc levemir and meal coverage  Continue ambulation  Thrombocytopenia- stable   LOS: 5 days    Melrose Nakayama 08/06/2015

## 2015-08-07 ENCOUNTER — Inpatient Hospital Stay (HOSPITAL_COMMUNITY): Payer: BLUE CROSS/BLUE SHIELD

## 2015-08-07 LAB — GLUCOSE, CAPILLARY
Glucose-Capillary: 125 mg/dL — ABNORMAL HIGH (ref 65–99)
Glucose-Capillary: 114 mg/dL — ABNORMAL HIGH (ref 65–99)
Glucose-Capillary: 126 mg/dL — ABNORMAL HIGH (ref 65–99)
Glucose-Capillary: 97 mg/dL (ref 65–99)

## 2015-08-07 LAB — COMPREHENSIVE METABOLIC PANEL
ALT: 31 U/L (ref 17–63)
ANION GAP: 11 (ref 5–15)
AST: 48 U/L — AB (ref 15–41)
Albumin: 2.5 g/dL — ABNORMAL LOW (ref 3.5–5.0)
Alkaline Phosphatase: 52 U/L (ref 38–126)
BILIRUBIN TOTAL: 1 mg/dL (ref 0.3–1.2)
BUN: 17 mg/dL (ref 6–20)
CHLORIDE: 95 mmol/L — AB (ref 101–111)
CO2: 30 mmol/L (ref 22–32)
Calcium: 8.2 mg/dL — ABNORMAL LOW (ref 8.9–10.3)
Creatinine, Ser: 0.84 mg/dL (ref 0.61–1.24)
GFR calc Af Amer: >60 mL/min (ref >60)
Glucose, Bld: 100 mg/dL — ABNORMAL HIGH (ref 65–99)
POTASSIUM: 3.7 mmol/L (ref 3.5–5.1)
Sodium: 136 mmol/L (ref 135–145)
TOTAL PROTEIN: 5.4 g/dL — AB (ref 6.5–8.1)

## 2015-08-07 LAB — CBC
HEMATOCRIT: 27.9 % — AB (ref 39.0–52.0)
Hemoglobin: 9.1 g/dL — ABNORMAL LOW (ref 13.0–17.0)
MCH: 31.2 pg (ref 26.0–34.0)
MCHC: 32.6 g/dL (ref 30.0–36.0)
MCV: 95.5 fL (ref 78.0–100.0)
PLATELETS: 125 10*3/uL — AB (ref 150–400)
RBC: 2.92 MIL/uL — ABNORMAL LOW (ref 4.22–5.81)
RDW: 15.7 % — AB (ref 11.5–15.5)
WBC: 6.8 10*3/uL (ref 4.0–10.5)

## 2015-08-07 MED ORDER — POTASSIUM CHLORIDE CRYS ER 20 MEQ PO TBCR
30.0000 meq | EXTENDED_RELEASE_TABLET | Freq: Two times a day (BID) | ORAL | Status: DC
  Administered 2015-08-07 – 2015-08-08 (×3): 30 meq via ORAL
  Filled 2015-08-07 (×3): qty 1

## 2015-08-07 MED ORDER — DIGOXIN 125 MCG PO TABS
0.2500 mg | ORAL_TABLET | Freq: Every day | ORAL | Status: DC
  Administered 2015-08-07 – 2015-08-08 (×2): 0.25 mg via ORAL
  Filled 2015-08-07 (×2): qty 2

## 2015-08-07 MED FILL — Magnesium Sulfate Inj 50%: INTRAMUSCULAR | Qty: 10 | Status: AC

## 2015-08-07 MED FILL — Heparin Sodium (Porcine) Inj 1000 Unit/ML: INTRAMUSCULAR | Qty: 30 | Status: AC

## 2015-08-07 MED FILL — Potassium Chloride Inj 2 mEq/ML: INTRAVENOUS | Qty: 40 | Status: AC

## 2015-08-07 NOTE — Progress Notes (Signed)
CARDIAC REHAB PHASE I   PRE:  Rate/Rhythm: 93 SR    BP: sitting 120/83    SaO2: 92 RA  MODE:  Ambulation: 550 ft   POST:  Rate/Rhythm: 129 ST    BP: sitting 124/77     SaO2: 96 RA   Pt moving well, needed slight assist to get out of bed. HR elevated with walking. Tired after walk. Had to hurry back to bathroom for diarrhea. Sts he has had diarrhea after walking past 3-4 walks. Pt is concerned that he is still having diarrhea. To bed independently. Will f/u tomorrow. [id number] Josephina Shih [facility] CES, ACSM 08/07/2015 1:43 PM

## 2015-08-07 NOTE — Progress Notes (Signed)
Utilization review completed.  

## 2015-08-07 NOTE — Progress Notes (Signed)
[address]       [place] [address]             [phone]      6 Days Post-Op Procedure(s) (LRB): REDO CORONARY ARTERY BYPASS GRAFTING x two (CABG) x   using left radial artery, and left leg greater saphenous vein harvested endoscopically, also performed coronary endarderectomy. Placement of Intra aortic balloon pump (IAB). (N/A) RADIAL ARTERY HARVEST (Left) TRANSESOPHAGEAL ECHOCARDIOGRAM (TEE) (N/A) Subjective: Rapid afib overnight, currently sinus tachy. Some diarrhea after laxatives. Overall progressing well with ambulation  Objective: Vital signs in last 24 hours: Temp:  [97.7 F (36.5 C)-98.2 F (36.8 C)] 97.8 F (36.6 C) (02/13 0540) Pulse Rate:  [88-120] 120 (02/13 0540) Cardiac Rhythm:  [-] Sinus tachycardia;Other (Comment) (02/13 0529) Resp:  [15-18] 18 (02/13 0540) BP: (112-136)/(78-95) 136/88 mmHg (02/13 0540) SpO2:  [93 %-100 %] 93 % (02/13 0540) Weight:  [204 lb 6.4 oz (92.715 kg)] 204 lb 6.4 oz (92.715 kg) (02/13 0533)  Hemodynamic parameters for last 24 hours:    Intake/Output from previous day: 02/12 0701 - 02/13 0700 In: 250 [I.V.:100; IV Piggyback:150] Out: 230 [Urine:230] Intake/Output this shift:    General appearance: alert, cooperative and no distress Heart: regular rate and rhythm and tachy Lungs: clear to auscultation bilaterally Abdomen: soft, non-tender Extremities: some edema left LE Wound: incis healing well, mod echymosis left arm and thigh, left hand is N/V intact  Lab Results:  Recent Labs  08/06/15 0444 08/07/15 0209  WBC 5.9 6.8  HGB 9.0* 9.1*  HCT 28.3* 27.9*  PLT 90* 125*   BMET:  Recent Labs  08/06/15 0444 08/07/15 0209  NA 137 136  K 3.1* 3.7  CL 96* 95*  CO2 32 30  GLUCOSE 109* 100*  BUN 16 17  CREATININE 0.82 0.84  CALCIUM 8.0* 8.2*    PT/INR: No results for input(s): LABPROT, INR in the last 72 hours. ABG    Component Value Date/Time   PHART 7.430 08/03/2015 0356   HCO3 27.4*  08/03/2015 0356   TCO2 27 08/03/2015 1532   ACIDBASEDEF 2.0 08/01/2015 1915   O2SAT 58.6 08/04/2015 1939   CBG (last 3)   Recent Labs  08/06/15 1656 08/06/15 2102 08/07/15 0549  GLUCAP 115* 98 125*    Meds Scheduled Meds: . ALPRAZolam  0.5 mg Oral QHS  . amiodarone  400 mg Oral BID  . aspirin EC  81 mg Oral Daily  . bisacodyl  10 mg Oral Daily   Or  . bisacodyl  10 mg Rectal Daily  . digoxin  0.125 mg Oral Daily  . docusate sodium  200 mg Oral Daily  . furosemide  40 mg Oral Daily  . insulin aspart  0-15 Units Subcutaneous TID WC  . isosorbide mononitrate  15 mg Oral Daily  . metoprolol tartrate  25 mg Oral BID  . pantoprazole  40 mg Oral Daily  . potassium chloride  20 mEq Oral BID  . sodium chloride flush  3 mL Intravenous Q12H   Continuous Infusions:  PRN Meds:.sodium chloride, alum & mag hydroxide-simeth, magnesium hydroxide, metoprolol, ondansetron (ZOFRAN) IV, oxyCODONE, sodium chloride flush, traMADol, zolpidem  Xrays No results found.  Assessment/Plan: S/P Procedure(s) (LRB): REDO CORONARY ARTERY BYPASS GRAFTING x two (CABG) x   using left radial artery, and left leg greater saphenous vein harvested endoscopically, also performed coronary endarderectomy. Placement of Intra aortic balloon pump (IAB). (N/A) RADIAL ARTERY HARVEST (Left) TRANSESOPHAGEAL ECHOCARDIOGRAM (TEE) (N/A)  1 afib- now in sinus rhythm(tachy) on amio/dig/lopressor, will increase dig to .25 with normal creat Check level in next few days. Replace K+ to >4 2 cont pulm toilet/rehab 3 imdur for radial 4 cbg's controlled  LOS: 6 days    GOLD,WAYNE E 08/07/2015

## 2015-08-07 NOTE — Progress Notes (Signed)
Epicardial pacing wires removed.  Pt tol well.  Wires intact. Pt educated on one hour bed rest.  Will monitor BP and pulse q 15 minutes for one hour.  Pt denies chest pain.

## 2015-08-08 LAB — BASIC METABOLIC PANEL
Anion gap: 13 (ref 5–15)
BUN: 18 mg/dL (ref 6–20)
CHLORIDE: 95 mmol/L — AB (ref 101–111)
CO2: 28 mmol/L (ref 22–32)
Calcium: 8.5 mg/dL — ABNORMAL LOW (ref 8.9–10.3)
Creatinine, Ser: 0.9 mg/dL (ref 0.61–1.24)
GFR calc Af Amer: >60 mL/min (ref >60)
GFR calc non Af Amer: >60 mL/min (ref >60)
GLUCOSE: 104 mg/dL — AB (ref 65–99)
POTASSIUM: 4.3 mmol/L (ref 3.5–5.1)
Sodium: 136 mmol/L (ref 135–145)

## 2015-08-08 LAB — POCT I-STAT 3, ART BLOOD GAS (G3+)
Bicarbonate: 24.2 mEq/L — ABNORMAL HIGH (ref 20.0–24.0)
O2 Saturation: 95 %
Patient temperature: 36.9
TCO2: 25 mmol/L (ref 0–100)
pCO2 arterial: 35.2 mmHg (ref 35.0–45.0)
pH, Arterial: 7.444 (ref 7.350–7.450)
pO2, Arterial: 70 mmHg — ABNORMAL LOW (ref 80.0–100.0)

## 2015-08-08 LAB — GLUCOSE, CAPILLARY: Glucose-Capillary: 131 mg/dL — ABNORMAL HIGH (ref 65–99)

## 2015-08-08 MED ORDER — METOPROLOL TARTRATE 25 MG PO TABS: 25.0000 mg | ORAL_TABLET | Freq: Two times a day (BID) | ORAL | Status: DC

## 2015-08-08 MED ORDER — DIGOXIN 250 MCG PO TABS: 0.2500 mg | ORAL_TABLET | Freq: Every day | ORAL | Status: DC

## 2015-08-08 MED ORDER — OXYCODONE HCL 5 MG PO TABS: 5.0000 mg | ORAL_TABLET | ORAL | Status: DC | PRN

## 2015-08-08 MED ORDER — ASPIRIN 325 MG PO TBEC: 325.0000 mg | DELAYED_RELEASE_TABLET | Freq: Every day | ORAL | Status: DC

## 2015-08-08 MED ORDER — POTASSIUM CHLORIDE CRYS ER 20 MEQ PO TBCR: 30.0000 meq | EXTENDED_RELEASE_TABLET | Freq: Every day | ORAL | Status: DC

## 2015-08-08 MED ORDER — ISOSORBIDE MONONITRATE ER 30 MG PO TB24: 15.0000 mg | ORAL_TABLET | Freq: Every day | ORAL | Status: DC

## 2015-08-08 MED ORDER — LISINOPRIL 5 MG PO TABS: 5.0000 mg | ORAL_TABLET | Freq: Every day | ORAL | Status: DC

## 2015-08-08 MED ORDER — FUROSEMIDE 40 MG PO TABS: 40.0000 mg | ORAL_TABLET | Freq: Every day | ORAL | Status: DC

## 2015-08-08 MED ORDER — AMIODARONE HCL 400 MG PO TABS: 400.0000 mg | ORAL_TABLET | Freq: Two times a day (BID) | ORAL | Status: DC

## 2015-08-08 NOTE — Progress Notes (Signed)
CARDIAC REHAB PHASE I   Ed completed with good reception. Eager to do CRPII and will send referral to [facility]. Pt has been walking independently without problems.  [id number]   Josephina Shih [place] CES, ACSM 08/08/2015 10:04 AM

## 2015-08-08 NOTE — Progress Notes (Signed)
[address]       [place] [address]             [phone]      7 Days Post-Op Procedure(s) (LRB): REDO CORONARY ARTERY BYPASS GRAFTING x two (CABG) x   using left radial artery, and left leg greater saphenous vein harvested endoscopically, also performed coronary endarderectomy. Placement of Intra aortic balloon pump (IAB). (N/A) RADIAL ARTERY HARVEST (Left) TRANSESOPHAGEAL ECHOCARDIOGRAM (TEE) (N/A) Subjective: Feels well  Objective: Vital signs in last 24 hours: Temp:  [97.5 F (36.4 C)-98.5 F (36.9 C)] 97.5 F (36.4 C) (02/14 0224) Pulse Rate:  [94-117] 117 (02/14 0224) Cardiac Rhythm:  [-] Sinus tachycardia (02/13 2200) Resp:  [18-20] 20 (02/14 0224) BP: (92-126)/(55-84) 117/82 mmHg (02/14 0224) SpO2:  [95 %] 95 % (02/14 0224) Weight:  [204 lb 6.4 oz (92.715 kg)] 204 lb 6.4 oz (92.715 kg) (02/14 0224)  Hemodynamic parameters for last 24 hours:    Intake/Output from previous day: 02/13 0701 - 02/14 0700 In: 120 [P.O.:120] Out: -  Intake/Output this shift:    General appearance: alert, cooperative and no distress Heart: regular rate and rhythm Lungs: clear to auscultation bilaterally Abdomen: benign Extremities: + edema Wound: incis healing well, L arm is N/V intact  Lab Results:  Recent Labs  08/06/15 0444 08/07/15 0209  WBC 5.9 6.8  HGB 9.0* 9.1*  HCT 28.3* 27.9*  PLT 90* 125*   BMET:  Recent Labs  08/07/15 0209 08/08/15 0212  NA 136 136  K 3.7 4.3  CL 95* 95*  CO2 30 28  GLUCOSE 100* 104*  BUN 17 18  CREATININE 0.84 0.90  CALCIUM 8.2* 8.5*    PT/INR: No results for input(s): LABPROT, INR in the last 72 hours. ABG    Component Value Date/Time   PHART 7.430 08/03/2015 0356   HCO3 27.4* 08/03/2015 0356   TCO2 27 08/03/2015 1532   ACIDBASEDEF 2.0 08/01/2015 1915   O2SAT 58.6 08/04/2015 1939   CBG (last 3)   Recent Labs  08/07/15 1645 08/07/15 2055 08/08/15 0647  GLUCAP 126* 97 131*     Meds Scheduled Meds: . ALPRAZolam  0.5 mg Oral QHS  . amiodarone  400 mg Oral BID  . aspirin EC  81 mg Oral Daily  . bisacodyl  10 mg Oral Daily   Or  . bisacodyl  10 mg Rectal Daily  . digoxin  0.25 mg Oral Daily  . docusate sodium  200 mg Oral Daily  . furosemide  40 mg Oral Daily  . insulin aspart  0-15 Units Subcutaneous TID WC  . isosorbide mononitrate  15 mg Oral Daily  . metoprolol tartrate  25 mg Oral BID  . pantoprazole  40 mg Oral Daily  . potassium chloride  30 mEq Oral BID  . sodium chloride flush  3 mL Intravenous Q12H   Continuous Infusions:  PRN Meds:.sodium chloride, alum & mag hydroxide-simeth, magnesium hydroxide, metoprolol, ondansetron (ZOFRAN) IV, oxyCODONE, sodium chloride flush, traMADol, zolpidem  Xrays Dg Chest 2 View  08/07/2015  CLINICAL DATA:  Status post CABG 08/01/2015. EXAM: CHEST  2 VIEW COMPARISON:  Single view of the chest 08/05/2015. FINDINGS: Right IJ sheath has been removed. There are very small bilateral pleural effusions and mild basilar atelectasis. Heart size is upper normal. No pneumothorax is identified. Median sternotomy wires are intact and unchanged in appearance. IMPRESSION: Very small bilateral pleural effusions and associated mild bibasilar atelectasis. Status post removal of right  IJ sheath. Electronically Signed   By: Inge Rise M.D.   On: 08/07/2015 07:45    Assessment/Plan: S/P Procedure(s) (LRB): REDO CORONARY ARTERY BYPASS GRAFTING x two (CABG) x   using left radial artery, and left leg greater saphenous vein harvested endoscopically, also performed coronary endarderectomy. Placement of Intra aortic balloon pump (IAB). (N/A) RADIAL ARTERY HARVEST (Left) TRANSESOPHAGEAL ECHOCARDIOGRAM (TEE) (N/A)  1 maintaining sinus rhythm on current rx 2 appears stable for d/c   LOS: 7 days    GOLD,WAYNE E 08/08/2015

## 2015-08-08 NOTE — Progress Notes (Signed)
Discharge instructions reviewed with pt and family.  They express understanding of importance of adhering to medication regimen and instructions specific to healthcare needs.  They are aware of need for f/u and intend to keep/make recommended appts.  Pt discharged to home with significant other.

## 2015-08-08 NOTE — Discharge Summary (Signed)
Physician Discharge Summary  Patient ID: Manuel Bell MRN: [id number] DOB/AGE: 10/10/1955 60 y.o.  Admit date: 08/01/2015 Discharge date: 08/08/2015  Admission Diagnoses: Recurrent coronary artery disease  Discharge Diagnoses:  Active Problems:   S/P CABG x 2  Patient Active Problem List   Diagnosis Date Noted  . S/P CABG x 2 08/01/2015  . Coronary artery disease involving native coronary artery of native heart with unstable angina pectoris ([place])   . Hepatitis C   . Hyperlipidemia   . CAD (coronary artery disease)   . Alcohol abuse   . Complication of anesthesia   . PONV (postoperative nausea and vomiting)   . Family history of anesthesia complication   . Dysrhythmia   . Asthma   . GERD (gastroesophageal reflux disease)   . Other abnormal glucose 01/07/2012  . Coronary atherosclerosis of native coronary artery 10/02/2011  . S/P CABG x 5 09/24/2011  . Hx of CABG 08/23/2011  . Postoperative atrial fibrillation ([place]) 08/23/2011  . LUMBAR RADICULOPATHY, RIGHT 01/02/2010  . HYPERLIPIDEMIA 11/22/2009  . NEURALGIA 11/22/2009  . GERD 10/19/2008  . HYPERTENSION, ESSENTIAL NOS 08/20/2007  . ELEVATION, TRANSAMINASE/LDH LEVELS 02/06/2007  . CARRIER, VIRAL HEPATITIS C 01/28/2007    History of present illness: At time of consultation  The patient is a very nice 60 year old Caucasian male with family history of CAD, hypertension, hyperlipidemia as well as past history of hepatitis C, cirrhosis, and portal hypertension.also past history of alcohol and tobacco abuse.4 years ago he had CABG x5 for presentation with unstable angina.left IMA to LAD, vein graft to diagonal, sequential vein graft to ramus intermediate and circumflex marginal and vein graft to distal RCA posterior lateral. He did well until 2 months ago when he developed recurrent symptoms of exertional angina. Cardiac catheterization was performed. This demonstrated patent left IMA to LAD, patent vein graft to diagonal, occluded  saphenous vein graft to a small posterior lateral but otherwise normal right coronary and included sequential vein graft to the circumflex system. It appears that the culprit vessels are in the circumflex system and he would benefit from redo CABG. His cardiologist does not feel that percutaneous be safe due to the heavily calcified coronary vesselsand high-grade left main stenosis The patient was placed on Plavix at the time of the catheterization in case he was turned down for redo CABG. He still taking Plavix. He is not have any rest pain. He denies symptoms of CHF. His ejection fraction is fairly well preserved and is LVEDP was normal.  It appears the patient would benefit from redo CABG with left radial artery graft to the large OM1 and a saphenous vein graft to the distal circumflex. The right coronary does not need a graft.surgery will be scheduled in a week to allow Plavix washout.  The patient currently does not smoke. He takes pravastatin with fairly well controlled lipid panel. He is not overweight and he tries to exercise radially although this has tailed off the past couple of years.  He was admitted this hospitalization for the procedure.   Discharged Condition: good  Hospital Course: The patient was admitted and taken to the operating room where he underwent the below described procedure. He tolerated it well but required multiple pressors and intra-aortic balloon pump for hemodynamic support. He was taken to the surgical intensive care unit in critical condition. Postoperatively he did have a coagulopathy which was treated aggressively with replacement products as well as packed blood cells. He was atrially paced in addition to the  pressor support agents. These ages were able to be weaned over time as was the balloon pump. He was extubated from the ventilator without significant difficulties. His postoperative thrombocytopenia has shown a gradual improvement. He had a postoperative  volume overload but responded well to diuretics which will continue as an outpatient. He did have postoperative atrial fibrillation which is subsequently been chemically Cardioverted with amiodarone as well as digoxin and beta blocker. He is currently maintaining sinus rhythm. Incisions are noted to be healing well without evidence of infection. He is tolerating gradually increasing activities using standard protocols. He has been started on Imdur or for radial artery harvest spasm prevention. His overall status is felt to be acceptable for discharge on today's date.  Consults: None  Significant Diagnostic Studies: labs: routine post op and radiology: routine serial chest xrays  Treatments: surgery:   DATE OF PROCEDURE: 08/01/2015 DATE OF DISCHARGE:   OPERATIVE REPORT   OPERATION: 1. Redo coronary artery bypass graft x2 (left radial artery free graft  to ramus intermedius, saphenous vein graft to circumflex  posterolateral). 2. Endoscopic harvest of left leg greater saphenous vein. 3. Harvest of left radial free graft. 4. Coronary endarterectomy of ramus intermedius. 5. Placement of intra-aortic balloon pump, right femoral artery.  PREOPERATIVE DIAGNOSIS: Recurrent severe coronary artery disease with suboptimal targets for grafting.  POSTOPERATIVE DIAGNOSIS: Recurrent severe coronary artery disease with suboptimal targets for grafting.  SURGEON: Ivin Poot, M.D.  ASSISTANT: John Giovanni, P.A.-C.  ANESTHESIA: General by Dr. Tobias Alexander.   Discharge Exam: Blood pressure 117/82, pulse 117, temperature 97.5 F (36.4 C), temperature source Oral, resp. rate 20, height 5\' 8"  (1.727 m), weight 204 lb 6.4 oz (92.715 kg), SpO2 95 %.  General appearance: alert, cooperative and no distress Heart: regular rate and rhythm Lungs: clear to auscultation bilaterally Abdomen: benign Extremities: + edema Wound: incis healing well, L arm is N/V  intact   Disposition: 01-Home or Self Care     Medication List    STOP taking these medications        aspirin 81 MG tablet  Replaced by:  aspirin 325 MG EC tablet     clopidogrel 75 MG tablet  Commonly known as:  PLAVIX     hydrochlorothiazide 25 MG tablet  Commonly known as:  HYDRODIURIL     naproxen sodium 220 MG tablet  Commonly known as:  ANAPROX     nitroGLYCERIN 0.4 MG SL tablet  Commonly known as:  NITROSTAT      TAKE these medications        acyclovir ointment 5 %  Commonly known as:  ZOVIRAX  Apply 1 application topically as needed (for fever blisters).     ALPRAZolam 0.5 MG tablet  Commonly known as:  XANAX  Take 0.25 mg by mouth at bedtime as needed.     amiodarone 400 MG tablet  Commonly known as:  PACERONE  Take 1 tablet (400 mg total) by mouth 2 (two) times daily. For 7 days then take 400 mg once daily     aspirin 325 MG EC tablet  Take 1 tablet (325 mg total) by mouth daily.     digoxin 0.25 MG tablet  Commonly known as:  LANOXIN  Take 1 tablet (0.25 mg total) by mouth daily.     Fish Oil 1200 MG Caps  Take 1,200 mg by mouth 2 (two) times daily.     furosemide 40 MG tablet  Commonly known as:  LASIX  Take 1 tablet (  40 mg total) by mouth daily.     isosorbide mononitrate 30 MG 24 hr tablet  Commonly known as:  IMDUR  Take 0.5 tablets (15 mg total) by mouth daily.     lansoprazole 30 MG capsule  Commonly known as:  PREVACID  Take 1 capsule (30 mg total) by mouth daily.     lisinopril 5 MG tablet  Commonly known as:  ZESTRIL  Take 1 tablet (5 mg total) by mouth daily.     metoprolol tartrate 25 MG tablet  Commonly known as:  LOPRESSOR  Take 1 tablet (25 mg total) by mouth 2 (two) times daily.     oxyCODONE 5 MG immediate release tablet  Commonly known as:  Oxy IR/ROXICODONE  Take 1-2 tablets (5-10 mg total) by mouth every 4 (four) hours as needed for severe pain.     potassium chloride SA 20 MEQ tablet  Commonly known as:   K-DUR,KLOR-CON  Take 1.5 tablets (30 mEq total) by mouth daily.     pravastatin 40 MG tablet  Commonly known as:  PRAVACHOL  Take 1 tablet (40 mg total) by mouth every evening.     zolpidem 10 MG tablet  Commonly known as:  AMBIEN  TAKE 1 TABLET BY MOUTH EVERY 3RD NIGHT *ONLY AS NEEDED *           Follow-up Information    Follow up with Len Childs, MD.   Specialty:  Cardiothoracic Surgery   Why:  He also contact you his appointment to see the surgeon as well as a nurse appointment or staple removal.   Contact information:   [address] [place] [address] [phone]       Follow up with Minus Breeding, MD.   Specialty:  Cardiology   Why:  The office will contact you for a 2 week appointment with the cardiologist. If they do not contact you in the next couple days, please call them to arrange.   Contact information:   [address] [place] [address] [phone]       Signed: John Giovanni 08/08/2015, 8:03 AM

## 2015-08-08 NOTE — Discharge Instructions (Signed)
Endoscopic Saphenous Vein Harvesting, Care After °Refer to this sheet in the next few weeks. These instructions provide you with information on caring for yourself after your procedure. Your health care provider may also give you more specific instructions. Your treatment has been planned according to current medical practices, but problems sometimes occur. Call your health care provider if you have any problems or questions after your procedure. °HOME CARE INSTRUCTIONS °Medicine °· Take whatever pain medicine your surgeon prescribes. Follow the directions carefully. Do not take over-the-counter pain medicine unless your surgeon says it is okay. Some pain medicine can cause bleeding problems for several weeks after surgery. °· Follow your surgeon's instructions about driving. You will probably not be permitted to drive after heart surgery. °· Take any medicines your surgeon prescribes. Any medicines you took before your heart surgery should be checked with your health care provider before you start taking them again. °Wound care °· If your surgeon has prescribed an elastic bandage or stocking, ask how long you should wear it. °· Check the area around your surgical cuts (incisions) whenever your bandages (dressings) are changed. Look for any redness or swelling. °· You will need to return to have the stitches (sutures) or staples taken out. Ask your surgeon when to do that. °· Ask your surgeon when you can shower or bathe. °Activity °· Try to keep your legs raised when you are sitting. °· Do any exercises your health care providers have given you. These may include deep breathing exercises, coughing, walking, or other exercises. °SEEK MEDICAL CARE IF: °· You have any questions about your medicines. °· You have more leg pain, especially if your pain medicine stops working. °· New or growing bruises develop on your leg. °· Your leg swells, feels tight, or becomes red. °· You have numbness in your leg. °SEEK IMMEDIATE  MEDICAL CARE IF: °· Your pain gets much worse. °· Blood or fluid leaks from any of the incisions. °· Your incisions become warm, swollen, or red. °· You have chest pain. °· You have trouble breathing. °· You have a fever. °· You have more pain near your leg incision. °MAKE SURE YOU: °· Understand these instructions. °· Will watch your condition. °· Will get help right away if you are not doing well or get worse. °  °This information is not intended to replace advice given to you by your health care provider. Make sure you discuss any questions you have with your health care provider. °  °Document Released: 02/20/2011 Document Revised: 07/01/2014 Document Reviewed: 02/20/2011 °Elsevier Interactive Patient Education ©2016 [employer] °Coronary Artery Bypass Grafting, Care After °These instructions give you information on caring for yourself after your procedure. Your doctor may also give you more specific instructions. Call your doctor if you have any problems or questions after your procedure.  °HOME CARE °· Only take medicine as told by your doctor. Take medicines exactly as told. Do not stop taking medicines or start any new medicines without talking to your doctor first. °· Take your pulse as told by your doctor. °· Do deep breathing as told by your doctor. Use your breathing device (incentive spirometer), if given, to practice deep breathing several times a day. Support your chest with a pillow or your arms when you take deep breaths or cough. °· Keep the area clean, dry, and protected where the surgery cuts (incisions) were made. Remove bandages (dressings) only as told by your doctor. If strips were applied to surgical area, do not take   them off. They fall off on their own. °· Check the surgery area daily for puffiness (swelling), redness, or leaking fluid. °· If surgery cuts were made in your legs: °· Avoid crossing your legs. °· Avoid sitting for long periods of time. Change positions every 30  minutes. °· Raise your legs when you are sitting. Place them on pillows. °· Wear stockings that help keep blood clots from forming in your legs (compression stockings). °· Only take sponge baths until your doctor says it is okay to take showers. Pat the surgery area dry. Do not rub the surgery area with a washcloth or towel. Do not bathe, swim, or use a hot tub until your doctor says it is okay. °· Eat foods that are high in fiber. These include raw fruits and vegetables, whole grains, beans, and nuts. Choose lean meats. Avoid canned, processed, and fried foods. °· Drink enough fluids to keep your pee (urine) clear or pale yellow. °· Weigh yourself every day. °· Rest and limit activity as told by your doctor. You may be told to: °· Stop any activity if you have chest pain, shortness of breath, changes in heartbeat, or dizziness. Get help right away if this happens. °· Move around often for short amounts of time or take short walks as told by your doctor. Gradually become more active. You may need help to strengthen your muscles and build endurance. °· Avoid lifting, pushing, or pulling anything heavier than 10 pounds (4.5 kg) for at least 6 weeks after surgery. °· Do not drive until your doctor says it is okay. °· Ask your doctor when you can go back to work. °· Ask your doctor when you can begin sexual activity again. °· Follow up with your doctor as told. °GET HELP IF: °· You have puffiness, redness, more pain, or fluid draining from the incision site. °· You have a fever. °· You have puffiness in your ankles or legs. °· You have pain in your legs. °· You gain 2 or more pounds (0.9 kg) a day. °· You feel sick to your stomach (nauseous) or throw up (vomit). °· You have watery poop (diarrhea). °GET HELP RIGHT AWAY IF: °· You have chest pain that goes to your jaw or arms. °· You have shortness of breath. °· You have a fast or irregular heartbeat. °· You notice a "clicking" in your breastbone when you move. °· You  have numbness or weakness in your arms or legs. °· You feel dizzy or light-headed. °MAKE SURE YOU: °· Understand these instructions. °· Will watch your condition. °· Will get help right away if you are not doing well or get worse. °  °This information is not intended to replace advice given to you by your health care provider. Make sure you discuss any questions you have with your health care provider. °  °Document Released: 06/15/2013 Document Reviewed: 06/15/2013 °Elsevier Interactive Patient Education ©2016 Elsevier Inc. ° °

## 2015-08-08 NOTE — Progress Notes (Signed)
Chest tube sutures removed and sites covered with steri-strips.  Pt tolerated well.  IV and tele discontinued as well.  Pt awaiting family to arrive prior to receiving discharge instructions.

## 2015-08-08 NOTE — Care Management Note (Signed)
Case Management Note Previous CM note initiated by Elenor Quinones RN, CM  Patient Details  Name: Manuel Bell MRN: [id number] Date of Birth: [birth year]  Subjective/Objective:  Pt is s/p CABG                   Action/Plan:  Pt is independent from home alone.  Post discharge pt will have 24 hour supervision provided by sister and friends.  CM will continue to monitor for discharge needs   Expected Discharge Date:     08/08/15             Expected Discharge Plan:  Home/Self Care  In-House Referral:     Discharge planning Services  CM Consult  Post Acute Care Choice:    Choice offered to:     DME Arranged:    DME Agency:     HH Arranged:    [place] Agency:     Status of Service:  Completed, signed off  Medicare Important Message Given:    Date Medicare IM Given:    Medicare IM give by:    Date Additional Medicare IM Given:    Additional Medicare Important Message give by:     If discussed at [place] of Stay Meetings, dates discussed:    Discharge Disposition: Home/self care   Additional Comments:  Dawayne Patricia, RN 08/08/2015, 10:06 AM

## 2015-08-09 NOTE — Telephone Encounter (Signed)
Spoke with [name], questions answered.

## 2015-08-10 ENCOUNTER — Other Ambulatory Visit: Payer: Self-pay | Admitting: *Deleted

## 2015-08-10 DIAGNOSIS — R600 Localized edema: Secondary | ICD-10-CM

## 2015-08-10 MED ORDER — POTASSIUM CHLORIDE CRYS ER 20 MEQ PO TBCR: 20.0000 meq | EXTENDED_RELEASE_TABLET | Freq: Every day | ORAL | Status: DC

## 2015-08-10 MED ORDER — FUROSEMIDE 40 MG PO TABS: 40.0000 mg | ORAL_TABLET | Freq: Every day | ORAL | Status: DC

## 2015-08-10 NOTE — Progress Notes (Signed)
Manuel Bell caregiver has called with questions regarding two of his meds.Marland KitchenMarland KitchenLasik and K...and whether they need refilling.  One was for only 7 days. The discharge instructions were rather confusing. He still has edema to his ankles and feet even with elevation. Both meds were renewed and she understood.

## 2015-08-14 ENCOUNTER — Other Ambulatory Visit: Payer: Self-pay | Admitting: *Deleted

## 2015-08-14 DIAGNOSIS — G8918 Other acute postprocedural pain: Secondary | ICD-10-CM

## 2015-08-14 LAB — CARBOXYHEMOGLOBIN

## 2015-08-14 MED ORDER — OXYCODONE HCL 5 MG PO TABS: 5.0000 mg | ORAL_TABLET | ORAL | Status: DC | PRN

## 2015-08-14 NOTE — Telephone Encounter (Signed)
Manuel Bell was discharged on 08/08/15 after REDO CABG.  He has called requesting a refill for Oxycodone.  I informed him that a new signed script would be available at our office today and he agrees.

## 2015-08-15 ENCOUNTER — Encounter (INDEPENDENT_AMBULATORY_CARE_PROVIDER_SITE_OTHER): Payer: Self-pay

## 2015-08-15 DIAGNOSIS — Z951 Presence of aortocoronary bypass graft: Secondary | ICD-10-CM

## 2015-08-17 ENCOUNTER — Telehealth: Payer: Self-pay | Admitting: Cardiology

## 2015-08-17 ENCOUNTER — Encounter: Payer: Self-pay | Admitting: *Deleted

## 2015-08-17 NOTE — Telephone Encounter (Signed)
Received [facility] Attending Provider Statement completed by [facility] and sent to Dr. Angelena Form @ [address].  Forms were forwarded to Dr Percival Spanish to complete and sign.  Forms put in Dr Hochrein's correspondence 08/17/15. lp

## 2015-08-21 ENCOUNTER — Encounter: Payer: Self-pay | Admitting: Cardiology

## 2015-08-21 ENCOUNTER — Ambulatory Visit (INDEPENDENT_AMBULATORY_CARE_PROVIDER_SITE_OTHER): Payer: BLUE CROSS/BLUE SHIELD | Admitting: Cardiology

## 2015-08-21 ENCOUNTER — Other Ambulatory Visit: Payer: Self-pay | Admitting: *Deleted

## 2015-08-21 VITALS — BP 106/74 | HR 68 | Ht 69.0 in | Wt 182.3 lb

## 2015-08-21 DIAGNOSIS — G8918 Other acute postprocedural pain: Secondary | ICD-10-CM

## 2015-08-21 DIAGNOSIS — I10 Essential (primary) hypertension: Secondary | ICD-10-CM

## 2015-08-21 DIAGNOSIS — I25810 Atherosclerosis of coronary artery bypass graft(s) without angina pectoris: Secondary | ICD-10-CM | POA: Diagnosis not present

## 2015-08-21 DIAGNOSIS — I4891 Unspecified atrial fibrillation: Secondary | ICD-10-CM

## 2015-08-21 DIAGNOSIS — E785 Hyperlipidemia, unspecified: Secondary | ICD-10-CM

## 2015-08-21 DIAGNOSIS — B182 Chronic viral hepatitis C: Secondary | ICD-10-CM

## 2015-08-21 DIAGNOSIS — Z951 Presence of aortocoronary bypass graft: Secondary | ICD-10-CM

## 2015-08-21 DIAGNOSIS — I9789 Other postprocedural complications and disorders of the circulatory system, not elsewhere classified: Secondary | ICD-10-CM

## 2015-08-21 MED ORDER — OXYCODONE HCL 5 MG PO TABS: 5.0000 mg | ORAL_TABLET | Freq: Four times a day (QID) | ORAL | Status: DC | PRN

## 2015-08-21 MED ORDER — ALPRAZOLAM 0.5 MG PO TABS: 0.2500 mg | ORAL_TABLET | Freq: Every evening | ORAL | Status: DC | PRN

## 2015-08-21 MED ORDER — AMIODARONE HCL 400 MG PO TABS: 200.0000 mg | ORAL_TABLET | Freq: Every day | ORAL | Status: DC

## 2015-08-21 NOTE — Assessment & Plan Note (Signed)
In 2013 and Feb 2013- discharged on Amiodarone, NSR today in f/u

## 2015-08-21 NOTE — Assessment & Plan Note (Signed)
On low dose statin Rx 

## 2015-08-21 NOTE — Progress Notes (Signed)
08/21/2015 Manuel Bell   07/27/1955  [id number]  Primary Physician No PCP Per Patient Primary Cardiologist: Dr Percival Spanish  HPI:  60 y/o Caucasian male with a history of CABG in 2013. He was recently seen in the office with complaints of dyspnea similar to his pre CABG symptoms. It was decided to proceed to cath which was done 07/14/15. This revealed severe native vessel CAD s/p 5V CABG with 2/5 patent bypass grafts. PCI was not considered an option because of heavy calcification of the arteries. He was seen in consult by Dr Nils Pyle and re do CABG was scheduled for 08/01/15 after Plavix washout.  He had CABG x 2 with a LRA to RI, and SVG- PL. He tolerated this well. He did have post op PAF (as he did in 2013) and he was discharged on Amiodarone 400 mg BID till 2/22 when he decreased it to 400 mg daily. He is in the office today for follow up. He is still weak. He says he can't sleep, his wife says he needs something for anxiety. He denies any tachycardia or palpitations. He is in NSR on EKG.    Current Outpatient Prescriptions  Medication Sig Dispense Refill  . acyclovir ointment (ZOVIRAX) 5 % Apply 1 application topically as needed (for fever blisters).    . ALPRAZolam (XANAX) 0.5 MG tablet Take 0.5 tablets (0.25 mg total) by mouth at bedtime as needed. 20 tablet 0  . amiodarone (PACERONE) 400 MG tablet Take 0.5 tablets (200 mg total) by mouth daily. 30 tablet 1  . aspirin EC 325 MG EC tablet Take 1 tablet (325 mg total) by mouth daily.    . digoxin (LANOXIN) 0.25 MG tablet Take 1 tablet (0.25 mg total) by mouth daily. 30 tablet 1  . isosorbide mononitrate (IMDUR) 30 MG 24 hr tablet Take 0.5 tablets (15 mg total) by mouth daily. 16 tablet 1  . lansoprazole (PREVACID) 30 MG capsule Take 1 capsule (30 mg total) by mouth daily. 90 capsule 1  . lisinopril (PRINIVIL,ZESTRIL) 5 MG tablet Take 1 tablet (5 mg total) by mouth daily. 30 tablet 1  . metoprolol tartrate (LOPRESSOR) 25 MG tablet Take 1  tablet (25 mg total) by mouth 2 (two) times daily. 60 tablet 1  . Omega-3 Fatty Acids (FISH OIL) 1200 MG CAPS Take 1,200 mg by mouth 2 (two) times daily.     Marland Kitchen oxyCODONE (OXY IR/ROXICODONE) 5 MG immediate release tablet Take 1-2 tablets (5-10 mg total) by mouth every 6 (six) hours as needed for severe pain. 50 tablet 0  . pravastatin (PRAVACHOL) 40 MG tablet Take 1 tablet (40 mg total) by mouth every evening. 30 tablet 6  . zolpidem (AMBIEN) 10 MG tablet TAKE 1 TABLET BY MOUTH EVERY 3RD NIGHT *ONLY AS NEEDED * 30 tablet 0  . [DISCONTINUED] amLODipine (NORVASC) 10 MG tablet Take 1 tablet (10 mg total) by mouth daily. 90 tablet 0   No current facility-administered medications for this visit.    Allergies  Allergen Reactions  . Sulfa Antibiotics Anxiety    Feels like his head was going to pop off  . Tape Other (See Comments)    Bruising, Please use "paper" tape only.    Social History   Social History  . Marital Status: Single    Spouse Name: N/A  . Number of Children: N/A  . Years of Education: N/A   Occupational History  . Not on file.   Social History Main Topics  .  Smoking status: Former Smoker -- 1.00 packs/day for 30 years    Types: Cigarettes    Quit date: 09/10/2003  . Smokeless tobacco: Not on file     Comment: smoked 1971- 2005 (off intermittently 1.5 years), up to 1 ppd   . Alcohol Use: 6.0 oz/week    10 Glasses of wine per week  . Drug Use: Yes    Special: Marijuana     Comment: last time 01/2014- early  in August  . Sexual Activity: Not on file   Other Topics Concern  . Not on file   Social History Narrative   Lives alone.     Review of Systems: General: negative for chills, fever, night sweats or weight changes.  Cardiovascular: negative for chest pain, dyspnea on exertion, edema, orthopnea, palpitations, paroxysmal nocturnal dyspnea or shortness of breath Dermatological: negative for rash Respiratory: negative for cough or wheezing Urologic: negative  for hematuria Abdominal: negative for nausea, vomiting, diarrhea, bright red blood per rectum, melena, or hematemesis Neurologic: negative for visual changes, syncope, or dizziness All other systems reviewed and are otherwise negative except as noted above.    Blood pressure 106/74, pulse 68, height 5\' 9"  (1.753 m), weight 182 lb 5 oz (82.696 kg).  General appearance: alert, cooperative and no distress Neck: no carotid bruit and no JVD Lungs: clear to auscultation bilaterally Heart: regular rate and rhythm Extremities: extremities normal, atraumatic, no cyanosis or edema Skin: Skin color, texture, turgor normal. No rashes or lesions Neurologic: Grossly normal  EKG NSR  ASSESSMENT AND PLAN:   S/P CABG x 5 2013 with re CABG x 27 Jul 2015 09/12/2011- L-LAD, S-Dx1, S-OM1/CFX, S-PDA 08/01/2015- CABG x 2, LRA-RI, S-PL EF normal  Postoperative atrial fibrillation ([place]) In 2013 and Feb 2013- discharged on Amiodarone, NSR today in f/u  Essential hypertension Controlled, normal LVF at cath Jan 2017  Hepatitis C s/p interferon/[facility] Rx; chronically elevated LFTs  Hyperlipidemia On low dose statin Rx   PLAN  Slowly improving post re do CABG. I suggested they decrease his Amiodarone to 200 mg daily in 7 days. He probably shouldn't be on this long term if possible with his chronic liver disease (slightly elevated LFTs despite interferon Rx), will review with Dr Percival Spanish. I did give an Rx for Xanax 0.5 mg QHS prn, #20 with no refills. F/U with Dr Percival Spanish 2 months.   Erlene Quan PA-C 08/21/2015 2:47 PM

## 2015-08-21 NOTE — Assessment & Plan Note (Signed)
s/p interferon/[facility] Rx; chronically elevated LFTs

## 2015-08-21 NOTE — Assessment & Plan Note (Signed)
Controlled, normal LVF at cath Jan 2017

## 2015-08-21 NOTE — Telephone Encounter (Signed)
Manuel Bell has requested a refill for  Oxycodone s/p CABG.  I called and left him a message that a new script would be available at out office today.

## 2015-08-21 NOTE — Patient Instructions (Signed)
Your physician has recommended you make the following change in your medication: Starting on MARCH 1st Cut the amiodarone to 200mg  once a day. ( 1/2 of the 400mg  tablet)  Your physician recommends that you schedule a follow-up appointment in: 6-8 weeks with Dr Percival Spanish.

## 2015-08-21 NOTE — Assessment & Plan Note (Signed)
09/12/2011- L-LAD, S-Dx1, S-OM1/CFX, S-PDA 08/01/2015- CABG x 2, LRA-RI, S-PL EF normal

## 2015-08-29 ENCOUNTER — Encounter (HOSPITAL_COMMUNITY): Payer: Self-pay | Admitting: Cardiac Rehabilitation

## 2015-08-29 DIAGNOSIS — Z951 Presence of aortocoronary bypass graft: Secondary | ICD-10-CM

## 2015-08-30 ENCOUNTER — Telehealth: Payer: Self-pay | Admitting: Cardiology

## 2015-08-30 NOTE — Telephone Encounter (Signed)
New Message  Pt requested to speak w/ RN concerning upcoming cardiac rehab. Please call back and discuss.

## 2015-08-30 NOTE — Telephone Encounter (Signed)
LEFT MESSAGE TO CALL BACK

## 2015-09-01 ENCOUNTER — Telehealth: Payer: Self-pay | Admitting: Cardiology

## 2015-09-01 NOTE — Telephone Encounter (Signed)
Pt wondering if paper for clearance to cardiac rehab has been sent back. Pt advised that they send [place] paper and we send back. Pt stated he started that starts on the 2/20 and wants to make sure all is setup. Told him I would forward to MD to make sure he is aware paperwork is coming. Pt verbalized understanding, no additional questions at this time.

## 2015-09-01 NOTE — Telephone Encounter (Signed)
Received signed [facility] Attending Physician Statement back from Dr Percival Spanish.  Notified patient and faxed to [facility] 09/01/15. lp

## 2015-09-05 ENCOUNTER — Other Ambulatory Visit: Payer: Self-pay | Admitting: Cardiothoracic Surgery

## 2015-09-05 DIAGNOSIS — Z951 Presence of aortocoronary bypass graft: Secondary | ICD-10-CM

## 2015-09-06 ENCOUNTER — Encounter: Payer: Self-pay | Admitting: Cardiothoracic Surgery

## 2015-09-07 ENCOUNTER — Ambulatory Visit (HOSPITAL_COMMUNITY): Payer: BLUE CROSS/BLUE SHIELD

## 2015-09-11 ENCOUNTER — Encounter (HOSPITAL_COMMUNITY): Admission: RE | Admit: 2015-09-11 | Payer: BLUE CROSS/BLUE SHIELD | Source: Ambulatory Visit

## 2015-09-11 ENCOUNTER — Other Ambulatory Visit: Payer: Self-pay | Admitting: Cardiology

## 2015-09-12 NOTE — Telephone Encounter (Signed)
Approve for one month.  He will need to get this from a primary care MD in the future.

## 2015-09-13 ENCOUNTER — Encounter (HOSPITAL_COMMUNITY): Payer: BLUE CROSS/BLUE SHIELD

## 2015-09-13 ENCOUNTER — Encounter: Payer: Self-pay | Admitting: Cardiothoracic Surgery

## 2015-09-13 NOTE — Telephone Encounter (Signed)
Spoke with pharmacist at [facility], Zolpidem was approve to refill by Dr Percival Spanish #30 0 refills pt will need to have a PCP in ordered to have more refills.

## 2015-09-14 ENCOUNTER — Ambulatory Visit (INDEPENDENT_AMBULATORY_CARE_PROVIDER_SITE_OTHER): Payer: Self-pay | Admitting: Cardiothoracic Surgery

## 2015-09-14 ENCOUNTER — Encounter: Payer: Self-pay | Admitting: Cardiothoracic Surgery

## 2015-09-14 ENCOUNTER — Ambulatory Visit
Admission: RE | Admit: 2015-09-14 | Discharge: 2015-09-14 | Disposition: A | Discharge status: Home / Self Care | Payer: BLUE CROSS/BLUE SHIELD | Source: Ambulatory Visit | Attending: Cardiothoracic Surgery | Admitting: Cardiothoracic Surgery

## 2015-09-14 VITALS — BP 148/86 | HR 62 | Resp 16 | Ht 69.0 in | Wt 184.6 lb

## 2015-09-14 DIAGNOSIS — Z951 Presence of aortocoronary bypass graft: Secondary | ICD-10-CM

## 2015-09-14 DIAGNOSIS — I2510 Atherosclerotic heart disease of native coronary artery without angina pectoris: Secondary | ICD-10-CM

## 2015-09-14 NOTE — Progress Notes (Signed)
PCP is No PCP Per Patient Referring Provider is Minus Breeding, MD  Chief Complaint  Patient presents with  . Routine Post Op    s/p REDO CABG  08/01/15 with a CXR    HPI: Patient returns for schedule followup after redo CABG x2  6 weeks ago after presented with unstable angina. He had a radial artery graft  To the ramus intermediate and a vein graft to the distal RCA posterior lateral branch. He is done well after surgery without recurrent angina. Surgical incisions are well-healed. Chest x-ray is clear. He is maintained sinus rhythm and we will stop his amiodarone now after 6 weeks of therapy and with history of previous hepatitis C and elevated LFTs.  The patient is ready to return to work at a [employer], [employer] job and return to work no provided. He is ready to start cardiac rehabilitation which is scheduled next week. He understands he could resume driving and lift up to 20 pounds but no more than that until 3 months after surgery. The patient's targets that time redo surgery were suboptimal and he would not be a candidate for further surgical revascularization.  Past Medical History  Diagnosis Date  . Hyperlipidemia   . CAD (coronary artery disease)     LHC 09/11/11: 3 vessel CAD, EF 45-50%  . Hx of CABG 08/2011    Dr. Prescott Gum - LIMA to LAD, SVG-D1, SVG-distal RCA, SVG-RI/distal circumflex  . Postoperative atrial fibrillation ([place]) 08/2011    Amiodarone  . Alcohol abuse   . Complication of anesthesia     slow to awaken  . PONV (postoperative nausea and vomiting)   . Family history of anesthesia complication   . Dysrhythmia     palpations at times  . Asthma     as a child   . GERD (gastroesophageal reflux disease)   . Coronary atherosclerosis of native coronary artery 10/02/2011  . HYPERLIPIDEMIA 11/22/2009    Qualifier: Diagnosis of  By: Linna Darner MD, Gwyndolyn Saxon   Pravastatin 40 mg started post CBAG 08/2011      . HYPERTENSION, ESSENTIAL NOS 08/20/2007    Qualifier: Diagnosis of  By:  Linna Darner MD, Monia Pouch, VIRAL HEPATITIS C 01/28/2007    Qualifier: Diagnosis of  By: Linna Darner MD, Carmell Austria, TRANSAMINASE/LDH LEVELS 02/06/2007    Qualifier: Diagnosis of  By: Janelle Floor    . LUMBAR RADICULOPATHY, RIGHT 01/02/2010    Qualifier: Diagnosis of  By: Linna Darner MD, Gwyndolyn Saxon    . Other abnormal glucose 01/07/2012    Fasting blood sugar ranged 103-116 hospitalized March/April 2013.   . S/P CABG x 5 09/24/2011    09/12/2011   . NEURALGIA 11/22/2009    Qualifier: Diagnosis of  By: Linna Darner MD, Gwyndolyn Saxon    . GERD 10/19/2008    Qualifier: Diagnosis of  By: Linna Darner MD, Gwyndolyn Saxon    . Shortness of breath dyspnea   . Arthritis   . Hepatitis C     s/p interferon/[facility]; chronically elevated LFTs. treated 2015- 2016- Completed 07/2014    Past Surgical History  Procedure Laterality Date  . Orchiectomy      left for scar tissue post torsion; Dr Hartley Barefoot  . Tympanic membranes Left     perforation X 3 of  L  TM  ;surgically repaired  . Arthroscopic knee surgery Left     left  . Colonoscopy  2005    negative; [facility]  . Esophagogastroduodenoscopy (egd) with  propofol N/A 02/16/2014    Procedure: ESOPHAGOGASTRODUODENOSCOPY (EGD) WITH PROPOFOL;  Surgeon: Winfield Cunas., MD;  Location: [facility] ENDOSCOPY;  Service: Endoscopy;  Laterality: N/A;  H&P in file  . Cardiac catheterization N/A 07/14/2015    Procedure: Left Heart Cath and Coronary Angiography;  Surgeon: Burnell Blanks, MD;  Location: [place] [facility];  Service: Cardiovascular;  Laterality: N/A;  . Coronary artery bypass graft  09/12/2011    Procedure: CORONARY ARTERY BYPASS GRAFTING (CABG);  Surgeon: Ivin Poot, MD;  Location: [place];  Service: Open Heart Surgery;  Laterality: N/A;  . Coronary artery bypass graft N/A 08/01/2015    Procedure: REDO CORONARY ARTERY BYPASS GRAFTING x two (CABG) x   using left radial artery, and left leg greater saphenous vein harvested endoscopically, also performed coronary  endarderectomy. Placement of Intra aortic balloon pump (IAB).;  Surgeon: Ivin Poot, MD;  Location: [place];  Service: Open Heart Surgery;  Laterality: N/A;  . Radial artery harvest Left 08/01/2015    Procedure: RADIAL ARTERY HARVEST;  Surgeon: Ivin Poot, MD;  Location: [place];  Service: Open Heart Surgery;  Laterality: Left;  . Tee without cardioversion N/A 08/01/2015    Procedure: TRANSESOPHAGEAL ECHOCARDIOGRAM (TEE);  Surgeon: Ivin Poot, MD;  Location: [place];  Service: Open Heart Surgery;  Laterality: N/A;    Family History  Problem Relation Age of Onset  . Lung cancer Mother 41  . Heart attack Maternal Grandfather 54  . Hyperlipidemia Sister   . Diabetes Neg Hx   . Stroke Neg Hx     Social History Social History  Substance Use Topics  . Smoking status: Former Smoker -- 1.00 packs/day for 30 years    Types: Cigarettes    Quit date: 09/10/2003  . Smokeless tobacco: None     Comment: smoked 1971- 2005 (off intermittently 1.5 years), up to 1 ppd   . Alcohol Use: 6.0 oz/week    10 Glasses of wine per week    Current Outpatient Prescriptions  Medication Sig Dispense Refill  . acyclovir ointment (ZOVIRAX) 5 % Apply 1 application topically as needed (for fever blisters).    . ALPRAZolam (XANAX) 0.5 MG tablet Take 0.5 tablets (0.25 mg total) by mouth at bedtime as needed. 20 tablet 0  . amiodarone (PACERONE) 400 MG tablet Take 0.5 tablets (200 mg total) by mouth daily. 30 tablet 1  . aspirin EC 325 MG EC tablet Take 1 tablet (325 mg total) by mouth daily.    . digoxin (LANOXIN) 0.25 MG tablet Take 1 tablet (0.25 mg total) by mouth daily. 30 tablet 1  . isosorbide mononitrate (IMDUR) 30 MG 24 hr tablet Take 0.5 tablets (15 mg total) by mouth daily. 16 tablet 1  . lansoprazole (PREVACID) 30 MG capsule Take 1 capsule (30 mg total) by mouth daily. 90 capsule 1  . lisinopril (PRINIVIL,ZESTRIL) 5 MG tablet Take 1 tablet (5 mg total) by mouth daily. 30 tablet 1  . metoprolol  tartrate (LOPRESSOR) 25 MG tablet Take 1 tablet (25 mg total) by mouth 2 (two) times daily. 60 tablet 1  . Omega-3 Fatty Acids (FISH OIL) 1200 MG CAPS Take 1,200 mg by mouth 2 (two) times daily.     Marland Kitchen oxyCODONE (OXY IR/ROXICODONE) 5 MG immediate release tablet Take 1-2 tablets (5-10 mg total) by mouth every 6 (six) hours as needed for severe pain. 50 tablet 0  . pravastatin (PRAVACHOL) 40 MG tablet Take 1 tablet (40 mg total) by  mouth every evening. 30 tablet 6  . zolpidem (AMBIEN) 10 MG tablet TAKE 1 TABLET EVERY 3RD NIGHT *ONLY AS NEEDED* 30 tablet 0  . [DISCONTINUED] amLODipine (NORVASC) 10 MG tablet Take 1 tablet (10 mg total) by mouth daily. 90 tablet 0   No current facility-administered medications for this visit.    Allergies  Allergen Reactions  . Sulfa Antibiotics Anxiety    Feels like his head was going to pop off  . Tape Other (See Comments)    Bruising, Please use "paper" tape only.    Review of Systems   Complains of insomnia Some numbness and left hand fourth and fifth fingers probably from sternotomy stretch of the neurovascular bundle No edema shortness of breath or chest pain. Not taking narcotics  BP 148/86 mmHg  Pulse 62  Resp 16  Ht 5\' 9"  (1.753 m)  Wt 184 lb 9.6 oz (83.734 kg)  BMI 27.25 kg/m2  SpO2 97% Physical Exam Alert and comfortable Breath sounds clear and equal Heart rhythm regular sinus rhythm without gallop Sternal incision well-healed Left arm radial artery harvest incision well-healed Neuro intact  Diagnostic Tests: Chest x-ray clear  Impression: Excellent early course after this will redo CABG x2. Importance of healthy diet and lifestyle stressed to the patient. We will stop his amiodarone now and finishes Lanoxin stop when current prescription is out. He can drive, do normal daily activities, and lift up to 20 pounds until mid May.  Plan: He'll return as needed. He has appointment to see Dr. Percival Spanish next month.  Len Childs,  MD [facility] [phone]

## 2015-09-15 ENCOUNTER — Encounter (HOSPITAL_COMMUNITY): Payer: BLUE CROSS/BLUE SHIELD

## 2015-09-18 ENCOUNTER — Encounter (HOSPITAL_COMMUNITY): Payer: BLUE CROSS/BLUE SHIELD

## 2015-09-19 ENCOUNTER — Encounter: Payer: Self-pay | Admitting: Cardiothoracic Surgery

## 2015-09-20 ENCOUNTER — Encounter (HOSPITAL_COMMUNITY): Payer: BLUE CROSS/BLUE SHIELD

## 2015-09-21 ENCOUNTER — Telehealth (HOSPITAL_COMMUNITY): Payer: Self-pay

## 2015-09-21 ENCOUNTER — Encounter (HOSPITAL_COMMUNITY): Payer: Self-pay

## 2015-09-21 ENCOUNTER — Encounter (HOSPITAL_COMMUNITY)
Admission: RE | Admit: 2015-09-21 | Discharge: 2015-09-21 | Disposition: A | Discharge status: Home / Self Care | Payer: BLUE CROSS/BLUE SHIELD | Source: Ambulatory Visit | Attending: Cardiology | Admitting: Cardiology

## 2015-09-21 VITALS — BP 126/62 | HR 77 | Ht 68.25 in | Wt 183.4 lb

## 2015-09-21 DIAGNOSIS — Z951 Presence of aortocoronary bypass graft: Secondary | ICD-10-CM | POA: Diagnosis not present

## 2015-09-21 NOTE — Progress Notes (Signed)
Cardiac Individual Treatment Plan  Patient Details  Name: Manuel Bell MRN: [id number] Date of Birth: 07-19-1955 Referring Provider:  Minus Breeding, MD  Initial Encounter Date:       CARDIAC REHAB PHASE II ORIENTATION from 09/21/2015 in [place]   Date  09/21/15      Visit Diagnosis: S/P CABG x 2  Patient's Home Medications on Admission:  Current outpatient prescriptions:  .  acyclovir ointment (ZOVIRAX) 5 %, Apply 1 application topically as needed (for fever blisters)., Disp: , Rfl:  .  ALPRAZolam (XANAX) 0.5 MG tablet, Take 0.5 tablets (0.25 mg total) by mouth at bedtime as needed., Disp: 20 tablet, Rfl: 0 .  aspirin EC 325 MG EC tablet, Take 1 tablet (325 mg total) by mouth daily., Disp: , Rfl:  .  lansoprazole (PREVACID) 30 MG capsule, Take 1 capsule (30 mg total) by mouth daily., Disp: 90 capsule, Rfl: 1 .  lisinopril (PRINIVIL,ZESTRIL) 5 MG tablet, Take 1 tablet (5 mg total) by mouth daily., Disp: 30 tablet, Rfl: 1 .  metoprolol tartrate (LOPRESSOR) 25 MG tablet, Take 1 tablet (25 mg total) by mouth 2 (two) times daily., Disp: 60 tablet, Rfl: 1 .  Omega-3 Fatty Acids (FISH OIL) 1200 MG CAPS, Take 1,200 mg by mouth 2 (two) times daily. , Disp: , Rfl:  .  pravastatin (PRAVACHOL) 40 MG tablet, Take 1 tablet (40 mg total) by mouth every evening., Disp: 30 tablet, Rfl: 6 .  zolpidem (AMBIEN) 10 MG tablet, TAKE 1 TABLET EVERY 3RD NIGHT *ONLY AS NEEDED*, Disp: 30 tablet, Rfl: 0 .  [DISCONTINUED] amLODipine (NORVASC) 10 MG tablet, Take 1 tablet (10 mg total) by mouth daily., Disp: 90 tablet, Rfl: 0  Past Medical History: Past Medical History  Diagnosis Date  . Hyperlipidemia   . CAD (coronary artery disease)     LHC 09/11/11: 3 vessel CAD, EF 45-50%  . Hx of CABG 08/2011    Dr. Prescott Gum - LIMA to LAD, SVG-D1, SVG-distal RCA, SVG-RI/distal circumflex  . Postoperative atrial fibrillation ([place]) 08/2011    Amiodarone  . Alcohol abuse   .  Complication of anesthesia     slow to awaken  . PONV (postoperative nausea and vomiting)   . Family history of anesthesia complication   . Dysrhythmia     palpations at times  . Asthma     as a child   . GERD (gastroesophageal reflux disease)   . Coronary atherosclerosis of native coronary artery 10/02/2011  . HYPERLIPIDEMIA 11/22/2009    Qualifier: Diagnosis of  By: Linna Darner MD, Gwyndolyn Saxon   Pravastatin 40 mg started post CBAG 08/2011      . HYPERTENSION, ESSENTIAL NOS 08/20/2007    Qualifier: Diagnosis of  By: Linna Darner MD, Monia Pouch, VIRAL HEPATITIS C 01/28/2007    Qualifier: Diagnosis of  By: Linna Darner MD, Carmell Austria, TRANSAMINASE/LDH LEVELS 02/06/2007    Qualifier: Diagnosis of  By: Janelle Floor    . LUMBAR RADICULOPATHY, RIGHT 01/02/2010    Qualifier: Diagnosis of  By: Linna Darner MD, Gwyndolyn Saxon    . Other abnormal glucose 01/07/2012    Fasting blood sugar ranged 103-116 hospitalized March/April 2013.   . S/P CABG x 5 09/24/2011    09/12/2011   . NEURALGIA 11/22/2009    Qualifier: Diagnosis of  By: Linna Darner MD, Gwyndolyn Saxon    . GERD 10/19/2008    Qualifier: Diagnosis of  By: Linna Darner MD, Gwyndolyn Saxon    .  Shortness of breath dyspnea   . Arthritis   . Hepatitis C     s/p interferon/[facility]; chronically elevated LFTs. treated 2015- 2016- Completed 07/2014    Tobacco Use: History  Smoking status  . Former Smoker -- 1.00 packs/day for 30 years  . Types: Cigarettes  . Quit date: 09/10/2003  Smokeless tobacco  . Not on file    Comment: smoked 1971- 2005 (off intermittently 1.5 years), up to 1 ppd     Labs:     Recent Review Flowsheet Data    Labs for ITP Cardiac and Pulmonary Rehab Latest Ref Rng 08/03/2015 08/03/2015 08/03/2015 08/04/2015 08/04/2015   PHART 7.350 - 7.450 7.430 - - - -   PCO2ART 35.0 - 45.0 mmHg 41.3 - - - -   HCO3 20.0 - 24.0 mEq/L 27.4(H) - - - -   TCO2 0 - 100 mmol/L 29 - 27 - -   O2SAT - 94.0 52.6 - TEST WILL BE CREDITED 58.6      Capillary Blood Glucose: Lab  Results  Component Value Date   GLUCAP 131* 08/08/2015   GLUCAP 97 08/07/2015   GLUCAP 126* 08/07/2015   GLUCAP 114* 08/07/2015   GLUCAP 125* 08/07/2015     Exercise Target Goals: Date: 09/21/15  Exercise Program Goal: Individual exercise prescription set with THRR, safety & activity barriers. Participant demonstrates ability to understand and report RPE using BORG scale, to self-measure pulse accurately, and to acknowledge the importance of the exercise prescription.  Exercise Prescription Goal: Starting with aerobic activity 30 plus minutes a day, 3 days per week for initial exercise prescription. Provide home exercise prescription and guidelines that participant acknowledges understanding prior to discharge.  Activity Barriers & Risk Stratification:     Activity Barriers & Cardiac Risk Stratification - 09/21/15 0831    Activity Barriers & Cardiac Risk Stratification   Activity Barriers Deconditioning;Incisional Pain;Muscular Weakness;Other (comment)   Comments Pain in left heel for past few days with walking   Cardiac Risk Stratification High      6 Minute Walk:     6 Minute Walk      09/21/15 0820       6 Minute Walk   Phase Initial     Distance 1698 feet     Walk Time 6 minutes     # of Rest Breaks 0     MPH 3.22     METS 4.09     RPE 11     VO2 Peak 14.33     Symptoms Yes (comment)     Comments c/p right heel pain     Resting HR 77 bpm     Resting BP 126/62 mmHg     Max Ex. HR 92 bpm     Max Ex. BP 142/84 mmHg     2 Minute Post BP 134/70 mmHg        Initial Exercise Prescription:     Initial Exercise Prescription - 09/21/15 0900    Date of Initial Exercise Prescription   Date 09/21/15   Bike   Level 1.4   Minutes 10   METs 4   NuStep   Level 3   Minutes 10   METs 2   Track   Laps 10   Minutes 10   METs 2.76   Prescription Details   Frequency (times per week) 3   Duration Progress to 30 minutes of continuous aerobic without  signs/symptoms of physical distress   Intensity   THRR 40-80%  of Max Heartrate [phone]   Ratings of Perceived Exertion 11-13   Progression   Progression Continue to progress workloads to maintain intensity without signs/symptoms of physical distress.   Resistance Training   Training Prescription Yes   Weight 2 lbs   Reps 10-12      Perform Capillary Blood Glucose checks as needed.  Exercise Prescription Changes:   Exercise Comments:   Discharge Exercise Prescription (Final Exercise Prescription Changes):   Nutrition:  Target Goals: Understanding of nutrition guidelines, daily intake of sodium 1500mg , cholesterol 200mg , calories 30% from fat and 7% or less from saturated fats, daily to have 5 or more servings of fruits and vegetables.  Biometrics:     Pre Biometrics - 09/21/15 1021    Pre Biometrics   Height 5' 8.25" (1.734 m)   Weight 183 lb 6.8 oz (83.2 kg)   Waist Circumference 37 inches   Hip Circumference 37 inches   Waist to Hip Ratio 1 %   BMI (Calculated) 27.7   Triceps Skinfold 12 mm   % Body Fat 25 %   Grip Strength 42 kg   Flexibility 8 in   Single Leg Stand 3.51 seconds       Nutrition Therapy Plan and Nutrition Goals:     Nutrition Therapy & Goals - 09/21/15 1432    Nutrition Therapy   Diet Therapeutic Lifestyle Changes   Personal Nutrition Goals   Personal Goal #1 Maintain current wt while in [place], educate and counsel regarding individualized specific dietary modifications aiming towards targeted core components such as weight, hypertension, lipid management, diabetes, heart failure and other comorbidities.   Expected Outcomes Short Term Goal: Understand basic principles of dietary content, such as calories, fat, sodium, cholesterol and nutrients.;Long Term Goal: Adherence to prescribed nutrition plan.      Nutrition Discharge: Nutrition Scores:     Nutrition Assessments - 09/21/15 1432     MEDFICTS Scores   Pre Score 36  will confirm score when nutr discussed      Nutrition Goals Re-Evaluation:   Psychosocial: Target Goals: Acknowledge presence or absence of depression, maximize coping skills, provide positive support system. Participant is able to verbalize types and ability to use techniques and skills needed for reducing stress and depression.  Initial Review & Psychosocial Screening:     Initial Psych Review & Screening - 09/21/15 [place]? Yes   Barriers   Psychosocial barriers to participate in program There are no identifiable barriers or psychosocial needs.   Screening Interventions   Interventions Encouraged to exercise      Quality of Life Scores:     Quality of Life - 09/21/15 1028    Quality of Life Scores   Health/Function Pre 21.2 %   Socioeconomic Pre 21.94 %   Psych/Spiritual Pre 22.5 %   Family Pre 20.13 %   GLOBAL Pre 21.45 %      PHQ-9:     Recent Review Flowsheet Data    Depression screen [facility] 2/9 08/29/2015 10/14/2011   Decreased Interest 0 0   Down, Depressed, Hopeless 0 0   PHQ - 2 Score 0 0      Psychosocial Evaluation and Intervention:   Psychosocial Re-Evaluation:   Vocational Rehabilitation: Provide vocational rehab assistance to qualifying candidates.   Vocational Rehab Evaluation & Intervention:     Vocational Rehab - 09/21/15 1545    Initial Vocational Rehab  Evaluation & Intervention   Assessment shows need for Vocational Rehabilitation No      Education: Education Goals: Education classes will be provided on a weekly basis, covering required topics. Participant will state understanding/return demonstration of topics presented.  Learning Barriers/Preferences:     Learning Barriers/Preferences - 08/29/15 1228    Learning Barriers/Preferences   Learning Barriers Hearing   Learning Preferences Written Material      Education Topics: Count Your Pulse:  -Group  instruction provided by verbal instruction, demonstration, patient participation and written materials to support subject.  Instructors address importance of being able to find your pulse and how to count your pulse when at home without a heart monitor.  Patients get hands on experience counting their pulse with staff help and individually.   Heart Attack, Angina, and Risk Factor Modification:  -Group instruction provided by verbal instruction, video, and written materials to support subject.  Instructors address signs and symptoms of angina and heart attacks.    Also discuss risk factors for heart disease and how to make changes to improve heart health risk factors.   Functional Fitness:  -Group instruction provided by verbal instruction, demonstration, patient participation, and written materials to support subject.  Instructors address safety measures for doing things around the house.  Discuss how to get up and down off the floor, how to pick things up properly, how to safely get out of a chair without assistance, and balance training.   Meditation and Mindfulness:  -Group instruction provided by verbal instruction, patient participation, and written materials to support subject.  Instructor addresses importance of mindfulness and meditation practice to help reduce stress and improve awareness.  Instructor also leads participants through a meditation exercise.    Stretching for Flexibility and Mobility:  -Group instruction provided by verbal instruction, patient participation, and written materials to support subject.  Instructors lead participants through series of stretches that are designed to increase flexibility thus improving mobility.  These stretches are additional exercise for major muscle groups that are typically performed during regular warm up and cool down.   Hands Only CPR Anytime:  -Group instruction provided by verbal instruction, video, patient participation and written  materials to support subject.  Instructors co-teach with AHA video for hands only CPR.  Participants get hands on experience with mannequins.   Nutrition I class: Heart Healthy Eating:  -Group instruction provided by PowerPoint slides, verbal discussion, and written materials to support subject matter. The instructor gives an explanation and review of the Therapeutic Lifestyle Changes diet recommendations, which includes a discussion on lipid goals, dietary fat, sodium, fiber, plant stanol/sterol esters, sugar, and the components of a well-balanced, healthy diet.   Nutrition II class: Lifestyle Skills:  -Group instruction provided by PowerPoint slides, verbal discussion, and written materials to support subject matter. The instructor gives an explanation and review of label reading, grocery shopping for heart health, heart healthy recipe modifications, and ways to make healthier choices when eating out.   Diabetes Question & Answer:  -Group instruction provided by PowerPoint slides, verbal discussion, and written materials to support subject matter. The instructor gives an explanation and review of diabetes co-morbidities, pre- and post-prandial blood glucose goals, pre-exercise blood glucose goals, signs, symptoms, and treatment of hypoglycemia and hyperglycemia, and foot care basics.   Diabetes Blitz:  -Group instruction provided by PowerPoint slides, verbal discussion, and written materials to support subject matter. The instructor gives an explanation and review of the physiology behind type 1 and type 2 diabetes, diabetes  medications and rational behind using different medications, pre- and post-prandial blood glucose recommendations and Hemoglobin A1c goals, diabetes diet, and exercise including blood glucose guidelines for exercising safely.    Portion Distortion:  -Group instruction provided by PowerPoint slides, verbal discussion, written materials, and food models to support subject  matter. The instructor gives an explanation of serving size versus portion size, changes in portions sizes over the last 20 years, and what consists of a serving from each food group.   Stress Management:  -Group instruction provided by verbal instruction, video, and written materials to support subject matter.  Instructors review role of stress in heart disease and how to cope with stress positively.     Exercising on Your Own:  -Group instruction provided by verbal instruction, power point, and written materials to support subject.  Instructors discuss benefits of exercise, components of exercise, frequency and intensity of exercise, and end points for exercise.  Also discuss use of nitroglycerin and activating EMS.  Review options of places to exercise outside of rehab.  Review guidelines for sex with heart disease.   Cardiac Drugs I:  -Group instruction provided by verbal instruction and written materials to support subject.  Instructor reviews cardiac drug classes: antiplatelets, anticoagulants, beta blockers, and statins.  Instructor discusses reasons, side effects, and lifestyle considerations for each drug class.   Cardiac Drugs II:  -Group instruction provided by verbal instruction and written materials to support subject.  Instructor reviews cardiac drug classes: angiotensin converting enzyme inhibitors (ACE-I), angiotensin II receptor blockers (ARBs), nitrates, and calcium channel blockers.  Instructor discusses reasons, side effects, and lifestyle considerations for each drug class.   Anatomy and Physiology of the Circulatory System:  -Group instruction provided by verbal instruction, video, and written materials to support subject.  Reviews functional anatomy of heart, how it relates to various diagnoses, and what role the heart plays in the overall system.   Knowledge Questionnaire Score:     Knowledge Questionnaire Score - 09/21/15 1021    Knowledge Questionnaire Score    Pre Score 21/24      Core Components/Risk Factors/Patient Goals at Admission:     Personal Goals and Risk Factors at Admission - 09/21/15 0832    Core Components/Risk Factors/Patient Goals on Admission    Weight Management Yes   Intervention Weight Management: Develop a combined nutrition and exercise program designed to reach desired caloric intake, while maintaining appropriate intake of nutrient and fiber, sodium and fats, and appropriate energy expenditure required for the weight goal.;Weight Management/Obesity: Establish reasonable short term and long term weight goals.   Admit Weight 183 lb 6.8 oz (83.2 kg)   Goal Weight: Short Term 180 lb (81.647 kg)   Goal Weight: Long Term 173 lb (78.472 kg)   Expected Outcomes Short Term: Continue to assess and modify interventions until short term weight is achieved;Long Term: Adherence to nutrition and physical activity/exercise program aimed toward attainment of established weight goal;Weight Loss: Understanding of general recommendations for a balanced deficit meal plan, which promotes 1-2 lb weight loss per week and includes a negative energy balance of [phone] kcal/d;Understanding recommendations for meals to include 15-35% energy as protein, 25-35% energy from fat, 35-60% energy from carbohydrates, less than 200mg  of dietary cholesterol, 20-35 gm of total fiber daily;Understanding of distribution of calorie intake throughout the day with the consumption of 4-5 meals/snacks   Increase Strength and Stamina Yes  More energy, Better walking   Intervention Provide advice, education, support and counseling about physical activity/exercise needs.;Develop  an individualized exercise prescription for aerobic and resistive training based on initial evaluation findings, risk stratification, comorbidities and participant's personal goals.   Expected Outcomes Achievement of increased cardiorespiratory fitness and enhanced flexibility, muscular endurance and  strength shown through measurements of functional capacity and personal statement of participant.   Hypertension Yes   Intervention Provide education on lifestyle modifcations including regular physical activity/exercise, weight management, moderate sodium restriction and increased consumption of fresh fruit, vegetables, and low fat dairy, alcohol moderation, and smoking cessation.;Monitor prescription use compliance.   Expected Outcomes Short Term: Continued assessment and intervention until BP is < 140/59mm HG in hypertensive participants. < 130/34mm HG in hypertensive participants with diabetes, heart failure or chronic kidney disease.;Long Term: Maintenance of blood pressure at goal levels.   Lipids Yes   Intervention Provide education and support for participant on nutrition & aerobic/resistive exercise along with prescribed medications to achieve LDL 70mg , HDL >40mg .   Expected Outcomes Short Term: Participant states understanding of desired cholesterol values and is compliant with medications prescribed. Participant is following exercise prescription and nutrition guidelines.;Long Term: Cholesterol controlled with medications as prescribed, with individualized exercise RX and with personalized nutrition plan. Value goals: LDL < 70mg , HDL > 40 mg.   Stress Yes   Intervention Offer individual and/or small group education and counseling on adjustment to heart disease, stress management and health-related lifestyle change. Teach and support self-help strategies.   Expected Outcomes Short Term: Participant demonstrates changes in health-related behavior, relaxation and other stress management skills, ability to obtain effective social support, and compliance with psychotropic medications if prescribed.;Long Term: Emotional wellbeing is indicated by absence of clinically significant psychosocial distress or social isolation.   Personal Goal Other Yes   Personal Goal Better walking, less pain    Intervention Provide exercise prescription to increase functional capacity and stregth   Expected Outcomes Increase functional capacity      Core Components/Risk Factors/Patient Goals Review:    Core Components/Risk Factors/Patient Goals at Discharge (Final Review):    ITP Comments:     ITP Comments      09/21/15 0945           ITP Comments Medical Director-Dr. Fransico Him, MD          Comments: Patient attended orientation from 0800 to 1000 to review rules and guidelines for program. Completed 6 minute walk test, Intitial ITP, and exercise prescription.  VSS. Telemetry-Sinus Rhythm.  Asymptomatic.

## 2015-09-21 NOTE — Telephone Encounter (Signed)
Spoke with [employer] to verify insurance. Pt deductible of $600 has been met as well as out pocket of $4300 . Pt will be covered at 100% with a max visit of 36 sessions or 12 weeks.

## 2015-09-21 NOTE — Progress Notes (Signed)
Cardiac Rehab Medication Review by a Pharmacist  Does the patient  feel that his/her medications are working for him/her?  yes  Has the patient been experiencing any side effects to the medications prescribed?  no  Does the patient measure his/her own blood pressure or blood glucose at home?  yes   Does the patient have any problems obtaining medications due to transportation or finances?   no  Understanding of regimen: excellent Understanding of indications: good Potential of compliance: excellent  Pharmacist comments: [name] is a 60 yo M who is doing well with his medications. He reports recently making some medication adjustments and understands his new regimen well. He did ask about his aspirin dose of 325 mg and I confirmed that this is what he was prescribed.   Dimitri Ped, PharmD. PGY-1 Pharmacy Resident Pager: [phone] 09/21/2015 8:21 AM

## 2015-09-22 ENCOUNTER — Encounter (HOSPITAL_COMMUNITY): Payer: BLUE CROSS/BLUE SHIELD

## 2015-09-25 ENCOUNTER — Encounter (HOSPITAL_COMMUNITY): Payer: BLUE CROSS/BLUE SHIELD

## 2015-09-25 ENCOUNTER — Telehealth: Payer: Self-pay | Admitting: Cardiology

## 2015-09-25 ENCOUNTER — Encounter (HOSPITAL_COMMUNITY)
Admission: RE | Admit: 2015-09-25 | Discharge: 2015-09-25 | Disposition: A | Discharge status: Home / Self Care | Payer: BLUE CROSS/BLUE SHIELD | Source: Ambulatory Visit | Attending: Cardiology | Admitting: Cardiology

## 2015-09-25 DIAGNOSIS — Z951 Presence of aortocoronary bypass graft: Secondary | ICD-10-CM | POA: Diagnosis present

## 2015-09-25 NOTE — Progress Notes (Signed)
Daily Session Note  Patient Details  Name: Manuel Bell MRN: [id number] Date of Birth: [birth year] Referring Provider:  No ref. provider found  Encounter Date: 09/25/2015  Check In:     Session Check In - 09/25/15 1519    Pain Assessment   Currently in Pain? Yes   Pain Score 7    Pain Location Foot      Capillary Blood Glucose: No results found for this or any previous visit (from the past 24 hour(s)).   Goals Met:  Exercise tolerated well  Goals Unmet:  Not Applicable  Comments: Pt started cardiac rehab today.  Pt tolerated light exercise without complaints . VSS, telemetry-Sinus Rhythm initial blood pressure 138/58. Abe People reported today was he is having problems with Plantar Fasciatus blood pressure noted at 158/100 on the airdyne. Workload decreased recheck blood pressure 166/84 then 158/60 subsequent blood pressures improved. Trixie Dredge RN called and notified about elevated blood pressure at  , asymptomatic.  Medication list reconciled. Pt denies barriers to medicaiton compliance.  PSYCHOSOCIAL ASSESSMENT:  PHQ-0. Pt exhibits positive coping skills, hopeful outlook with supportive family. No psychosocial needs identified at this time, no psychosocial interventions necessary.    Pt enjoys watching TV.   Pt oriented to exercise equipment and routine.    Understanding verbalized.   Dr. Fransico Him is Medical Director for Cardiac Rehab at [facility].

## 2015-09-25 NOTE — Telephone Encounter (Signed)
SPOKE TO Manuel Frederickson RN - CARDIAC REHAB  First day of class Manuel Bell states patient blood pressure increased after using the bike during Cardiac rehab increaseed 158-160's/100 -- decrease to 128/78  Patient states he has plantar fascitis- hurting a little Patient has recovered. Patient did take his medications.  Will monitor patient - at next schedule appointment- this Wednesday

## 2015-09-27 ENCOUNTER — Encounter (HOSPITAL_COMMUNITY)
Admission: RE | Admit: 2015-09-27 | Discharge: 2015-09-27 | Disposition: A | Discharge status: Home / Self Care | Payer: BLUE CROSS/BLUE SHIELD | Source: Ambulatory Visit | Attending: Cardiology | Admitting: Cardiology

## 2015-09-27 ENCOUNTER — Encounter (HOSPITAL_COMMUNITY): Payer: BLUE CROSS/BLUE SHIELD

## 2015-09-27 DIAGNOSIS — Z951 Presence of aortocoronary bypass graft: Secondary | ICD-10-CM

## 2015-09-28 ENCOUNTER — Other Ambulatory Visit: Payer: Self-pay | Admitting: Cardiology

## 2015-09-29 ENCOUNTER — Encounter (HOSPITAL_COMMUNITY): Payer: BLUE CROSS/BLUE SHIELD

## 2015-09-29 ENCOUNTER — Encounter (HOSPITAL_COMMUNITY)
Admission: RE | Admit: 2015-09-29 | Discharge: 2015-09-29 | Disposition: A | Discharge status: Home / Self Care | Payer: BLUE CROSS/BLUE SHIELD | Source: Ambulatory Visit | Attending: Cardiology | Admitting: Cardiology

## 2015-09-29 DIAGNOSIS — Z951 Presence of aortocoronary bypass graft: Secondary | ICD-10-CM

## 2015-09-29 NOTE — Telephone Encounter (Signed)
Rx request sent to pharmacy.  

## 2015-10-02 ENCOUNTER — Telehealth (HOSPITAL_COMMUNITY): Payer: Self-pay | Admitting: General Practice

## 2015-10-02 ENCOUNTER — Encounter (HOSPITAL_COMMUNITY): Payer: BLUE CROSS/BLUE SHIELD

## 2015-10-04 ENCOUNTER — Encounter (HOSPITAL_COMMUNITY): Payer: BLUE CROSS/BLUE SHIELD

## 2015-10-04 ENCOUNTER — Telehealth: Payer: Self-pay | Admitting: Cardiology

## 2015-10-04 ENCOUNTER — Encounter (HOSPITAL_COMMUNITY)
Admission: RE | Admit: 2015-10-04 | Discharge: 2015-10-04 | Disposition: A | Discharge status: Home / Self Care | Payer: BLUE CROSS/BLUE SHIELD | Source: Ambulatory Visit | Attending: Cardiology | Admitting: Cardiology

## 2015-10-04 DIAGNOSIS — Z951 Presence of aortocoronary bypass graft: Secondary | ICD-10-CM | POA: Diagnosis not present

## 2015-10-04 NOTE — Progress Notes (Signed)
Patient was noted to have a nonsustained run of bigeminy and a triplet after getting of the airdyne at cardiac rehab. Patient asymptomatic. Blood pressure 142/70. Abe People is taking his medications as prescribed. No further ectopy noted during the rest of exercise. Dr Hochrein's office called and notified spoke with Lars Pinks RN. Today's ECG tracing faxed to Dr Hochrein's office for review. Exit blood pressure 114/80. Patient left cardiac rehab without complaints. Will continue to monitor the patient throughout  the program.

## 2015-10-04 NOTE — Telephone Encounter (Signed)
error 

## 2015-10-04 NOTE — Telephone Encounter (Signed)
Maria from Cardiac Rehab called about pt having a run of bigeminy just after coming off the exercise bike. BP 142/70. Pt sat down and rested a few moments and was fine with no symptoms. Maria to call back at the close of the rehab session with update.   Verdis Frederickson called back and said the pt finished his session with no other instances. Dr Ellyn Hack (dod) shown the strips, Verdis Frederickson had faxed over. No new orders at this time.

## 2015-10-06 ENCOUNTER — Encounter (HOSPITAL_COMMUNITY): Payer: BLUE CROSS/BLUE SHIELD

## 2015-10-06 ENCOUNTER — Encounter (HOSPITAL_COMMUNITY)
Admission: RE | Admit: 2015-10-06 | Discharge: 2015-10-06 | Disposition: A | Discharge status: Home / Self Care | Payer: BLUE CROSS/BLUE SHIELD | Source: Ambulatory Visit | Attending: Cardiology | Admitting: Cardiology

## 2015-10-06 DIAGNOSIS — Z951 Presence of aortocoronary bypass graft: Secondary | ICD-10-CM | POA: Diagnosis not present

## 2015-10-09 ENCOUNTER — Encounter (HOSPITAL_COMMUNITY): Payer: BLUE CROSS/BLUE SHIELD

## 2015-10-09 ENCOUNTER — Encounter (HOSPITAL_COMMUNITY)
Admission: RE | Admit: 2015-10-09 | Discharge: 2015-10-09 | Disposition: A | Discharge status: Home / Self Care | Payer: BLUE CROSS/BLUE SHIELD | Source: Ambulatory Visit | Attending: Cardiology | Admitting: Cardiology

## 2015-10-09 DIAGNOSIS — Z951 Presence of aortocoronary bypass graft: Secondary | ICD-10-CM | POA: Diagnosis not present

## 2015-10-10 ENCOUNTER — Other Ambulatory Visit: Payer: Self-pay | Admitting: Cardiovascular Disease

## 2015-10-11 ENCOUNTER — Encounter (HOSPITAL_COMMUNITY): Payer: BLUE CROSS/BLUE SHIELD

## 2015-10-11 ENCOUNTER — Encounter (HOSPITAL_COMMUNITY)
Admission: RE | Admit: 2015-10-11 | Discharge: 2015-10-11 | Disposition: A | Discharge status: Home / Self Care | Payer: BLUE CROSS/BLUE SHIELD | Source: Ambulatory Visit | Attending: Cardiology | Admitting: Cardiology

## 2015-10-11 DIAGNOSIS — Z951 Presence of aortocoronary bypass graft: Secondary | ICD-10-CM

## 2015-10-12 NOTE — Telephone Encounter (Signed)
Dr. Hochrein's pt 

## 2015-10-13 ENCOUNTER — Encounter (HOSPITAL_COMMUNITY): Payer: BLUE CROSS/BLUE SHIELD

## 2015-10-13 ENCOUNTER — Encounter (HOSPITAL_COMMUNITY)
Admission: RE | Admit: 2015-10-13 | Discharge: 2015-10-13 | Disposition: A | Discharge status: Home / Self Care | Payer: BLUE CROSS/BLUE SHIELD | Source: Ambulatory Visit | Attending: Cardiology | Admitting: Cardiology

## 2015-10-13 DIAGNOSIS — Z951 Presence of aortocoronary bypass graft: Secondary | ICD-10-CM | POA: Diagnosis not present

## 2015-10-14 NOTE — Telephone Encounter (Signed)
This should be filled by PCP.

## 2015-10-16 ENCOUNTER — Encounter (HOSPITAL_COMMUNITY)
Admission: RE | Admit: 2015-10-16 | Discharge: 2015-10-16 | Disposition: A | Discharge status: Home / Self Care | Payer: BLUE CROSS/BLUE SHIELD | Source: Ambulatory Visit | Attending: Cardiology | Admitting: Cardiology

## 2015-10-16 ENCOUNTER — Encounter (HOSPITAL_COMMUNITY): Payer: BLUE CROSS/BLUE SHIELD

## 2015-10-16 DIAGNOSIS — Z951 Presence of aortocoronary bypass graft: Secondary | ICD-10-CM | POA: Diagnosis not present

## 2015-10-16 NOTE — Progress Notes (Signed)
Manuel Bell 60 y.o. male Nutrition Note Spoke with pt. Pt known to me from previous admission. Nutrition Survey reviewed with pt. Pt is following Step 2 of the Therapeutic Lifestyle Changes diet. Pt is overweight and wants to lose wt. Pt has gained 0.8 kg since admission. Pt states he lost wt with hospitalization/surgery "and I was hoping to keep the weight off." Weight loss discussed. Pt eats out 4 meals/week. Ways to make healthier choices when eating out reviewed. Pt expressed understanding of the information reviewed. Pt aware of nutrition education classes offered. Pt reports he attended the nutrition classes during previous rehab admission. Lab Results  Component Value Date   HGBA1C 5.4 07/28/2015   Wt Readings from Last 3 Encounters:  09/21/15 183 lb 6.8 oz (83.2 kg)  09/14/15 184 lb 9.6 oz (83.734 kg)  08/21/15 182 lb 5 oz (82.696 kg)   Nutrition Diagnosis ? Food-and nutrition-related knowledge deficit related to lack of exposure to information as related to diagnosis of: ? CVD ? Overweight related to excessive energy intake as evidenced by a BMI of 27.2 ?  Nutrition Intervention ? Benefits of adopting Therapeutic Lifestyle Changes discussed when Medficts reviewed. ? Pt to attend the Portion Distortion class ? Pt given handouts for: ? Nutrition II class ? Continue client-centered nutrition education by RD, as part of interdisciplinary care.  Goal(s) ? Pt to identify food quantities necessary to achieve weight loss of 6-24 lb (2.7-10.9 kg) at graduation from cardiac rehab.  ? Pt to describe the benefit of including fruits, vegetables, whole grains, and low-fat dairy products in a heart healthy meal plan.  Monitor and Evaluate progress toward nutrition goal with team.  Derek Mound, M.Ed, RD, LDN, CDE 10/16/2015 3:53 PM

## 2015-10-17 ENCOUNTER — Other Ambulatory Visit: Payer: Self-pay

## 2015-10-17 NOTE — Telephone Encounter (Signed)
Rx has been sent to the pharmacy electronically. ° °

## 2015-10-17 NOTE — Telephone Encounter (Signed)
This is Dr. Rosezella [place] pt.  See previous refill note from Dr. Percival Spanish indicating refill should come from PCP

## 2015-10-18 ENCOUNTER — Encounter (HOSPITAL_COMMUNITY): Payer: BLUE CROSS/BLUE SHIELD

## 2015-10-18 ENCOUNTER — Encounter (HOSPITAL_COMMUNITY)
Admission: RE | Admit: 2015-10-18 | Discharge: 2015-10-18 | Disposition: A | Discharge status: Home / Self Care | Payer: BLUE CROSS/BLUE SHIELD | Source: Ambulatory Visit | Attending: Cardiology | Admitting: Cardiology

## 2015-10-18 DIAGNOSIS — Z951 Presence of aortocoronary bypass graft: Secondary | ICD-10-CM

## 2015-10-18 NOTE — Progress Notes (Signed)
Cardiac Individual Treatment Plan  Patient Details  Name: Manuel Bell MRN: [id number] Date of Birth: Dec 02, 1955 Referring Provider:        CARDIAC REHAB PHASE II EXERCISE from 10/04/2015 in [place]   Referring Provider  Minus Breeding MD      Initial Encounter Date:       CARDIAC REHAB PHASE II EXERCISE from 10/04/2015 in [place]   Date  09/21/15   Referring Provider  Minus Breeding MD      Visit Diagnosis: S/P CABG x 2  Patient's Home Medications on Admission:  Current outpatient prescriptions:  .  acyclovir ointment (ZOVIRAX) 5 %, Apply 1 application topically as needed (for fever blisters)., Disp: , Rfl:  .  ALPRAZolam (XANAX) 0.5 MG tablet, Take 0.5 tablets (0.25 mg total) by mouth at bedtime as needed., Disp: 20 tablet, Rfl: 0 .  aspirin EC 325 MG EC tablet, Take 1 tablet (325 mg total) by mouth daily., Disp: , Rfl:  .  lansoprazole (PREVACID) 30 MG capsule, Take 1 capsule (30 mg total) by mouth daily., Disp: 90 capsule, Rfl: 1 .  lisinopril (PRINIVIL,ZESTRIL) 5 MG tablet, TAKE 1 TABLET EVERY DAY, Disp: 30 tablet, Rfl: 0 .  metoprolol tartrate (LOPRESSOR) 25 MG tablet, TAKE 1 TABLET TWICE A DAY, Disp: 60 tablet, Rfl: 0 .  Omega-3 Fatty Acids (FISH OIL) 1200 MG CAPS, Take 1,200 mg by mouth 2 (two) times daily. , Disp: , Rfl:  .  pravastatin (PRAVACHOL) 40 MG tablet, Take 1 tablet (40 mg total) by mouth every evening., Disp: 30 tablet, Rfl: 6 .  zolpidem (AMBIEN) 10 MG tablet, TAKE 1 TABLET EVERY 3RD NIGHT *ONLY AS NEEDED*, Disp: 30 tablet, Rfl: 0 .  [DISCONTINUED] amLODipine (NORVASC) 10 MG tablet, Take 1 tablet (10 mg total) by mouth daily., Disp: 90 tablet, Rfl: 0  Past Medical History: Past Medical History  Diagnosis Date  . Hyperlipidemia   . CAD (coronary artery disease)     LHC 09/11/11: 3 vessel CAD, EF 45-50%  . Hx of CABG 08/2011    Dr. Prescott Gum - LIMA to LAD, SVG-D1, SVG-distal RCA,  SVG-RI/distal circumflex  . Postoperative atrial fibrillation ([place]) 08/2011    Amiodarone  . Alcohol abuse   . Complication of anesthesia     slow to awaken  . PONV (postoperative nausea and vomiting)   . Family history of anesthesia complication   . Dysrhythmia     palpations at times  . Asthma     as a child   . GERD (gastroesophageal reflux disease)   . Coronary atherosclerosis of native coronary artery 10/02/2011  . HYPERLIPIDEMIA 11/22/2009    Qualifier: Diagnosis of  By: Linna Darner MD, Gwyndolyn Saxon   Pravastatin 40 mg started post CBAG 08/2011      . HYPERTENSION, ESSENTIAL NOS 08/20/2007    Qualifier: Diagnosis of  By: Linna Darner MD, Monia Pouch, VIRAL HEPATITIS C 01/28/2007    Qualifier: Diagnosis of  By: Linna Darner MD, Carmell Austria, TRANSAMINASE/LDH LEVELS 02/06/2007    Qualifier: Diagnosis of  By: Janelle Floor    . LUMBAR RADICULOPATHY, RIGHT 01/02/2010    Qualifier: Diagnosis of  By: Linna Darner MD, Gwyndolyn Saxon    . Other abnormal glucose 01/07/2012    Fasting blood sugar ranged 103-116 hospitalized March/April 2013.   . S/P CABG x 5 09/24/2011    09/12/2011   . NEURALGIA 11/22/2009    Qualifier:  Diagnosis of  By: Linna Darner MD, Gwyndolyn Saxon    . GERD 10/19/2008    Qualifier: Diagnosis of  By: Linna Darner MD, Gwyndolyn Saxon    . Shortness of breath dyspnea   . Arthritis   . Hepatitis C     s/p interferon/[facility]; chronically elevated LFTs. treated 2015- 2016- Completed 07/2014    Tobacco Use: History  Smoking status  . Former Smoker -- 1.00 packs/day for 30 years  . Types: Cigarettes  . Quit date: 09/10/2003  Smokeless tobacco  . Not on file    Comment: smoked 1971- 2005 (off intermittently 1.5 years), up to 1 ppd     Labs: Recent Review Flowsheet Data    Labs for ITP Cardiac and Pulmonary Rehab Latest Ref Rng 08/03/2015 08/03/2015 08/03/2015 08/04/2015 08/04/2015   PHART 7.350 - 7.450 7.430 - - - -   PCO2ART 35.0 - 45.0 mmHg 41.3 - - - -   HCO3 20.0 - 24.0 mEq/L 27.4(H) - - - -   TCO2 0 - 100  mmol/L 29 - 27 - -   O2SAT - 94.0 52.6 - TEST WILL BE CREDITED 58.6      Capillary Blood Glucose: Lab Results  Component Value Date   GLUCAP 131* 08/08/2015   GLUCAP 97 08/07/2015   GLUCAP 126* 08/07/2015   GLUCAP 114* 08/07/2015   GLUCAP 125* 08/07/2015     Exercise Target Goals:    Exercise Program Goal: Individual exercise prescription set with THRR, safety & activity barriers. Participant demonstrates ability to understand and report RPE using BORG scale, to self-measure pulse accurately, and to acknowledge the importance of the exercise prescription.  Exercise Prescription Goal: Starting with aerobic activity 30 plus minutes a day, 3 days per week for initial exercise prescription. Provide home exercise prescription and guidelines that participant acknowledges understanding prior to discharge.  Activity Barriers & Risk Stratification:     Activity Barriers & Cardiac Risk Stratification - 09/21/15 0831    Activity Barriers & Cardiac Risk Stratification   Activity Barriers Deconditioning;Incisional Pain;Muscular Weakness;Other (comment)   Comments Pain in left heel for past few days with walking   Cardiac Risk Stratification High      6 Minute Walk:     6 Minute Walk      09/21/15 0820       6 Minute Walk   Phase Initial     Distance 1698 feet     Walk Time 6 minutes     # of Rest Breaks 0     MPH 3.22     METS 4.09     RPE 11     VO2 Peak 14.33     Symptoms Yes (comment)     Comments c/p right heel pain     Resting HR 77 bpm     Resting BP 126/62 mmHg     Max Ex. HR 92 bpm     Max Ex. BP 142/84 mmHg     2 Minute Post BP 134/70 mmHg        Initial Exercise Prescription:     Initial Exercise Prescription - 10/05/15 1400    Date of Initial Exercise RX and Referring Provider   Date 09/21/15   Referring Provider Minus Breeding MD   Bike   Level 1.4   Minutes 10   METs 4   NuStep   Level 3   Minutes 10   METs 2   Track   Laps 10   Minutes  10   METs  2.76   Prescription Details   Frequency (times per week) 3   Intensity   THRR 40-80% of Max Heartrate 64-127   Ratings of Perceived Exertion 11-13   Progression   Progression Continue to progress workloads to maintain intensity without signs/symptoms of physical distress.   Resistance Training   Training Prescription Yes   Weight 2 lbs   Reps 10-12      Perform Capillary Blood Glucose checks as needed.  Exercise Prescription Changes:     Exercise Prescription Changes      10/04/15 1500 10/10/15 0800         Exercise Review   Progression  Yes      Response to Exercise   Blood Pressure (Admit)  124/70 mmHg      Blood Pressure (Exercise)  138/70 mmHg      Blood Pressure (Exit)  124/84 mmHg      Heart Rate (Admit)  76 bpm      Heart Rate (Exercise)  112 bpm      Heart Rate (Exit)  72 bpm      Oxygen Saturation (Exercise)  100 %      Rating of Perceived Exertion (Exercise)  14      Comments Home Exercise Given 10/04/15 Home Exercise Given 10/04/15      Duration  Progress to 30 minutes of continuous aerobic without signs/symptoms of physical distress      Intensity  THRR unchanged      Progression   Progression  Continue to progress workloads to maintain intensity without signs/symptoms of physical distress.      Average METs  3.5      Resistance Training   Training Prescription Yes Yes      Weight 2 lbs 4lbs      Reps 10-12 10-12      Interval Training   Interval Training  No      Bike   Level 1.4 1.4      Minutes 10 10      METs 4 4.17      NuStep   Level 3 3      Minutes 10 20      METs 2 3.3      Track   Laps 10 --  No walking this time due to plantar fasciitis      Minutes 10       METs 2.76       Home Exercise Plan   Plans to continue exercise at [place] and Stationary Bike      Frequency Add 3 additional days to program exercise sessions. Add 3 additional days to program exercise sessions.          Exercise Comments:     Exercise Comments      10/10/15 0849           Exercise Comments Pt is tolerating exercise well.  He has not been able to walk for exercise recently due to a flare up on plantar fasciitis.  It is improving and he hopes to return to walking this week.          Discharge Exercise Prescription (Final Exercise Prescription Changes):     Exercise Prescription Changes - 10/10/15 0800    Exercise Review   Progression Yes   Response to Exercise   Blood Pressure (Admit) 124/70 mmHg   Blood Pressure (Exercise) 138/70 mmHg   Blood Pressure (Exit) 124/84 mmHg   Heart Rate (  Admit) 76 bpm   Heart Rate (Exercise) 112 bpm   Heart Rate (Exit) 72 bpm   Oxygen Saturation (Exercise) 100 %   Rating of Perceived Exertion (Exercise) 14   Comments Home Exercise Given 10/04/15   Duration Progress to 30 minutes of continuous aerobic without signs/symptoms of physical distress   Intensity THRR unchanged   Progression   Progression Continue to progress workloads to maintain intensity without signs/symptoms of physical distress.   Average METs 3.5   Resistance Training   Training Prescription Yes   Weight 4lbs   Reps 10-12   Interval Training   Interval Training No   Bike   Level 1.4   Minutes 10   METs 4.17   NuStep   Level 3   Minutes 20   METs 3.3   Track   Laps --  No walking this time due to plantar fasciitis   Home Exercise Plan   Plans to continue exercise at Home  Walk and Stationary Bike   Frequency Add 3 additional days to program exercise sessions.      Nutrition:  Target Goals: Understanding of nutrition guidelines, daily intake of sodium 1500mg , cholesterol 200mg , calories 30% from fat and 7% or less from saturated fats, daily to have 5 or more servings of fruits and vegetables.  Biometrics:     Pre Biometrics - 09/21/15 1021    Pre Biometrics   Height 5' 8.25" (1.734 m)   Weight 183 lb 6.8 oz (83.2 kg)   Waist Circumference 37 inches    Hip Circumference 37 inches   Waist to Hip Ratio 1 %   BMI (Calculated) 27.7   Triceps Skinfold 12 mm   % Body Fat 25 %   Grip Strength 42 kg   Flexibility 8 in   Single Leg Stand 3.51 seconds       Nutrition Therapy Plan and Nutrition Goals:     Nutrition Therapy & Goals - 09/21/15 1432    Nutrition Therapy   Diet Therapeutic Lifestyle Changes   Personal Nutrition Goals   Personal Goal #1 Maintain current wt while in [place], educate and counsel regarding individualized specific dietary modifications aiming towards targeted core components such as weight, hypertension, lipid management, diabetes, heart failure and other comorbidities.   Expected Outcomes Short Term Goal: Understand basic principles of dietary content, such as calories, fat, sodium, cholesterol and nutrients.;Long Term Goal: Adherence to prescribed nutrition plan.      Nutrition Discharge: Nutrition Scores:     Nutrition Assessments - 10/16/15 1553    MEDFICTS Scores   Pre Score 36      Nutrition Goals Re-Evaluation:   Psychosocial: Target Goals: Acknowledge presence or absence of depression, maximize coping skills, provide positive support system. Participant is able to verbalize types and ability to use techniques and skills needed for reducing stress and depression.  Initial Review & Psychosocial Screening:     Initial Psych Review & Screening - 09/21/15 [place]? Yes   Barriers   Psychosocial barriers to participate in program There are no identifiable barriers or psychosocial needs.   Screening Interventions   Interventions Encouraged to exercise      Quality of Life Scores:     Quality of Life - 09/25/15 1632    Quality of Life Scores   Health/Function Pre --  Abe People has a nephew he is concerned about who has heart  problems   Family Pre --  Abe People has support of his friends since his OHS       PHQ-9:     Recent Review Flowsheet Data    Depression screen [facility] 2/9 09/25/2015 08/29/2015 10/14/2011   Decreased Interest 0 0 0   Down, Depressed, Hopeless 0 0 0   PHQ - 2 Score 0 0 0      Psychosocial Evaluation and Intervention:   Psychosocial Re-Evaluation:   Vocational Rehabilitation: Provide vocational rehab assistance to qualifying candidates.   Vocational Rehab Evaluation & Intervention:     Vocational Rehab - 09/21/15 1545    Initial Vocational Rehab Evaluation & Intervention   Assessment shows need for Vocational Rehabilitation No      Education: Education Goals: Education classes will be provided on a weekly basis, covering required topics. Participant will state understanding/return demonstration of topics presented.  Learning Barriers/Preferences:     Learning Barriers/Preferences - 08/29/15 1228    Learning Barriers/Preferences   Learning Barriers Hearing   Learning Preferences Written Material      Education Topics: Count Your Pulse:  -Group instruction provided by verbal instruction, demonstration, patient participation and written materials to support subject.  Instructors address importance of being able to find your pulse and how to count your pulse when at home without a heart monitor.  Patients get hands on experience counting their pulse with staff help and individually.          CARDIAC REHAB PHASE II EXERCISE from 10/18/2015 in [place]   Date  09/29/15   Instruction Review Code  2- meets goals/outcomes      Heart Attack, Angina, and Risk Factor Modification:  -Group instruction provided by verbal instruction, video, and written materials to support subject.  Instructors address signs and symptoms of angina and heart attacks.    Also discuss risk factors for heart disease and how to make changes to improve heart health risk factors.   Functional Fitness:  -Group instruction provided by verbal  instruction, demonstration, patient participation, and written materials to support subject.  Instructors address safety measures for doing things around the house.  Discuss how to get up and down off the floor, how to pick things up properly, how to safely get out of a chair without assistance, and balance training.      CARDIAC REHAB PHASE II EXERCISE from 10/18/2015 in [place]   Date  10/13/15   Educator  [place]   Instruction Review Code  2- meets goals/outcomes      Meditation and Mindfulness:  -Group instruction provided by verbal instruction, patient participation, and written materials to support subject.  Instructor addresses importance of mindfulness and meditation practice to help reduce stress and improve awareness.  Instructor also leads participants through a meditation exercise.    Stretching for Flexibility and Mobility:  -Group instruction provided by verbal instruction, patient participation, and written materials to support subject.  Instructors lead participants through series of stretches that are designed to increase flexibility thus improving mobility.  These stretches are additional exercise for major muscle groups that are typically performed during regular warm up and cool down.      CARDIAC REHAB PHASE II EXERCISE from 10/18/2015 in [place]   Date  10/06/15   Educator  [place]   Instruction Review Code  2- meets goals/outcomes      Hands Only CPR Anytime:  -Group instruction provided  by verbal instruction, video, patient participation and written materials to support subject.  Instructors co-teach with [facility] video for hands only CPR.  Participants get hands on experience with mannequins.   Nutrition I class: Heart Healthy Eating:  -Group instruction provided by PowerPoint slides, verbal discussion, and written materials to support subject matter. The instructor gives an explanation and review  of the Therapeutic Lifestyle Changes diet recommendations, which includes a discussion on lipid goals, dietary fat, sodium, fiber, plant stanol/sterol esters, sugar, and the components of a well-balanced, healthy diet.   Nutrition II class: Lifestyle Skills:  -Group instruction provided by PowerPoint slides, verbal discussion, and written materials to support subject matter. The instructor gives an explanation and review of label reading, grocery shopping for heart health, heart healthy recipe modifications, and ways to make healthier choices when eating out.      CARDIAC REHAB PHASE II EXERCISE from 10/18/2015 in [place]   Date  10/16/15   Educator  RD   Instruction Review Code  Not applicable [handouts given]      Diabetes Question & Answer:  -Group instruction provided by PowerPoint slides, verbal discussion, and written materials to support subject matter. The instructor gives an explanation and review of diabetes co-morbidities, pre- and post-prandial blood glucose goals, pre-exercise blood glucose goals, signs, symptoms, and treatment of hypoglycemia and hyperglycemia, and foot care basics.   Diabetes Blitz:  -Group instruction provided by PowerPoint slides, verbal discussion, and written materials to support subject matter. The instructor gives an explanation and review of the physiology behind type 1 and type 2 diabetes, diabetes medications and rational behind using different medications, pre- and post-prandial blood glucose recommendations and Hemoglobin A1c goals, diabetes diet, and exercise including blood glucose guidelines for exercising safely.    Portion Distortion:  -Group instruction provided by PowerPoint slides, verbal discussion, written materials, and food models to support subject matter. The instructor gives an explanation of serving size versus portion size, changes in portions sizes over the last 20 years, and what consists of a serving  from each food group.      CARDIAC REHAB PHASE II EXERCISE from 10/18/2015 in [place]   Date  10/18/15   Educator  RD   Instruction Review Code  2- meets goals/outcomes      Stress Management:  -Group instruction provided by verbal instruction, video, and written materials to support subject matter.  Instructors review role of stress in heart disease and how to cope with stress positively.        CARDIAC REHAB PHASE II EXERCISE from 10/18/2015 in [place]   Date  10/11/15   Instruction Review Code  2- meets goals/outcomes      Exercising on Your Own:  -Group instruction provided by verbal instruction, power point, and written materials to support subject.  Instructors discuss benefits of exercise, components of exercise, frequency and intensity of exercise, and end points for exercise.  Also discuss use of nitroglycerin and activating EMS.  Review options of places to exercise outside of rehab.  Review guidelines for sex with heart disease.      CARDIAC REHAB PHASE II EXERCISE from 10/18/2015 in [place]   Date  09/27/15   Instruction Review Code  2- meets goals/outcomes      Cardiac Drugs I:  -Group instruction provided by verbal instruction and written materials to support subject.  Instructor reviews cardiac drug classes:  antiplatelets, anticoagulants, beta blockers, and statins.  Instructor discusses reasons, side effects, and lifestyle considerations for each drug class.      CARDIAC REHAB PHASE II EXERCISE from 10/18/2015 in [place]   Date  10/04/15   Educator  pharm D   Instruction Review Code  2- meets goals/outcomes      Cardiac Drugs II:  -Group instruction provided by verbal instruction and written materials to support subject.  Instructor reviews cardiac drug classes: angiotensin converting enzyme inhibitors (ACE-I), angiotensin II  receptor blockers (ARBs), nitrates, and calcium channel blockers.  Instructor discusses reasons, side effects, and lifestyle considerations for each drug class.   Anatomy and Physiology of the Circulatory System:  -Group instruction provided by verbal instruction, video, and written materials to support subject.  Reviews functional anatomy of heart, how it relates to various diagnoses, and what role the heart plays in the overall system.   Knowledge Questionnaire Score:     Knowledge Questionnaire Score - 09/21/15 1021    Knowledge Questionnaire Score   Pre Score 21/24      Core Components/Risk Factors/Patient Goals at Admission:     Personal Goals and Risk Factors at Admission - 09/21/15 0832    Core Components/Risk Factors/Patient Goals on Admission    Weight Management Yes   Intervention Weight Management: Develop a combined nutrition and exercise program designed to reach desired caloric intake, while maintaining appropriate intake of nutrient and fiber, sodium and fats, and appropriate energy expenditure required for the weight goal.;Weight Management/Obesity: Establish reasonable short term and long term weight goals.   Admit Weight 183 lb 6.8 oz (83.2 kg)   Goal Weight: Short Term 180 lb (81.647 kg)   Goal Weight: Long Term 173 lb (78.472 kg)   Expected Outcomes Short Term: Continue to assess and modify interventions until short term weight is achieved;Long Term: Adherence to nutrition and physical activity/exercise program aimed toward attainment of established weight goal;Weight Loss: Understanding of general recommendations for a balanced deficit meal plan, which promotes 1-2 lb weight loss per week and includes a negative energy balance of [phone] kcal/d;Understanding recommendations for meals to include 15-35% energy as protein, 25-35% energy from fat, 35-60% energy from carbohydrates, less than 200mg  of dietary cholesterol, 20-35 gm of total fiber daily;Understanding of  distribution of calorie intake throughout the day with the consumption of 4-5 meals/snacks   Increase Strength and Stamina Yes  More energy, Better walking   Intervention Provide advice, education, support and counseling about physical activity/exercise needs.;Develop an individualized exercise prescription for aerobic and resistive training based on initial evaluation findings, risk stratification, comorbidities and participant's personal goals.   Expected Outcomes Achievement of increased cardiorespiratory fitness and enhanced flexibility, muscular endurance and strength shown through measurements of functional capacity and personal statement of participant.   Hypertension Yes   Intervention Provide education on lifestyle modifcations including regular physical activity/exercise, weight management, moderate sodium restriction and increased consumption of fresh fruit, vegetables, and low fat dairy, alcohol moderation, and smoking cessation.;Monitor prescription use compliance.   Expected Outcomes Short Term: Continued assessment and intervention until BP is < 140/43mm HG in hypertensive participants. < 130/52mm HG in hypertensive participants with diabetes, heart failure or chronic kidney disease.;Long Term: Maintenance of blood pressure at goal levels.   Lipids Yes   Intervention Provide education and support for participant on nutrition & aerobic/resistive exercise along with prescribed medications to achieve LDL 70mg , HDL >40mg .   Expected Outcomes Short Term: Participant states understanding of  desired cholesterol values and is compliant with medications prescribed. Participant is following exercise prescription and nutrition guidelines.;Long Term: Cholesterol controlled with medications as prescribed, with individualized exercise RX and with personalized nutrition plan. Value goals: LDL < 70mg , HDL > 40 mg.   Stress Yes   Intervention Offer individual and/or small group education and counseling on  adjustment to heart disease, stress management and health-related lifestyle change. Teach and support self-help strategies.   Expected Outcomes Short Term: Participant demonstrates changes in health-related behavior, relaxation and other stress management skills, ability to obtain effective social support, and compliance with psychotropic medications if prescribed.;Long Term: Emotional wellbeing is indicated by absence of clinically significant psychosocial distress or social isolation.   Personal Goal Other Yes   Personal Goal Better walking, less pain   Intervention Provide exercise prescription to increase functional capacity and stregth   Expected Outcomes Increase functional capacity      Core Components/Risk Factors/Patient Goals Review:      Goals and Risk Factor Review      10/11/15 1542           Core Components/Risk Factors/Patient Goals Review   Personal Goals Review Weight Management/Obesity;Sedentary;Increase Strength and Stamina;Hypertension;Stress;Other       Review Weight is up.  Energy is up and down depending on day.  Pain in foot improved, but back flaring up.  He is walking better.  Blood pressures have been stable.  Attended Stress class today.       Expected Outcomes Lose weight, improved energy levels by walking at home more.          Core Components/Risk Factors/Patient Goals at Discharge (Final Review):      Goals and Risk Factor Review - 10/11/15 1542    Core Components/Risk Factors/Patient Goals Review   Personal Goals Review Weight Management/Obesity;Sedentary;Increase Strength and Stamina;Hypertension;Stress;Other   Review Weight is up.  Energy is up and down depending on day.  Pain in foot improved, but back flaring up.  He is walking better.  Blood pressures have been stable.  Attended Stress class today.   Expected Outcomes Lose weight, improved energy levels by walking at home more.      ITP Comments:     ITP Comments      09/21/15 0945            ITP Comments Medical Director-Dr. Fransico Him, MD          Comments: Pt is making expected progress toward personal goals after completing  10 sessions. Recommend continued exercise and life style modification education including  stress management and relaxation techniques to decrease cardiac risk profile.

## 2015-10-19 ENCOUNTER — Other Ambulatory Visit: Payer: Self-pay | Admitting: Cardiology

## 2015-10-19 ENCOUNTER — Ambulatory Visit (INDEPENDENT_AMBULATORY_CARE_PROVIDER_SITE_OTHER): Payer: BLUE CROSS/BLUE SHIELD | Admitting: Cardiology

## 2015-10-19 ENCOUNTER — Encounter: Payer: Self-pay | Admitting: Cardiology

## 2015-10-19 ENCOUNTER — Other Ambulatory Visit: Payer: Self-pay | Admitting: *Deleted

## 2015-10-19 VITALS — BP 118/72 | HR 84 | Ht 69.0 in | Wt 185.6 lb

## 2015-10-19 DIAGNOSIS — E785 Hyperlipidemia, unspecified: Secondary | ICD-10-CM | POA: Diagnosis not present

## 2015-10-19 MED ORDER — ACYCLOVIR 5 % EX OINT: 1.0000 "application " | TOPICAL_OINTMENT | CUTANEOUS | Status: DC | PRN

## 2015-10-19 MED ORDER — ALPRAZOLAM 0.5 MG PO TABS: 0.2500 mg | ORAL_TABLET | Freq: Every evening | ORAL | Status: DC | PRN

## 2015-10-19 NOTE — Progress Notes (Signed)
Cardiology Office Note   Date:  10/19/2015   ID:  Manuel Bell, DOB 26-Jul-1955, MRN [id number]  PCP:  No PCP Per Patient  Cardiologist:   Minus Breeding, MD   No chief complaint on file.     History of Present Illness: Manuel Bell is a 59 y.o. male who presents for follow-up after redo CABG. He presented in January with dyspnea and subsequently had cath demonstrating 205 bypass grafts patent. He underwent redo CABG with a left radial artery to ramus intermediate and SVG to posterior lateral. He did have some postoperative atrial fibrillation. He was on amiodarone for a while but now is been off of this. He's doing well and participating in cardiac rehabilitation. He's had some residual chest soreness and fatigue but no significant shortness of breath. He's not having any substernal chest discomfort, neck or arm discomfort. Not having any palpitations, presyncope or syncope. He denies any PND or orthopnea.  Past Medical History  Diagnosis Date  . Hyperlipidemia   . CAD (coronary artery disease)     LHC 09/11/11: 3 vessel CAD, EF 45-50%  . Hx of CABG 08/2011    Dr. Prescott Gum - LIMA to LAD, SVG-D1, SVG-distal RCA, SVG-RI/distal circumflex  . Postoperative atrial fibrillation ([place]) 08/2011    Amiodarone  . Alcohol abuse   . Complication of anesthesia     slow to awaken  . PONV (postoperative nausea and vomiting)   . Family history of anesthesia complication   . Dysrhythmia     palpations at times  . Asthma     as a child   . GERD (gastroesophageal reflux disease)   . Coronary atherosclerosis of native coronary artery 10/02/2011  . HYPERLIPIDEMIA 11/22/2009    Qualifier: Diagnosis of  By: Linna Darner MD, Gwyndolyn Saxon   Pravastatin 40 mg started post CBAG 08/2011      . HYPERTENSION, ESSENTIAL NOS 08/20/2007    Qualifier: Diagnosis of  By: Linna Darner MD, Monia Pouch, VIRAL HEPATITIS C 01/28/2007    Qualifier: Diagnosis of  By: Linna Darner MD, Carmell Austria, TRANSAMINASE/LDH LEVELS  02/06/2007    Qualifier: Diagnosis of  By: Janelle Floor    . LUMBAR RADICULOPATHY, RIGHT 01/02/2010    Qualifier: Diagnosis of  By: Linna Darner MD, Gwyndolyn Saxon    . Other abnormal glucose 01/07/2012    Fasting blood sugar ranged 103-116 hospitalized March/April 2013.   . S/P CABG x 5 09/24/2011    09/12/2011   . NEURALGIA 11/22/2009    Qualifier: Diagnosis of  By: Linna Darner MD, Gwyndolyn Saxon    . GERD 10/19/2008    Qualifier: Diagnosis of  By: Linna Darner MD, Gwyndolyn Saxon    . Shortness of breath dyspnea   . Arthritis   . Hepatitis C     s/p interferon/[facility]; chronically elevated LFTs. treated 2015- 2016- Completed 07/2014    Past Surgical History  Procedure Laterality Date  . Orchiectomy      left for scar tissue post torsion; Dr Hartley Barefoot  . Tympanic membranes Left     perforation X 3 of  L  TM  ;surgically repaired  . Arthroscopic knee surgery Left     left  . Colonoscopy  2005    negative; [facility]  . Esophagogastroduodenoscopy (egd) with propofol N/A 02/16/2014    Procedure: ESOPHAGOGASTRODUODENOSCOPY (EGD) WITH PROPOFOL;  Surgeon: Winfield Cunas., MD;  Location: [facility] ENDOSCOPY;  Service: Endoscopy;  Laterality: N/A;  H&P in file  .  Cardiac catheterization N/A 07/14/2015    Procedure: Left Heart Cath and Coronary Angiography;  Surgeon: Burnell Blanks, MD;  Location: [facility] LAB;  Service: Cardiovascular;  Laterality: N/A;  . Coronary artery bypass graft  09/12/2011    Procedure: CORONARY ARTERY BYPASS GRAFTING (CABG);  Surgeon: Ivin Poot, MD;  Location: [facility];  Service: Open Heart Surgery;  Laterality: N/A;  . Coronary artery bypass graft N/A 08/01/2015    Procedure: REDO CORONARY ARTERY BYPASS GRAFTING x two (CABG) x   using left radial artery, and left leg greater saphenous vein harvested endoscopically, also performed coronary endarderectomy. Placement of Intra aortic balloon pump (IAB).;  Surgeon: Ivin Poot, MD;  Location: [facility];  Service: Open Heart Surgery;  Laterality: N/A;   . Radial artery harvest Left 08/01/2015    Procedure: RADIAL ARTERY HARVEST;  Surgeon: Ivin Poot, MD;  Location: [facility];  Service: Open Heart Surgery;  Laterality: Left;  . Tee without cardioversion N/A 08/01/2015    Procedure: TRANSESOPHAGEAL ECHOCARDIOGRAM (TEE);  Surgeon: Ivin Poot, MD;  Location: [facility];  Service: Open Heart Surgery;  Laterality: N/A;     Current Outpatient Prescriptions  Medication Sig Dispense Refill  . acyclovir ointment (ZOVIRAX) 5 % Apply 1 application topically as needed (for fever blisters).    . ALPRAZolam (XANAX) 0.5 MG tablet Take 0.5 tablets (0.25 mg total) by mouth at bedtime as needed. 20 tablet 0  . aspirin EC 325 MG EC tablet Take 1 tablet (325 mg total) by mouth daily.    . lansoprazole (PREVACID) 30 MG capsule Take 1 capsule (30 mg total) by mouth daily. 90 capsule 1  . lisinopril (PRINIVIL,ZESTRIL) 5 MG tablet TAKE 1 TABLET EVERY DAY 30 tablet 0  . metoprolol tartrate (LOPRESSOR) 25 MG tablet TAKE 1 TABLET TWICE A DAY 60 tablet 0  . nitroGLYCERIN (NITROSTAT) 0.4 MG SL tablet Place 1 tablet under the tongue.    . Omega-3 Fatty Acids (FISH OIL) 1200 MG CAPS Take 1,200 mg by mouth 2 (two) times daily.     . pravastatin (PRAVACHOL) 40 MG tablet Take 1 tablet (40 mg total) by mouth every evening. 30 tablet 6  . zolpidem (AMBIEN) 10 MG tablet TAKE 1 TABLET EVERY 3RD NIGHT *ONLY AS NEEDED* 30 tablet 0  . [DISCONTINUED] amLODipine (NORVASC) 10 MG tablet Take 1 tablet (10 mg total) by mouth daily. 90 tablet 0   No current facility-administered medications for this visit.    Allergies:   Sulfa antibiotics and Tape    ROS:  Please see the history of present illness.   Otherwise, review of systems are positive for insomnia.   All other systems are reviewed and negative.    PHYSICAL EXAM: VS:  BP 118/72 mmHg  Pulse 84  Ht 5\' 9"  (1.753 m)  Wt 185 lb 9.6 oz (84.188 kg)  BMI 27.40 kg/m2 , BMI Body mass index is 27.4 kg/(m^2). GENERAL:  Well  appearing HEENT:  Pupils equal round and reactive, fundi not visualized, oral mucosa unremarkable NECK:  No jugular venous distention, waveform within normal limits, carotid upstroke brisk and symmetric, no bruits, no thyromegaly LYMPHATICS:  No cervical, inguinal adenopathy LUNGS:  Clear to auscultation bilaterally BACK:  No CVA tenderness CHEST:  Unremarkable HEART:  PMI not displaced or sustained,S1 and S2 within normal limits, no S3, no S4, no clicks, no rubs, no murmurs ABD:  Flat, positive bowel sounds normal in frequency in pitch, no bruits, no rebound, no guarding,  no midline pulsatile mass, no hepatomegaly, no splenomegaly EXT:  2 plus pulses throughout, no edema, no cyanosis no clubbing SKIN:  No rashes no nodules NEURO:  Cranial nerves II through XII grossly intact, motor grossly intact throughout PSYCH:  Cognitively intact, oriented to person place and time    EKG:  EKG is not ordered today.    Recent Labs: 08/02/2015: Magnesium 2.2 08/07/2015: ALT 31; Hemoglobin 9.1*; Platelets 125* 08/08/2015: BUN 18; Creatinine, Ser 0.90; Potassium 4.3; Sodium 136    Lipid Panel    Component Value Date/Time   CHOL 158 06/03/2014 0841   TRIG 66 06/03/2014 0841   HDL 51 06/03/2014 0841   CHOLHDL 3.1 06/03/2014 0841   VLDL 13 06/03/2014 0841   LDLCALC 94 06/03/2014 0841   LDLDIRECT 146.5 11/29/2009 0839      Wt Readings from Last 3 Encounters:  10/19/15 185 lb 9.6 oz (84.188 kg)  09/21/15 183 lb 6.8 oz (83.2 kg)  09/14/15 184 lb 9.6 oz (83.734 kg)      Other studies Reviewed: Additional studies/ records that were reviewed today include: None. Review of the above records demonstrates:  Please see elsewhere in the note.     ASSESSMENT AND PLAN:   S/P CABG x 5 2013 with re CABG x 27 Jul 2015:  Doing well.  We will continue with aggressive secondary risk reduction. He will reduce his aspirin 81 mg daily.  Postoperative atrial fibrillation  :  He has regular rhythm and is off  the amiodarone.  Essential hypertension  The blood pressure is at target. No change in medications is indicated. We will continue with therapeutic lifestyle changes (TLC).  Hyperlipidemia:  I will obtain a lipid profile   Current medicines are reviewed at length with the patient today.  The patient does not have concerns regarding medicines.  The following changes have been made:  no change  Labs/ tests ordered today include:   No orders of the defined types were placed in this encounter.     Disposition:   FU with me in 6 months.     Signed, Minus Breeding, MD  10/19/2015 1:50 PM    [facility]

## 2015-10-19 NOTE — Telephone Encounter (Signed)
Rx has been sent to the pharmacy electronically. ° °

## 2015-10-19 NOTE — Patient Instructions (Signed)
Your physician wants you to follow-up in: 6 Months You will receive a reminder letter in the mail two months in advance. If you don't receive a letter, please call our office to schedule the follow-up appointment.  Your physician recommends that you return for Fasting lab work   Your physician has recommended you make the following change in your medication: Decrease Aspirin 81 mg daily

## 2015-10-19 NOTE — Telephone Encounter (Signed)
REFILL 

## 2015-10-20 ENCOUNTER — Encounter (HOSPITAL_COMMUNITY): Payer: BLUE CROSS/BLUE SHIELD

## 2015-10-20 ENCOUNTER — Telehealth: Payer: Self-pay

## 2015-10-20 NOTE — Telephone Encounter (Signed)
Prior auth for Zovirax 5% ointment submitted to [facility].

## 2015-10-23 ENCOUNTER — Encounter (HOSPITAL_COMMUNITY)
Admission: RE | Admit: 2015-10-23 | Discharge: 2015-10-23 | Disposition: A | Discharge status: Home / Self Care | Payer: BLUE CROSS/BLUE SHIELD | Source: Ambulatory Visit | Attending: Cardiology | Admitting: Cardiology

## 2015-10-23 ENCOUNTER — Encounter (HOSPITAL_COMMUNITY): Payer: BLUE CROSS/BLUE SHIELD

## 2015-10-23 DIAGNOSIS — Z951 Presence of aortocoronary bypass graft: Secondary | ICD-10-CM | POA: Diagnosis not present

## 2015-10-24 ENCOUNTER — Other Ambulatory Visit: Payer: Self-pay | Admitting: Nurse Practitioner

## 2015-10-24 DIAGNOSIS — C220 Liver cell carcinoma: Secondary | ICD-10-CM

## 2015-10-25 ENCOUNTER — Encounter (HOSPITAL_COMMUNITY): Payer: BLUE CROSS/BLUE SHIELD

## 2015-10-25 ENCOUNTER — Encounter (HOSPITAL_COMMUNITY)
Admission: RE | Admit: 2015-10-25 | Discharge: 2015-10-25 | Disposition: A | Discharge status: Home / Self Care | Payer: BLUE CROSS/BLUE SHIELD | Source: Ambulatory Visit | Attending: Cardiology | Admitting: Cardiology

## 2015-10-25 DIAGNOSIS — Z951 Presence of aortocoronary bypass graft: Secondary | ICD-10-CM | POA: Diagnosis not present

## 2015-10-27 ENCOUNTER — Encounter (HOSPITAL_COMMUNITY): Payer: BLUE CROSS/BLUE SHIELD

## 2015-10-27 ENCOUNTER — Encounter (HOSPITAL_COMMUNITY)
Admission: RE | Admit: 2015-10-27 | Discharge: 2015-10-27 | Disposition: A | Discharge status: Home / Self Care | Payer: BLUE CROSS/BLUE SHIELD | Source: Ambulatory Visit | Attending: Cardiology | Admitting: Cardiology

## 2015-10-27 DIAGNOSIS — Z951 Presence of aortocoronary bypass graft: Secondary | ICD-10-CM

## 2015-10-30 ENCOUNTER — Encounter (HOSPITAL_COMMUNITY)
Admission: RE | Admit: 2015-10-30 | Discharge: 2015-10-30 | Disposition: A | Discharge status: Home / Self Care | Payer: BLUE CROSS/BLUE SHIELD | Source: Ambulatory Visit | Attending: Cardiology | Admitting: Cardiology

## 2015-10-30 ENCOUNTER — Encounter (HOSPITAL_COMMUNITY): Payer: BLUE CROSS/BLUE SHIELD

## 2015-10-30 ENCOUNTER — Other Ambulatory Visit: Payer: Self-pay | Admitting: Cardiology

## 2015-10-30 DIAGNOSIS — Z951 Presence of aortocoronary bypass graft: Secondary | ICD-10-CM | POA: Diagnosis not present

## 2015-10-30 NOTE — Telephone Encounter (Signed)
Rx Refill

## 2015-10-31 LAB — CARDIO IQ(R) ADVANCED LIPID PANEL
APOLIPOPROTEIN (CARDIO IQ ADV LIPID PANEL): 84 mg/dL (ref 52–109)
Cholesterol, Total: 173 mg/dL (ref 125–200)
Cholesterol/HDL Ratio: 2.8 calc (ref <=5.0)
HDL CHOLESTEROL (CARDIO IQ ADV LIPID PANEL): 62 mg/dL (ref >=40)
LDL LARGE: 5759 nmol/L (ref 4334–10815)
LDL MEDIUM: 292 nmol/L (ref 167–465)
LDL Particle Number: 1514 nmol/L (ref 1016–2185)
LDL Peak Size: 224.2 Angstrom (ref >=218.2)
LDL SMALL: 238 nmol/L (ref 123–441)
LDL, Calculated: 95 mg/dL
LIPOPROTEIN (A) (CARDIO IQ ADV LIPID PANEL): 126 nmol/L — AB (ref <75)
Non-HDL Cholesterol: 111 mg/dL
Triglycerides: 81 mg/dL

## 2015-11-01 ENCOUNTER — Encounter (HOSPITAL_COMMUNITY): Payer: BLUE CROSS/BLUE SHIELD

## 2015-11-01 ENCOUNTER — Encounter (HOSPITAL_COMMUNITY)
Admission: RE | Admit: 2015-11-01 | Discharge: 2015-11-01 | Disposition: A | Discharge status: Home / Self Care | Payer: BLUE CROSS/BLUE SHIELD | Source: Ambulatory Visit | Attending: Cardiology | Admitting: Cardiology

## 2015-11-01 DIAGNOSIS — Z951 Presence of aortocoronary bypass graft: Secondary | ICD-10-CM | POA: Diagnosis not present

## 2015-11-03 ENCOUNTER — Encounter (HOSPITAL_COMMUNITY): Payer: BLUE CROSS/BLUE SHIELD

## 2015-11-03 ENCOUNTER — Encounter (HOSPITAL_COMMUNITY)
Admission: RE | Admit: 2015-11-03 | Discharge: 2015-11-03 | Disposition: A | Discharge status: Home / Self Care | Payer: BLUE CROSS/BLUE SHIELD | Source: Ambulatory Visit | Attending: Cardiology | Admitting: Cardiology

## 2015-11-03 DIAGNOSIS — Z951 Presence of aortocoronary bypass graft: Secondary | ICD-10-CM

## 2015-11-06 ENCOUNTER — Encounter (HOSPITAL_COMMUNITY)
Admission: RE | Admit: 2015-11-06 | Discharge: 2015-11-06 | Disposition: A | Discharge status: Home / Self Care | Payer: BLUE CROSS/BLUE SHIELD | Source: Ambulatory Visit | Attending: Cardiology | Admitting: Cardiology

## 2015-11-06 ENCOUNTER — Other Ambulatory Visit: Payer: Self-pay | Admitting: Cardiology

## 2015-11-06 ENCOUNTER — Encounter (HOSPITAL_COMMUNITY): Payer: BLUE CROSS/BLUE SHIELD

## 2015-11-06 DIAGNOSIS — Z951 Presence of aortocoronary bypass graft: Secondary | ICD-10-CM | POA: Diagnosis not present

## 2015-11-06 NOTE — Telephone Encounter (Signed)
Rx request sent to pharmacy.  

## 2015-11-08 ENCOUNTER — Encounter (HOSPITAL_COMMUNITY): Payer: BLUE CROSS/BLUE SHIELD

## 2015-11-08 ENCOUNTER — Ambulatory Visit
Admission: RE | Admit: 2015-11-08 | Discharge: 2015-11-08 | Disposition: A | Discharge status: Home / Self Care | Payer: BLUE CROSS/BLUE SHIELD | Source: Ambulatory Visit | Attending: Nurse Practitioner | Admitting: Nurse Practitioner

## 2015-11-08 ENCOUNTER — Encounter (HOSPITAL_COMMUNITY)
Admission: RE | Admit: 2015-11-08 | Discharge: 2015-11-08 | Disposition: A | Discharge status: Home / Self Care | Payer: BLUE CROSS/BLUE SHIELD | Source: Ambulatory Visit | Attending: Cardiology | Admitting: Cardiology

## 2015-11-08 DIAGNOSIS — Z951 Presence of aortocoronary bypass graft: Secondary | ICD-10-CM | POA: Diagnosis not present

## 2015-11-08 DIAGNOSIS — C220 Liver cell carcinoma: Secondary | ICD-10-CM

## 2015-11-10 ENCOUNTER — Encounter (HOSPITAL_COMMUNITY): Payer: BLUE CROSS/BLUE SHIELD

## 2015-11-10 ENCOUNTER — Encounter (HOSPITAL_COMMUNITY)
Admission: RE | Admit: 2015-11-10 | Discharge: 2015-11-10 | Disposition: A | Discharge status: Home / Self Care | Payer: BLUE CROSS/BLUE SHIELD | Source: Ambulatory Visit | Attending: Cardiology | Admitting: Cardiology

## 2015-11-10 DIAGNOSIS — Z951 Presence of aortocoronary bypass graft: Secondary | ICD-10-CM | POA: Diagnosis not present

## 2015-11-13 ENCOUNTER — Encounter (HOSPITAL_COMMUNITY)
Admission: RE | Admit: 2015-11-13 | Discharge: 2015-11-13 | Disposition: A | Discharge status: Home / Self Care | Payer: BLUE CROSS/BLUE SHIELD | Source: Ambulatory Visit | Attending: Cardiology | Admitting: Cardiology

## 2015-11-13 ENCOUNTER — Encounter (HOSPITAL_COMMUNITY): Payer: BLUE CROSS/BLUE SHIELD

## 2015-11-13 DIAGNOSIS — Z951 Presence of aortocoronary bypass graft: Secondary | ICD-10-CM

## 2015-11-15 ENCOUNTER — Encounter (HOSPITAL_COMMUNITY)
Admission: RE | Admit: 2015-11-15 | Discharge: 2015-11-15 | Disposition: A | Discharge status: Home / Self Care | Payer: BLUE CROSS/BLUE SHIELD | Source: Ambulatory Visit | Attending: Cardiology | Admitting: Cardiology

## 2015-11-15 ENCOUNTER — Encounter (HOSPITAL_COMMUNITY): Payer: BLUE CROSS/BLUE SHIELD

## 2015-11-15 DIAGNOSIS — Z951 Presence of aortocoronary bypass graft: Secondary | ICD-10-CM

## 2015-11-16 NOTE — Progress Notes (Signed)
Cardiac Individual Treatment Plan  Patient Details  Name: Manuel Bell MRN: [id number] Date of Birth: 02/02/1956 Referring Provider:        CARDIAC REHAB PHASE II EXERCISE from 10/04/2015 in [place]   Referring Provider  Minus Breeding MD      Initial Encounter Date:       CARDIAC REHAB PHASE II EXERCISE from 10/04/2015 in [place]   Date  09/21/15   Referring Provider  Minus Breeding MD      Visit Diagnosis: S/P CABG x 2  Patient's Home Medications on Admission:  Current outpatient prescriptions:  .  acyclovir ointment (ZOVIRAX) 5 %, Apply 1 application topically as needed (for fever blisters)., Disp: 15 g, Rfl: 0 .  ALPRAZolam (XANAX) 0.5 MG tablet, Take 0.5 tablets (0.25 mg total) by mouth at bedtime as needed., Disp: 30 tablet, Rfl: 0 .  aspirin 81 MG tablet, Take 81 mg by mouth daily., Disp: , Rfl:  .  lansoprazole (PREVACID) 30 MG capsule, TAKE 1 CAPSULE (30 MG TOTAL) BY MOUTH DAILY., Disp: 90 capsule, Rfl: 1 .  lisinopril (PRINIVIL,ZESTRIL) 5 MG tablet, TAKE 1 TABLET EVERY DAY, Disp: 30 tablet, Rfl: 5 .  metoprolol tartrate (LOPRESSOR) 25 MG tablet, TAKE 1 TABLET TWICE A DAY, Disp: 60 tablet, Rfl: 0 .  nitroGLYCERIN (NITROSTAT) 0.4 MG SL tablet, Place 1 tablet under the tongue., Disp: , Rfl:  .  Omega-3 Fatty Acids (FISH OIL) 1200 MG CAPS, Take 1,200 mg by mouth 2 (two) times daily. , Disp: , Rfl:  .  pravastatin (PRAVACHOL) 40 MG tablet, Take 1 tablet (40 mg total) by mouth every evening., Disp: 30 tablet, Rfl: 6 .  zolpidem (AMBIEN) 10 MG tablet, TAKE 1 TABLET EVERY 3RD NIGHT *ONLY AS NEEDED*, Disp: 30 tablet, Rfl: 0 .  [DISCONTINUED] amLODipine (NORVASC) 10 MG tablet, Take 1 tablet (10 mg total) by mouth daily., Disp: 90 tablet, Rfl: 0  Past Medical History: Past Medical History  Diagnosis Date  . Hyperlipidemia   . CAD (coronary artery disease)     LHC 09/11/11: 3 vessel CAD, EF 45-50%  . Hx of  CABG 08/2011    Dr. Prescott Gum - LIMA to LAD, SVG-D1, SVG-distal RCA, SVG-RI/distal circumflex  . Postoperative atrial fibrillation ([place]) 08/2011    Amiodarone  . Alcohol abuse   . Complication of anesthesia     slow to awaken  . PONV (postoperative nausea and vomiting)   . Family history of anesthesia complication   . Dysrhythmia     palpations at times  . Asthma     as a child   . GERD (gastroesophageal reflux disease)   . Coronary atherosclerosis of native coronary artery 10/02/2011  . HYPERLIPIDEMIA 11/22/2009    Qualifier: Diagnosis of  By: Linna Darner MD, Gwyndolyn Saxon   Pravastatin 40 mg started post CBAG 08/2011      . HYPERTENSION, ESSENTIAL NOS 08/20/2007    Qualifier: Diagnosis of  By: Linna Darner MD, Monia Pouch, VIRAL HEPATITIS C 01/28/2007    Qualifier: Diagnosis of  By: Linna Darner MD, Carmell Austria, TRANSAMINASE/LDH LEVELS 02/06/2007    Qualifier: Diagnosis of  By: Janelle Floor    . LUMBAR RADICULOPATHY, RIGHT 01/02/2010    Qualifier: Diagnosis of  By: Linna Darner MD, Gwyndolyn Saxon    . Other abnormal glucose 01/07/2012    Fasting blood sugar ranged 103-116 hospitalized March/April 2013.   . S/P CABG x 5  09/24/2011    09/12/2011   . NEURALGIA 11/22/2009    Qualifier: Diagnosis of  By: Linna Darner MD, Gwyndolyn Saxon    . GERD 10/19/2008    Qualifier: Diagnosis of  By: Linna Darner MD, Gwyndolyn Saxon    . Shortness of breath dyspnea   . Arthritis   . Hepatitis C     s/p interferon/[facility]; chronically elevated LFTs. treated 2015- 2016- Completed 07/2014    Tobacco Use: History  Smoking status  . Former Smoker -- 1.00 packs/day for 30 years  . Types: Cigarettes  . Quit date: 09/10/2003  Smokeless tobacco  . Not on file    Comment: smoked 1971- 2005 (off intermittently 1.5 years), up to 1 ppd     Labs: Recent Review Flowsheet Data    Labs for ITP Cardiac and Pulmonary Rehab Latest Ref Rng 08/03/2015 08/03/2015 08/04/2015 08/04/2015 10/26/2015   Cholestrol 125 - 200 mg/dL - - - - 173   LDLCALC - - - - - 95    HDL >=40 mg/dL - - - - 62   Trlycerides - - - - - 81   TCO2 0 - 100 mmol/L - 27 - - -   O2SAT - 52.6 - TEST WILL BE CREDITED 58.6 -      Capillary Blood Glucose: Lab Results  Component Value Date   GLUCAP 131* 08/08/2015   GLUCAP 97 08/07/2015   GLUCAP 126* 08/07/2015   GLUCAP 114* 08/07/2015   GLUCAP 125* 08/07/2015     Exercise Target Goals:    Exercise Program Goal: Individual exercise prescription set with THRR, safety & activity barriers. Participant demonstrates ability to understand and report RPE using BORG scale, to self-measure pulse accurately, and to acknowledge the importance of the exercise prescription.  Exercise Prescription Goal: Starting with aerobic activity 30 plus minutes a day, 3 days per week for initial exercise prescription. Provide home exercise prescription and guidelines that participant acknowledges understanding prior to discharge.  Activity Barriers & Risk Stratification:     Activity Barriers & Cardiac Risk Stratification - 09/21/15 0831    Activity Barriers & Cardiac Risk Stratification   Activity Barriers Deconditioning;Incisional Pain;Muscular Weakness;Other (comment)   Comments Pain in left heel for past few days with walking   Cardiac Risk Stratification High      6 Minute Walk:     6 Minute Walk      09/21/15 0820       6 Minute Walk   Phase Initial     Distance 1698 feet     Walk Time 6 minutes     # of Rest Breaks 0     MPH 3.22     METS 4.09     RPE 11     VO2 Peak 14.33     Symptoms Yes (comment)     Comments c/p right heel pain     Resting HR 77 bpm     Resting BP 126/62 mmHg     Max Ex. HR 92 bpm     Max Ex. BP 142/84 mmHg     2 Minute Post BP 134/70 mmHg        Initial Exercise Prescription:     Initial Exercise Prescription - 10/05/15 1400    Date of Initial Exercise RX and Referring Provider   Date 09/21/15   Referring Provider Minus Breeding MD   Bike   Level 1.4   Minutes 10   METs 4   NuStep    Level 3   Minutes  10   METs 2   Track   Laps 10   Minutes 10   METs 2.76   Prescription Details   Frequency (times per week) 3   Intensity   THRR 40-80% of Max Heartrate 64-127   Ratings of Perceived Exertion 11-13   Progression   Progression Continue to progress workloads to maintain intensity without signs/symptoms of physical distress.   Resistance Training   Training Prescription Yes   Weight 2 lbs   Reps 10-12      Perform Capillary Blood Glucose checks as needed.  Exercise Prescription Changes:     Exercise Prescription Changes      10/04/15 1500 10/10/15 0800 10/24/15 1000 11/10/15 1445     Exercise Review   Progression  Yes Yes Yes    Response to Exercise   Blood Pressure (Admit)  124/70 mmHg 140/88 mmHg 158/86 mmHg    Blood Pressure (Exercise)  138/70 mmHg 140/80 mmHg 164/84 mmHg    Blood Pressure (Exit)  124/84 mmHg 120/70 mmHg 140/80 mmHg    Heart Rate (Admit)  76 bpm 83 bpm 76 bpm    Heart Rate (Exercise)  112 bpm 111 bpm 120 bpm    Heart Rate (Exit)  72 bpm 77 bpm 76 bpm    Oxygen Saturation (Exercise)  100 %      Rating of Perceived Exertion (Exercise)  14 12 12     Comments Home Exercise Given 10/04/15 Home Exercise Given 10/04/15 Home Exercise Given 10/04/15 Home Exercise Given 10/04/15    Duration  Progress to 30 minutes of continuous aerobic without signs/symptoms of physical distress Progress to 30 minutes of continuous aerobic without signs/symptoms of physical distress Progress to 30 minutes of continuous aerobic without signs/symptoms of physical distress    Intensity  THRR unchanged THRR unchanged THRR unchanged    Progression   Progression  Continue to progress workloads to maintain intensity without signs/symptoms of physical distress. Continue to progress workloads to maintain intensity without signs/symptoms of physical distress. Continue to progress workloads to maintain intensity without signs/symptoms of physical distress.    Average METs  3.5  3.8 3.9    Resistance Training   Training Prescription Yes Yes Yes Yes    Weight 2 lbs 4lbs 4lbs 5lbs    Reps 10-12 10-12 10-12 10-12    Interval Training   Interval Training  No No No    Bike   Level 1.4 1.4 1.4 1.4    Minutes 10 10 10 10     METs 4 4.17 4.12 4.13    NuStep   Level 3 3 3 3     Watts    3.8    Minutes 10 20 10 10     METs 2 3.3 3.4 3.8    Track   Laps 10 --  No walking this time due to plantar fasciitis 16 16    Minutes 10  10 10     METs 2.76  3.76 3.79    Home Exercise Plan   Plans to continue exercise at [place]    Frequency Add 3 additional days to program exercise sessions. Add 3 additional days to program exercise sessions. Add 3 additional days to program exercise sessions. Add 3 additional days to program exercise sessions.       Exercise Comments:     Exercise Comments      10/10/15 0849 10/24/15 1052 11/13/15 0731  Exercise Comments Pt is tolerating exercise well.  He has not been able to walk for exercise recently due to a flare up on plantar fasciitis.  It is improving and he hopes to return to walking this week. Pt has returned to walking track for second station with no complaints.  He continues to do well. Pt doing well with exercise at cardiac rehab. Not consistently doing home exercise, but plans to exercise more regularly at home in addition to CR.        Discharge Exercise Prescription (Final Exercise Prescription Changes):     Exercise Prescription Changes - 11/10/15 1445    Exercise Review   Progression Yes   Response to Exercise   Blood Pressure (Admit) 158/86 mmHg   Blood Pressure (Exercise) 164/84 mmHg   Blood Pressure (Exit) 140/80 mmHg   Heart Rate (Admit) 76 bpm   Heart Rate (Exercise) 120 bpm   Heart Rate (Exit) 76 bpm   Rating of Perceived Exertion (Exercise) 12   Comments Home Exercise Given 10/04/15   Duration Progress to 30  minutes of continuous aerobic without signs/symptoms of physical distress   Intensity THRR unchanged   Progression   Progression Continue to progress workloads to maintain intensity without signs/symptoms of physical distress.   Average METs 3.9   Resistance Training   Training Prescription Yes   Weight 5lbs   Reps 10-12   Interval Training   Interval Training No   Bike   Level 1.4   Minutes 10   METs 4.13   NuStep   Level 3   Watts 3.8   Minutes 10   METs 3.8   Track   Laps 16   Minutes 10   METs 3.79   Home Exercise Plan   Plans to continue exercise at Home   Frequency Add 3 additional days to program exercise sessions.      Nutrition:  Target Goals: Understanding of nutrition guidelines, daily intake of sodium 1500mg , cholesterol 200mg , calories 30% from fat and 7% or less from saturated fats, daily to have 5 or more servings of fruits and vegetables.  Biometrics:     Pre Biometrics - 09/21/15 1021    Pre Biometrics   Height 5' 8.25" (1.734 m)   Weight 183 lb 6.8 oz (83.2 kg)   Waist Circumference 37 inches   Hip Circumference 37 inches   Waist to Hip Ratio 1 %   BMI (Calculated) 27.7   Triceps Skinfold 12 mm   % Body Fat 25 %   Grip Strength 42 kg   Flexibility 8 in   Single Leg Stand 3.51 seconds       Nutrition Therapy Plan and Nutrition Goals:     Nutrition Therapy & Goals - 09/21/15 1432    Nutrition Therapy   Diet Therapeutic Lifestyle Changes   Personal Nutrition Goals   Personal Goal #1 Maintain current wt while in [place], educate and counsel regarding individualized specific dietary modifications aiming towards targeted core components such as weight, hypertension, lipid management, diabetes, heart failure and other comorbidities.   Expected Outcomes Short Term Goal: Understand basic principles of dietary content, such as calories, fat, sodium, cholesterol and nutrients.;Long Term Goal:  Adherence to prescribed nutrition plan.      Nutrition Discharge: Nutrition Scores:     Nutrition Assessments - 10/16/15 1553    MEDFICTS Scores   Pre Score 36      Nutrition Goals Re-Evaluation:  Psychosocial: Target Goals: Acknowledge presence or absence of depression, maximize coping skills, provide positive support system. Participant is able to verbalize types and ability to use techniques and skills needed for reducing stress and depression.  Initial Review & Psychosocial Screening:     Initial Psych Review & Screening - 09/21/15 [place]? Yes   Barriers   Psychosocial barriers to participate in program There are no identifiable barriers or psychosocial needs.   Screening Interventions   Interventions Encouraged to exercise      Quality of Life Scores:     Quality of Life - 09/25/15 1632    Quality of Life Scores   Health/Function Pre --  Abe People has a nephew he is concerned about who has heart problems   Family Pre --  Abe People has support of his friends since his OHS      PHQ-9:     Recent Review Flowsheet Data    Depression screen [facility] 2/9 09/25/2015 08/29/2015 10/14/2011   Decreased Interest 0 0 0   Down, Depressed, Hopeless 0 0 0   PHQ - 2 Score 0 0 0      Psychosocial Evaluation and Intervention:   Psychosocial Re-Evaluation:     Psychosocial Re-Evaluation      10/19/15 1233 11/16/15 1344         Psychosocial Re-Evaluation   Interventions Encouraged to attend Cardiac Rehabilitation for the exercise Encouraged to attend Cardiac Rehabilitation for the exercise      Continued Psychosocial Services Needed No No         Vocational Rehabilitation: Provide vocational rehab assistance to qualifying candidates.   Vocational Rehab Evaluation & Intervention:     Vocational Rehab - 09/21/15 1545    Initial Vocational Rehab Evaluation & Intervention   Assessment shows need for Vocational Rehabilitation No       Education: Education Goals: Education classes will be provided on a weekly basis, covering required topics. Participant will state understanding/return demonstration of topics presented.  Learning Barriers/Preferences:   Education Topics: Count Your Pulse:  -Group instruction provided by verbal instruction, demonstration, patient participation and written materials to support subject.  Instructors address importance of being able to find your pulse and how to count your pulse when at home without a heart monitor.  Patients get hands on experience counting their pulse with staff help and individually.          CARDIAC REHAB PHASE II EXERCISE from 11/15/2015 in [place]   Date  10/27/15   Educator  Andi Hence   Instruction Review Code  R- Review/reinforce [Second Class for practice]      Heart Attack, Angina, and Risk Factor Modification:  -Group instruction provided by verbal instruction, video, and written materials to support subject.  Instructors address signs and symptoms of angina and heart attacks.    Also discuss risk factors for heart disease and how to make changes to improve heart health risk factors.   Functional Fitness:  -Group instruction provided by verbal instruction, demonstration, patient participation, and written materials to support subject.  Instructors address safety measures for doing things around the house.  Discuss how to get up and down off the floor, how to pick things up properly, how to safely get out of a chair without assistance, and balance training.      CARDIAC REHAB PHASE II EXERCISE from 11/15/2015 in [place]   Date  11/10/15   Educator  Seward Carol   Instruction Review Code  2- meets goals/outcomes      Meditation and Mindfulness:  -Group instruction provided by verbal instruction, patient participation, and written materials to support subject.  Instructor addresses  importance of mindfulness and meditation practice to help reduce stress and improve awareness.  Instructor also leads participants through a meditation exercise.       CARDIAC REHAB PHASE II EXERCISE from 11/15/2015 in [place]   Date  11/15/15   Educator  Jeanella Craze   Instruction Review Code  2- meets goals/outcomes      Stretching for Flexibility and Mobility:  -Group instruction provided by verbal instruction, patient participation, and written materials to support subject.  Instructors lead participants through series of stretches that are designed to increase flexibility thus improving mobility.  These stretches are additional exercise for major muscle groups that are typically performed during regular warm up and cool down.      CARDIAC REHAB PHASE II EXERCISE from 11/15/2015 in [place]   Date  10/06/15   Educator  [place]   Instruction Review Code  2- meets goals/outcomes      Hands Only CPR Anytime:  -Group instruction provided by verbal instruction, video, patient participation and written materials to support subject.  Instructors co-teach with AHA video for hands only CPR.  Participants get hands on experience with mannequins.   Nutrition I class: Heart Healthy Eating:  -Group instruction provided by PowerPoint slides, verbal discussion, and written materials to support subject matter. The instructor gives an explanation and review of the Therapeutic Lifestyle Changes diet recommendations, which includes a discussion on lipid goals, dietary fat, sodium, fiber, plant stanol/sterol esters, sugar, and the components of a well-balanced, healthy diet.   Nutrition II class: Lifestyle Skills:  -Group instruction provided by PowerPoint slides, verbal discussion, and written materials to support subject matter. The instructor gives an explanation and review of label reading, grocery shopping for heart health, heart  healthy recipe modifications, and ways to make healthier choices when eating out.      CARDIAC REHAB PHASE II EXERCISE from 11/15/2015 in [place]   Date  10/16/15   Educator  RD   Instruction Review Code  Not applicable [handouts given]      Diabetes Question & Answer:  -Group instruction provided by PowerPoint slides, verbal discussion, and written materials to support subject matter. The instructor gives an explanation and review of diabetes co-morbidities, pre- and post-prandial blood glucose goals, pre-exercise blood glucose goals, signs, symptoms, and treatment of hypoglycemia and hyperglycemia, and foot care basics.   Diabetes Blitz:  -Group instruction provided by PowerPoint slides, verbal discussion, and written materials to support subject matter. The instructor gives an explanation and review of the physiology behind type 1 and type 2 diabetes, diabetes medications and rational behind using different medications, pre- and post-prandial blood glucose recommendations and Hemoglobin A1c goals, diabetes diet, and exercise including blood glucose guidelines for exercising safely.    Portion Distortion:  -Group instruction provided by PowerPoint slides, verbal discussion, written materials, and food models to support subject matter. The instructor gives an explanation of serving size versus portion size, changes in portions sizes over the last 20 years, and what consists of a serving from each food group.      CARDIAC REHAB PHASE II EXERCISE from 11/15/2015 in [place]   Date  10/18/15   Educator  RD   Instruction Review Code  2- meets goals/outcomes      Stress Management:  -Group instruction provided by verbal instruction, video, and written materials to support subject matter.  Instructors review role of stress in heart disease and how to cope with stress positively.        CARDIAC REHAB PHASE II EXERCISE from  11/15/2015 in [place]   Date  10/11/15   Instruction Review Code  2- meets goals/outcomes      Exercising on Your Own:  -Group instruction provided by verbal instruction, power point, and written materials to support subject.  Instructors discuss benefits of exercise, components of exercise, frequency and intensity of exercise, and end points for exercise.  Also discuss use of nitroglycerin and activating EMS.  Review options of places to exercise outside of rehab.  Review guidelines for sex with heart disease.      CARDIAC REHAB PHASE II EXERCISE from 11/15/2015 in [place]   Date  09/27/15   Instruction Review Code  2- meets goals/outcomes      Cardiac Drugs I:  -Group instruction provided by verbal instruction and written materials to support subject.  Instructor reviews cardiac drug classes: antiplatelets, anticoagulants, beta blockers, and statins.  Instructor discusses reasons, side effects, and lifestyle considerations for each drug class.      CARDIAC REHAB PHASE II EXERCISE from 11/15/2015 in [place]   Date  10/04/15   Educator  pharm D   Instruction Review Code  2- meets goals/outcomes      Cardiac Drugs II:  -Group instruction provided by verbal instruction and written materials to support subject.  Instructor reviews cardiac drug classes: angiotensin converting enzyme inhibitors (ACE-I), angiotensin II receptor blockers (ARBs), nitrates, and calcium channel blockers.  Instructor discusses reasons, side effects, and lifestyle considerations for each drug class.      CARDIAC REHAB PHASE II EXERCISE from 11/15/2015 in [place]   Date  11/01/15   Instruction Review Code  2- meets goals/outcomes      Anatomy and Physiology of the Circulatory System:  -Group instruction provided by verbal instruction, video, and written materials to support  subject.  Reviews functional anatomy of heart, how it relates to various diagnoses, and what role the heart plays in the overall system.      CARDIAC REHAB PHASE II EXERCISE from 11/15/2015 in [place]   Date  11/08/15   Instruction Review Code  2- meets goals/outcomes      Knowledge Questionnaire Score:     Knowledge Questionnaire Score - 09/21/15 1021    Knowledge Questionnaire Score   Pre Score 21/24      Core Components/Risk Factors/Patient Goals at Admission:     Personal Goals and Risk Factors at Admission - 09/21/15 0832    Core Components/Risk Factors/Patient Goals on Admission    Weight Management Yes   Intervention Weight Management: Develop a combined nutrition and exercise program designed to reach desired caloric intake, while maintaining appropriate intake of nutrient and fiber, sodium and fats, and appropriate energy expenditure required for the weight goal.;Weight Management/Obesity: Establish reasonable short term and long term weight goals.   Admit Weight 183 lb 6.8 oz (83.2 kg)   Goal Weight: Short Term 180 lb (81.647 kg)   Goal Weight: Long Term 173 lb (78.472 kg)   Expected Outcomes Short Term:  Continue to assess and modify interventions until short term weight is achieved;Long Term: Adherence to nutrition and physical activity/exercise program aimed toward attainment of established weight goal;Weight Loss: Understanding of general recommendations for a balanced deficit meal plan, which promotes 1-2 lb weight loss per week and includes a negative energy balance of [phone] kcal/d;Understanding recommendations for meals to include 15-35% energy as protein, 25-35% energy from fat, 35-60% energy from carbohydrates, less than 200mg  of dietary cholesterol, 20-35 gm of total fiber daily;Understanding of distribution of calorie intake throughout the day with the consumption of 4-5 meals/snacks   Increase Strength and Stamina Yes  More energy,  Better walking   Intervention Provide advice, education, support and counseling about physical activity/exercise needs.;Develop an individualized exercise prescription for aerobic and resistive training based on initial evaluation findings, risk stratification, comorbidities and participant's personal goals.   Expected Outcomes Achievement of increased cardiorespiratory fitness and enhanced flexibility, muscular endurance and strength shown through measurements of functional capacity and personal statement of participant.   Hypertension Yes   Intervention Provide education on lifestyle modifcations including regular physical activity/exercise, weight management, moderate sodium restriction and increased consumption of fresh fruit, vegetables, and low fat dairy, alcohol moderation, and smoking cessation.;Monitor prescription use compliance.   Expected Outcomes Short Term: Continued assessment and intervention until BP is < 140/31mm HG in hypertensive participants. < 130/67mm HG in hypertensive participants with diabetes, heart failure or chronic kidney disease.;Long Term: Maintenance of blood pressure at goal levels.   Lipids Yes   Intervention Provide education and support for participant on nutrition & aerobic/resistive exercise along with prescribed medications to achieve LDL 70mg , HDL >40mg .   Expected Outcomes Short Term: Participant states understanding of desired cholesterol values and is compliant with medications prescribed. Participant is following exercise prescription and nutrition guidelines.;Long Term: Cholesterol controlled with medications as prescribed, with individualized exercise RX and with personalized nutrition plan. Value goals: LDL < 70mg , HDL > 40 mg.   Stress Yes   Intervention Offer individual and/or small group education and counseling on adjustment to heart disease, stress management and health-related lifestyle change. Teach and support self-help strategies.   Expected  Outcomes Short Term: Participant demonstrates changes in health-related behavior, relaxation and other stress management skills, ability to obtain effective social support, and compliance with psychotropic medications if prescribed.;Long Term: Emotional wellbeing is indicated by absence of clinically significant psychosocial distress or social isolation.   Personal Goal Other Yes   Personal Goal Better walking, less pain   Intervention Provide exercise prescription to increase functional capacity and stregth   Expected Outcomes Increase functional capacity      Core Components/Risk Factors/Patient Goals Review:      Goals and Risk Factor Review      10/11/15 1542           Core Components/Risk Factors/Patient Goals Review   Personal Goals Review Weight Management/Obesity;Sedentary;Increase Strength and Stamina;Hypertension;Stress;Other       Review Weight is up.  Energy is up and down depending on day.  Pain in foot improved, but back flaring up.  He is walking better.  Blood pressures have been stable.  Attended Stress class today.       Expected Outcomes Lose weight, improved energy levels by walking at home more.          Core Components/Risk Factors/Patient Goals at Discharge (Final Review):      Goals and Risk Factor Review - 10/11/15 1542    Core Components/Risk Factors/Patient Goals Review   Personal Goals  Review Weight Management/Obesity;Sedentary;Increase Strength and Stamina;Hypertension;Stress;Other   Review Weight is up.  Energy is up and down depending on day.  Pain in foot improved, but back flaring up.  He is walking better.  Blood pressures have been stable.  Attended Stress class today.   Expected Outcomes Lose weight, improved energy levels by walking at home more.      ITP Comments:     ITP Comments      09/21/15 0945           ITP Comments Medical Director-Dr. Fransico Him, MD          Comments: Pt is making expected progress toward personal goals  after completing 22 sessions. Recommend continued exercise and life style modification education including  stress management and relaxation techniques to decrease cardiac risk profile. Barnet Pall, RN,BSN 11/16/2015 1:46 PM

## 2015-11-17 ENCOUNTER — Encounter (HOSPITAL_COMMUNITY): Payer: BLUE CROSS/BLUE SHIELD

## 2015-11-17 ENCOUNTER — Encounter (HOSPITAL_COMMUNITY)
Admission: RE | Admit: 2015-11-17 | Discharge: 2015-11-17 | Disposition: A | Discharge status: Home / Self Care | Payer: BLUE CROSS/BLUE SHIELD | Source: Ambulatory Visit | Attending: Cardiology | Admitting: Cardiology

## 2015-11-17 DIAGNOSIS — Z951 Presence of aortocoronary bypass graft: Secondary | ICD-10-CM

## 2015-11-20 ENCOUNTER — Encounter (HOSPITAL_COMMUNITY): Payer: BLUE CROSS/BLUE SHIELD

## 2015-11-22 ENCOUNTER — Encounter (HOSPITAL_COMMUNITY)
Admission: RE | Admit: 2015-11-22 | Discharge: 2015-11-22 | Disposition: A | Discharge status: Home / Self Care | Payer: BLUE CROSS/BLUE SHIELD | Source: Ambulatory Visit | Attending: Cardiology | Admitting: Cardiology

## 2015-11-22 ENCOUNTER — Encounter (HOSPITAL_COMMUNITY): Payer: BLUE CROSS/BLUE SHIELD

## 2015-11-22 DIAGNOSIS — Z951 Presence of aortocoronary bypass graft: Secondary | ICD-10-CM | POA: Diagnosis not present

## 2015-11-24 ENCOUNTER — Encounter (HOSPITAL_COMMUNITY)
Admission: RE | Admit: 2015-11-24 | Discharge: 2015-11-24 | Disposition: A | Discharge status: Home / Self Care | Payer: BLUE CROSS/BLUE SHIELD | Source: Ambulatory Visit | Attending: Cardiology | Admitting: Cardiology

## 2015-11-24 ENCOUNTER — Encounter (HOSPITAL_COMMUNITY): Payer: BLUE CROSS/BLUE SHIELD

## 2015-11-24 DIAGNOSIS — Z951 Presence of aortocoronary bypass graft: Secondary | ICD-10-CM

## 2015-11-27 ENCOUNTER — Encounter (HOSPITAL_COMMUNITY): Payer: BLUE CROSS/BLUE SHIELD

## 2015-11-27 ENCOUNTER — Encounter (HOSPITAL_COMMUNITY)
Admission: RE | Admit: 2015-11-27 | Discharge: 2015-11-27 | Disposition: A | Discharge status: Home / Self Care | Payer: BLUE CROSS/BLUE SHIELD | Source: Ambulatory Visit | Attending: Cardiology | Admitting: Cardiology

## 2015-11-27 DIAGNOSIS — Z951 Presence of aortocoronary bypass graft: Secondary | ICD-10-CM | POA: Diagnosis not present

## 2015-11-28 ENCOUNTER — Other Ambulatory Visit: Payer: Self-pay | Admitting: Cardiology

## 2015-11-28 NOTE — Telephone Encounter (Signed)
Rx(s) sent to pharmacy electronically.  

## 2015-11-29 ENCOUNTER — Encounter (HOSPITAL_COMMUNITY): Payer: BLUE CROSS/BLUE SHIELD

## 2015-11-29 ENCOUNTER — Encounter (HOSPITAL_COMMUNITY)
Admission: RE | Admit: 2015-11-29 | Discharge: 2015-11-29 | Disposition: A | Discharge status: Home / Self Care | Payer: BLUE CROSS/BLUE SHIELD | Source: Ambulatory Visit | Attending: Cardiology | Admitting: Cardiology

## 2015-11-29 DIAGNOSIS — Z951 Presence of aortocoronary bypass graft: Secondary | ICD-10-CM

## 2015-12-01 ENCOUNTER — Encounter (HOSPITAL_COMMUNITY)
Admission: RE | Admit: 2015-12-01 | Discharge: 2015-12-01 | Disposition: A | Discharge status: Home / Self Care | Payer: BLUE CROSS/BLUE SHIELD | Source: Ambulatory Visit | Attending: Cardiology | Admitting: Cardiology

## 2015-12-01 ENCOUNTER — Encounter (HOSPITAL_COMMUNITY): Payer: BLUE CROSS/BLUE SHIELD

## 2015-12-01 DIAGNOSIS — Z951 Presence of aortocoronary bypass graft: Secondary | ICD-10-CM | POA: Diagnosis not present

## 2015-12-04 ENCOUNTER — Encounter (HOSPITAL_COMMUNITY): Payer: BLUE CROSS/BLUE SHIELD

## 2015-12-04 ENCOUNTER — Encounter (HOSPITAL_COMMUNITY)
Admission: RE | Admit: 2015-12-04 | Discharge: 2015-12-04 | Disposition: A | Discharge status: Home / Self Care | Payer: BLUE CROSS/BLUE SHIELD | Source: Ambulatory Visit | Attending: Cardiology | Admitting: Cardiology

## 2015-12-04 DIAGNOSIS — Z951 Presence of aortocoronary bypass graft: Secondary | ICD-10-CM | POA: Diagnosis not present

## 2015-12-06 ENCOUNTER — Encounter (HOSPITAL_COMMUNITY): Payer: BLUE CROSS/BLUE SHIELD

## 2015-12-06 ENCOUNTER — Encounter (HOSPITAL_COMMUNITY)
Admission: RE | Admit: 2015-12-06 | Discharge: 2015-12-06 | Disposition: A | Discharge status: Home / Self Care | Payer: BLUE CROSS/BLUE SHIELD | Source: Ambulatory Visit | Attending: Cardiology | Admitting: Cardiology

## 2015-12-06 DIAGNOSIS — Z951 Presence of aortocoronary bypass graft: Secondary | ICD-10-CM | POA: Diagnosis not present

## 2015-12-08 ENCOUNTER — Encounter (HOSPITAL_COMMUNITY)
Admission: RE | Admit: 2015-12-08 | Discharge: 2015-12-08 | Disposition: A | Discharge status: Home / Self Care | Payer: BLUE CROSS/BLUE SHIELD | Source: Ambulatory Visit | Attending: Cardiology | Admitting: Cardiology

## 2015-12-08 ENCOUNTER — Encounter (HOSPITAL_COMMUNITY): Payer: BLUE CROSS/BLUE SHIELD

## 2015-12-08 DIAGNOSIS — Z951 Presence of aortocoronary bypass graft: Secondary | ICD-10-CM

## 2015-12-11 ENCOUNTER — Encounter (HOSPITAL_COMMUNITY): Payer: BLUE CROSS/BLUE SHIELD

## 2015-12-11 ENCOUNTER — Encounter (HOSPITAL_COMMUNITY)
Admission: RE | Admit: 2015-12-11 | Discharge: 2015-12-11 | Disposition: A | Discharge status: Home / Self Care | Payer: BLUE CROSS/BLUE SHIELD | Source: Ambulatory Visit | Attending: Cardiology | Admitting: Cardiology

## 2015-12-11 DIAGNOSIS — Z951 Presence of aortocoronary bypass graft: Secondary | ICD-10-CM

## 2015-12-13 ENCOUNTER — Encounter (HOSPITAL_COMMUNITY): Payer: BLUE CROSS/BLUE SHIELD

## 2015-12-13 ENCOUNTER — Encounter (HOSPITAL_COMMUNITY)
Admission: RE | Admit: 2015-12-13 | Discharge: 2015-12-13 | Disposition: A | Discharge status: Home / Self Care | Payer: BLUE CROSS/BLUE SHIELD | Source: Ambulatory Visit | Attending: Cardiology | Admitting: Cardiology

## 2015-12-13 DIAGNOSIS — Z951 Presence of aortocoronary bypass graft: Secondary | ICD-10-CM | POA: Diagnosis not present

## 2015-12-14 NOTE — Progress Notes (Signed)
Cardiac Individual Treatment Plan  Patient Details  Name: Manuel Bell MRN: [id number] Date of Birth: 1956/03/31 Referring Provider:        CARDIAC REHAB PHASE II EXERCISE from 10/04/2015 in [place]   Referring Provider  Minus Breeding MD      Initial Encounter Date:       CARDIAC REHAB PHASE II EXERCISE from 10/04/2015 in [place]   Date  09/21/15   Referring Provider  Minus Breeding MD      Visit Diagnosis: S/P CABG x 2  Patient's Home Medications on Admission:  Current outpatient prescriptions:  .  acyclovir ointment (ZOVIRAX) 5 %, Apply 1 application topically as needed (for fever blisters)., Disp: 15 g, Rfl: 0 .  ALPRAZolam (XANAX) 0.5 MG tablet, Take 0.5 tablets (0.25 mg total) by mouth at bedtime as needed., Disp: 30 tablet, Rfl: 0 .  aspirin 81 MG tablet, Take 81 mg by mouth daily., Disp: , Rfl:  .  lansoprazole (PREVACID) 30 MG capsule, TAKE 1 CAPSULE (30 MG TOTAL) BY MOUTH DAILY., Disp: 90 capsule, Rfl: 1 .  lisinopril (PRINIVIL,ZESTRIL) 5 MG tablet, TAKE 1 TABLET EVERY DAY, Disp: 30 tablet, Rfl: 5 .  metoprolol tartrate (LOPRESSOR) 25 MG tablet, TAKE 1 TABLET TWICE A DAY, Disp: 60 tablet, Rfl: 10 .  nitroGLYCERIN (NITROSTAT) 0.4 MG SL tablet, Place 1 tablet under the tongue., Disp: , Rfl:  .  Omega-3 Fatty Acids (FISH OIL) 1200 MG CAPS, Take 1,200 mg by mouth 2 (two) times daily. , Disp: , Rfl:  .  pravastatin (PRAVACHOL) 40 MG tablet, Take 1 tablet (40 mg total) by mouth every evening., Disp: 30 tablet, Rfl: 6 .  zolpidem (AMBIEN) 10 MG tablet, TAKE 1 TABLET EVERY 3RD NIGHT *ONLY AS NEEDED*, Disp: 30 tablet, Rfl: 0 .  [DISCONTINUED] amLODipine (NORVASC) 10 MG tablet, Take 1 tablet (10 mg total) by mouth daily., Disp: 90 tablet, Rfl: 0  Past Medical History: Past Medical History  Diagnosis Date  . Hyperlipidemia   . CAD (coronary artery disease)     LHC 09/11/11: 3 vessel CAD, EF 45-50%  . Hx of  CABG 08/2011    Dr. Prescott Gum - LIMA to LAD, SVG-D1, SVG-distal RCA, SVG-RI/distal circumflex  . Postoperative atrial fibrillation ([place]) 08/2011    Amiodarone  . Alcohol abuse   . Complication of anesthesia     slow to awaken  . PONV (postoperative nausea and vomiting)   . Family history of anesthesia complication   . Dysrhythmia     palpations at times  . Asthma     as a child   . GERD (gastroesophageal reflux disease)   . Coronary atherosclerosis of native coronary artery 10/02/2011  . HYPERLIPIDEMIA 11/22/2009    Qualifier: Diagnosis of  By: Linna Darner MD, Gwyndolyn Saxon   Pravastatin 40 mg started post CBAG 08/2011      . HYPERTENSION, ESSENTIAL NOS 08/20/2007    Qualifier: Diagnosis of  By: Linna Darner MD, Monia Pouch, VIRAL HEPATITIS C 01/28/2007    Qualifier: Diagnosis of  By: Linna Darner MD, Carmell Austria, TRANSAMINASE/LDH LEVELS 02/06/2007    Qualifier: Diagnosis of  By: Janelle Floor    . LUMBAR RADICULOPATHY, RIGHT 01/02/2010    Qualifier: Diagnosis of  By: Linna Darner MD, Gwyndolyn Saxon    . Other abnormal glucose 01/07/2012    Fasting blood sugar ranged 103-116 hospitalized March/April 2013.   . S/P CABG x 5  09/24/2011    09/12/2011   . NEURALGIA 11/22/2009    Qualifier: Diagnosis of  By: Linna Darner MD, Gwyndolyn Saxon    . GERD 10/19/2008    Qualifier: Diagnosis of  By: Linna Darner MD, Gwyndolyn Saxon    . Shortness of breath dyspnea   . Arthritis   . Hepatitis C     s/p interferon/[facility]; chronically elevated LFTs. treated 2015- 2016- Completed 07/2014    Tobacco Use: History  Smoking status  . Former Smoker -- 1.00 packs/day for 30 years  . Types: Cigarettes  . Quit date: 09/10/2003  Smokeless tobacco  . Not on file    Comment: smoked 1971- 2005 (off intermittently 1.5 years), up to 1 ppd     Labs: Recent Review Flowsheet Data    Labs for ITP Cardiac and Pulmonary Rehab Latest Ref Rng 08/03/2015 08/03/2015 08/04/2015 08/04/2015 10/26/2015   Cholestrol 125 - 200 mg/dL - - - - 173   LDLCALC - - - - - 95    HDL >=40 mg/dL - - - - 62   Trlycerides - - - - - 81   TCO2 0 - 100 mmol/L - 27 - - -   O2SAT - 52.6 - TEST WILL BE CREDITED 58.6 -      Capillary Blood Glucose: Lab Results  Component Value Date   GLUCAP 131* 08/08/2015   GLUCAP 97 08/07/2015   GLUCAP 126* 08/07/2015   GLUCAP 114* 08/07/2015   GLUCAP 125* 08/07/2015     Exercise Target Goals:    Exercise Program Goal: Individual exercise prescription set with THRR, safety & activity barriers. Participant demonstrates ability to understand and report RPE using BORG scale, to self-measure pulse accurately, and to acknowledge the importance of the exercise prescription.  Exercise Prescription Goal: Starting with aerobic activity 30 plus minutes a day, 3 days per week for initial exercise prescription. Provide home exercise prescription and guidelines that participant acknowledges understanding prior to discharge.  Activity Barriers & Risk Stratification:     Activity Barriers & Cardiac Risk Stratification - 09/21/15 0831    Activity Barriers & Cardiac Risk Stratification   Activity Barriers Deconditioning;Incisional Pain;Muscular Weakness;Other (comment)   Comments Pain in left heel for past few days with walking   Cardiac Risk Stratification High      6 Minute Walk:     6 Minute Walk      09/21/15 0820 12/06/15 1624     6 Minute Walk   Phase Initial Discharge    Distance 1698 feet 2081 feet    Distance % Change  22.56 %    Walk Time 6 minutes 6 minutes    # of Rest Breaks 0 0    MPH 3.22 3.94    METS 4.09 5.37    RPE 11 11    VO2 Peak 14.33 18.8    Symptoms Yes (comment) No    Comments c/p right heel pain     Resting HR 77 bpm 93 bpm    Resting BP 126/62 mmHg 140/70 mmHg    Max Ex. HR 92 bpm 130 bpm    Max Ex. BP 142/84 mmHg 170/92 mmHg    2 Minute Post BP 134/70 mmHg 162/80 mmHg       Initial Exercise Prescription:     Initial Exercise Prescription - 10/05/15 1400    Date of Initial Exercise RX and  Referring Provider   Date 09/21/15   Referring Provider Minus Breeding MD   Bike   Level 1.4  Minutes 10   METs 4   NuStep   Level 3   Minutes 10   METs 2   Track   Laps 10   Minutes 10   METs 2.76   Prescription Details   Frequency (times per week) 3   Intensity   THRR 40-80% of Max Heartrate 64-127   Ratings of Perceived Exertion 11-13   Progression   Progression Continue to progress workloads to maintain intensity without signs/symptoms of physical distress.   Resistance Training   Training Prescription Yes   Weight 2 lbs   Reps 10-12      Perform Capillary Blood Glucose checks as needed.  Exercise Prescription Changes:     Exercise Prescription Changes      10/04/15 1500 10/10/15 0800 10/24/15 1000 11/10/15 1445 11/24/15 1700   Exercise Review   Progression  Yes Yes Yes Yes   Response to Exercise   Blood Pressure (Admit)  124/70 mmHg 140/88 mmHg 158/86 mmHg 140/80 mmHg   Blood Pressure (Exercise)  138/70 mmHg 140/80 mmHg 164/84 mmHg 158/86 mmHg   Blood Pressure (Exit)  124/84 mmHg 120/70 mmHg 140/80 mmHg 134/82 mmHg   Heart Rate (Admit)  76 bpm 83 bpm 76 bpm 87 bpm   Heart Rate (Exercise)  112 bpm 111 bpm 120 bpm 124 bpm   Heart Rate (Exit)  72 bpm 77 bpm 76 bpm 82 bpm   Oxygen Saturation (Exercise)  100 %      Rating of Perceived Exertion (Exercise)  14 12 12 12    Comments Home Exercise Given 10/04/15 Home Exercise Given 10/04/15 Home Exercise Given 10/04/15 Home Exercise Given 10/04/15 Home Exercise Given 10/04/15   Duration  Progress to 30 minutes of continuous aerobic without signs/symptoms of physical distress Progress to 30 minutes of continuous aerobic without signs/symptoms of physical distress Progress to 30 minutes of continuous aerobic without signs/symptoms of physical distress Progress to 30 minutes of continuous aerobic without signs/symptoms of physical distress   Intensity  THRR unchanged THRR unchanged THRR unchanged THRR unchanged   Progression    Progression  Continue to progress workloads to maintain intensity without signs/symptoms of physical distress. Continue to progress workloads to maintain intensity without signs/symptoms of physical distress. Continue to progress workloads to maintain intensity without signs/symptoms of physical distress. Continue to progress workloads to maintain intensity without signs/symptoms of physical distress.   Average METs  3.5 3.8 3.9 4   Resistance Training   Training Prescription Yes Yes Yes Yes Yes   Weight 2 lbs 4lbs 4lbs 5lbs 5lbs   Reps 10-12 10-12 10-12 10-12 10-12   Interval Training   Interval Training  No No No No   Bike   Level 1.4 1.4 1.4 1.4 1.6   Minutes 10 10 10 10 10    METs 4 4.17 4.12 4.13 4.54   NuStep   Level 3 3 3 3 5    [place]    3.8 --   Minutes 10 20 10 10 10    METs 2 3.3 3.4 3.8 3.7   Track   Laps 10 --  No walking this time due to plantar fasciitis 16 16 16    Minutes 10  10 10 10    METs 2.76  3.76 3.79 3.79   Home Exercise Plan   Plans to continue exercise at [place]   Frequency Add 3 additional days to program exercise sessions. Add  3 additional days to program exercise sessions. Add 3 additional days to program exercise sessions. Add 3 additional days to program exercise sessions. Add 3 additional days to program exercise sessions.     12/08/15 0700           Exercise Review   Progression Yes       Response to Exercise   Blood Pressure (Admit) 140/70 mmHg       Blood Pressure (Exercise) 170/92 mmHg       Blood Pressure (Exit) 130/80 mmHg       Heart Rate (Admit) 93 bpm       Heart Rate (Exercise) 130 bpm       Heart Rate (Exit) 93 bpm       Rating of Perceived Exertion (Exercise) 12       Comments Home Exercise Given 10/04/15       Duration Progress to 30 minutes of continuous aerobic without signs/symptoms of physical distress       Intensity THRR unchanged        Progression   Progression Continue to progress workloads to maintain intensity without signs/symptoms of physical distress.       Average METs 4.2       Resistance Training   Training Prescription Yes       Weight 5lbs       Reps 10-12       Interval Training   Interval Training No       Bike   Level 1.6       Minutes 10       METs 4.62       NuStep   Level 5       Minutes 10       METs 4.4       Track   Laps 10.5  6MWT 2081 ft       Minutes 10       METs 5.37       Home Exercise Plan   Plans to continue exercise at Home       Frequency Add 3 additional days to program exercise sessions.          Exercise Comments:     Exercise Comments      10/10/15 0849 10/24/15 1052 11/13/15 0731 11/24/15 1716 11/29/15 1653   Exercise Comments Pt is tolerating exercise well.  He has not been able to walk for exercise recently due to a flare up on plantar fasciitis.  It is improving and he hopes to return to walking this week. Pt has returned to walking track for second station with no complaints.  He continues to do well. Pt doing well with exercise at cardiac rehab. Not consistently doing home exercise, but plans to exercise more regularly at home in addition to CR. Maintaining exercise workloads. Patient doing well, he plans to continue exercise by walking, riding stationary bike at home, and using pool at home, every other day for 30 minutes.     12/06/15 0742           Exercise Comments Patient is waling riding his outdoor bike, as well as using his stationary bike at home. Pt also plans to swim and overall has progressed well with eercise. Pt states he hasn't quite gotten his breath back, but feels balance, strength and flexibility are improved.          Discharge Exercise Prescription (Final Exercise Prescription Changes):     Exercise Prescription Changes - 12/08/15 0700  Exercise Review   Progression Yes   Response to Exercise   Blood Pressure (Admit) 140/70 mmHg    Blood Pressure (Exercise) 170/92 mmHg   Blood Pressure (Exit) 130/80 mmHg   Heart Rate (Admit) 93 bpm   Heart Rate (Exercise) 130 bpm   Heart Rate (Exit) 93 bpm   Rating of Perceived Exertion (Exercise) 12   Comments Home Exercise Given 10/04/15   Duration Progress to 30 minutes of continuous aerobic without signs/symptoms of physical distress   Intensity THRR unchanged   Progression   Progression Continue to progress workloads to maintain intensity without signs/symptoms of physical distress.   Average METs 4.2   Resistance Training   Training Prescription Yes   Weight 5lbs   Reps 10-12   Interval Training   Interval Training No   Bike   Level 1.6   Minutes 10   METs 4.62   NuStep   Level 5   Minutes 10   METs 4.4   Track   Laps 10.5  6MWT 2081 ft   Minutes 10   METs 5.37   Home Exercise Plan   Plans to continue exercise at Home   Frequency Add 3 additional days to program exercise sessions.      Nutrition:  Target Goals: Understanding of nutrition guidelines, daily intake of sodium 1500mg , cholesterol 200mg , calories 30% from fat and 7% or less from saturated fats, daily to have 5 or more servings of fruits and vegetables.  Biometrics:     Pre Biometrics - 09/21/15 1021    Pre Biometrics   Height 5' 8.25" (1.734 m)   Weight 183 lb 6.8 oz (83.2 kg)   Waist Circumference 37 inches   Hip Circumference 37 inches   Waist to Hip Ratio 1 %   BMI (Calculated) 27.7   Triceps Skinfold 12 mm   % Body Fat 25 %   Grip Strength 42 kg   Flexibility 8 in   Single Leg Stand 3.51 seconds       Nutrition Therapy Plan and Nutrition Goals:     Nutrition Therapy & Goals - 09/21/15 1432    Nutrition Therapy   Diet Therapeutic Lifestyle Changes   Personal Nutrition Goals   Personal Goal #1 Maintain current wt while in [place], educate and counsel regarding individualized specific dietary modifications aiming towards  targeted core components such as weight, hypertension, lipid management, diabetes, heart failure and other comorbidities.   Expected Outcomes Short Term Goal: Understand basic principles of dietary content, such as calories, fat, sodium, cholesterol and nutrients.;Long Term Goal: Adherence to prescribed nutrition plan.      Nutrition Discharge: Nutrition Scores:     Nutrition Assessments - 10/16/15 1553    MEDFICTS Scores   Pre Score 36      Nutrition Goals Re-Evaluation:   Psychosocial: Target Goals: Acknowledge presence or absence of depression, maximize coping skills, provide positive support system. Participant is able to verbalize types and ability to use techniques and skills needed for reducing stress and depression.  Initial Review & Psychosocial Screening:     Initial Psych Review & Screening - 09/21/15 [place]? Yes   Barriers   Psychosocial barriers to participate in program There are no identifiable barriers or psychosocial needs.   Screening Interventions   Interventions Encouraged to exercise      Quality of Life Scores:     Quality of  Life - 11/29/15 1659    Quality of Life Scores   Health/Function Pre 21.2 %   Health/Function Post 24 %   Health/Function % Change 13.21 %   Socioeconomic Pre 21.94 %   Socioeconomic Post 23.14 %   Socioeconomic % Change  5.47 %   Psych/Spiritual Pre 22.5 %   Psych/Spiritual Post 21.67 %   Psych/Spiritual % Change -3.69 %   Family Pre 20.13 %   Family Post 22.5 %   Family % Change 11.77 %   GLOBAL Pre 21.45 %   GLOBAL Post 23.19 %   GLOBAL % Change 8.11 %      PHQ-9:     Recent Review Flowsheet Data    Depression screen [facility] 2/9 09/25/2015 08/29/2015 10/14/2011   Decreased Interest 0 0 0   Down, Depressed, Hopeless 0 0 0   PHQ - 2 Score 0 0 0      Psychosocial Evaluation and Intervention:   Psychosocial Re-Evaluation:     Psychosocial Re-Evaluation      10/19/15 1233  11/16/15 1344 12/14/15 1824       Psychosocial Re-Evaluation   Interventions Encouraged to attend Cardiac Rehabilitation for the exercise Encouraged to attend Cardiac Rehabilitation for the exercise Encouraged to attend Cardiac Rehabilitation for the exercise     Continued Psychosocial Services Needed No No No        Vocational Rehabilitation: Provide vocational rehab assistance to qualifying candidates.   Vocational Rehab Evaluation & Intervention:     Vocational Rehab - 09/21/15 1545    Initial Vocational Rehab Evaluation & Intervention   Assessment shows need for Vocational Rehabilitation No      Education: Education Goals: Education classes will be provided on a weekly basis, covering required topics. Participant will state understanding/return demonstration of topics presented.  Learning Barriers/Preferences:   Education Topics: Count Your Pulse:  -Group instruction provided by verbal instruction, demonstration, patient participation and written materials to support subject.  Instructors address importance of being able to find your pulse and how to count your pulse when at home without a heart monitor.  Patients get hands on experience counting their pulse with staff help and individually.          CARDIAC REHAB PHASE II EXERCISE from 12/13/2015 in [place]   Date  11/24/15   Educator  Maurice Small, RN   Instruction Review Code  R- Review/reinforce [Second Class for practice]      Heart Attack, Angina, and Risk Factor Modification:  -Group instruction provided by verbal instruction, video, and written materials to support subject.  Instructors address signs and symptoms of angina and heart attacks.    Also discuss risk factors for heart disease and how to make changes to improve heart health risk factors.   Functional Fitness:  -Group instruction provided by verbal instruction, demonstration, patient participation, and written  materials to support subject.  Instructors address safety measures for doing things around the house.  Discuss how to get up and down off the floor, how to pick things up properly, how to safely get out of a chair without assistance, and balance training.      CARDIAC REHAB PHASE II EXERCISE from 12/13/2015 in [place]   Date  11/10/15   Educator  Seward Carol   Instruction Review Code  2- meets goals/outcomes      Meditation and Mindfulness:  -Group instruction provided by verbal instruction, patient participation, and written materials  to support subject.  Instructor addresses importance of mindfulness and meditation practice to help reduce stress and improve awareness.  Instructor also leads participants through a meditation exercise.       CARDIAC REHAB PHASE II EXERCISE from 12/13/2015 in [place]   Date  11/15/15   Educator  Jeanella Craze   Instruction Review Code  2- meets goals/outcomes      Stretching for Flexibility and Mobility:  -Group instruction provided by verbal instruction, patient participation, and written materials to support subject.  Instructors lead participants through series of stretches that are designed to increase flexibility thus improving mobility.  These stretches are additional exercise for major muscle groups that are typically performed during regular warm up and cool down.      CARDIAC REHAB PHASE II EXERCISE from 12/13/2015 in [place]   Date  12/13/15   Educator  Luetta Nutting Iline Oven   Instruction Review Code  2- meets goals/outcomes      Hands Only CPR Anytime:  -Group instruction provided by verbal instruction, video, patient participation and written materials to support subject.  Instructors co-teach with AHA video for hands only CPR.  Participants get hands on experience with mannequins.      CARDIAC REHAB PHASE II EXERCISE from 12/13/2015 in  [place]   Date  12/08/15   Instruction Review Code  2- meets goals/outcomes      Nutrition I class: Heart Healthy Eating:  -Group instruction provided by PowerPoint slides, verbal discussion, and written materials to support subject matter. The instructor gives an explanation and review of the Therapeutic Lifestyle Changes diet recommendations, which includes a discussion on lipid goals, dietary fat, sodium, fiber, plant stanol/sterol esters, sugar, and the components of a well-balanced, healthy diet.   Nutrition II class: Lifestyle Skills:  -Group instruction provided by PowerPoint slides, verbal discussion, and written materials to support subject matter. The instructor gives an explanation and review of label reading, grocery shopping for heart health, heart healthy recipe modifications, and ways to make healthier choices when eating out.      CARDIAC REHAB PHASE II EXERCISE from 12/13/2015 in [place]   Date  10/16/15   Educator  RD   Instruction Review Code  Not applicable [handouts given]      Diabetes Question & Answer:  -Group instruction provided by PowerPoint slides, verbal discussion, and written materials to support subject matter. The instructor gives an explanation and review of diabetes co-morbidities, pre- and post-prandial blood glucose goals, pre-exercise blood glucose goals, signs, symptoms, and treatment of hypoglycemia and hyperglycemia, and foot care basics.   Diabetes Blitz:  -Group instruction provided by PowerPoint slides, verbal discussion, and written materials to support subject matter. The instructor gives an explanation and review of the physiology behind type 1 and type 2 diabetes, diabetes medications and rational behind using different medications, pre- and post-prandial blood glucose recommendations and Hemoglobin A1c goals, diabetes diet, and exercise including blood glucose guidelines for  exercising safely.    Portion Distortion:  -Group instruction provided by PowerPoint slides, verbal discussion, written materials, and food models to support subject matter. The instructor gives an explanation of serving size versus portion size, changes in portions sizes over the last 20 years, and what consists of a serving from each food group.      CARDIAC REHAB PHASE II EXERCISE from 12/13/2015 in [place]  Date  10/18/15   Educator  RD   Instruction Review Code  2- meets goals/outcomes      Stress Management:  -Group instruction provided by verbal instruction, video, and written materials to support subject matter.  Instructors review role of stress in heart disease and how to cope with stress positively.        CARDIAC REHAB PHASE II EXERCISE from 12/13/2015 in [place]   Date  10/11/15   Instruction Review Code  2- meets goals/outcomes      Exercising on Your Own:  -Group instruction provided by verbal instruction, power point, and written materials to support subject.  Instructors discuss benefits of exercise, components of exercise, frequency and intensity of exercise, and end points for exercise.  Also discuss use of nitroglycerin and activating EMS.  Review options of places to exercise outside of rehab.  Review guidelines for sex with heart disease.      CARDIAC REHAB PHASE II EXERCISE from 12/13/2015 in [place]   Date  09/27/15   Instruction Review Code  2- meets goals/outcomes      Cardiac Drugs I:  -Group instruction provided by verbal instruction and written materials to support subject.  Instructor reviews cardiac drug classes: antiplatelets, anticoagulants, beta blockers, and statins.  Instructor discusses reasons, side effects, and lifestyle considerations for each drug class.      CARDIAC REHAB PHASE II EXERCISE from 12/13/2015 in [place]   Date  10/04/15   Educator  pharm D   Instruction Review Code  2- meets goals/outcomes      Cardiac Drugs II:  -Group instruction provided by verbal instruction and written materials to support subject.  Instructor reviews cardiac drug classes: angiotensin converting enzyme inhibitors (ACE-I), angiotensin II receptor blockers (ARBs), nitrates, and calcium channel blockers.  Instructor discusses reasons, side effects, and lifestyle considerations for each drug class.      CARDIAC REHAB PHASE II EXERCISE from 12/13/2015 in [place]   Date  11/01/15   Instruction Review Code  2- meets goals/outcomes      Anatomy and Physiology of the Circulatory System:  -Group instruction provided by verbal instruction, video, and written materials to support subject.  Reviews functional anatomy of heart, how it relates to various diagnoses, and what role the heart plays in the overall system.      CARDIAC REHAB PHASE II EXERCISE from 12/13/2015 in [place]   Date  11/08/15   Instruction Review Code  2- meets goals/outcomes      Knowledge Questionnaire Score:     Knowledge Questionnaire Score - 11/29/15 1657    Knowledge Questionnaire Score   Pre Score 21/24   Post Score 21/24      Core Components/Risk Factors/Patient Goals at Admission:     Personal Goals and Risk Factors at Admission - 09/21/15 0832    Core Components/Risk Factors/Patient Goals on Admission    Weight Management Yes   Intervention Weight Management: Develop a combined nutrition and exercise program designed to reach desired caloric intake, while maintaining appropriate intake of nutrient and fiber, sodium and fats, and appropriate energy expenditure required for the weight goal.;Weight Management/Obesity: Establish reasonable short term and long term weight goals.   Admit Weight 183 lb 6.8 oz (83.2 kg)   Goal Weight: Short Term 180 lb (81.647 kg)   Goal  Weight: Long Term 173 lb (78.472  kg)   Expected Outcomes Short Term: Continue to assess and modify interventions until short term weight is achieved;Long Term: Adherence to nutrition and physical activity/exercise program aimed toward attainment of established weight goal;Weight Loss: Understanding of general recommendations for a balanced deficit meal plan, which promotes 1-2 lb weight loss per week and includes a negative energy balance of [phone] kcal/d;Understanding recommendations for meals to include 15-35% energy as protein, 25-35% energy from fat, 35-60% energy from carbohydrates, less than 200mg  of dietary cholesterol, 20-35 gm of total fiber daily;Understanding of distribution of calorie intake throughout the day with the consumption of 4-5 meals/snacks   Increase Strength and Stamina Yes  More energy, Better walking   Intervention Provide advice, education, support and counseling about physical activity/exercise needs.;Develop an individualized exercise prescription for aerobic and resistive training based on initial evaluation findings, risk stratification, comorbidities and participant's personal goals.   Expected Outcomes Achievement of increased cardiorespiratory fitness and enhanced flexibility, muscular endurance and strength shown through measurements of functional capacity and personal statement of participant.   Hypertension Yes   Intervention Provide education on lifestyle modifcations including regular physical activity/exercise, weight management, moderate sodium restriction and increased consumption of fresh fruit, vegetables, and low fat dairy, alcohol moderation, and smoking cessation.;Monitor prescription use compliance.   Expected Outcomes Short Term: Continued assessment and intervention until BP is < 140/27mm HG in hypertensive participants. < 130/20mm HG in hypertensive participants with diabetes, heart failure or chronic kidney disease.;Long Term: Maintenance of blood  pressure at goal levels.   Lipids Yes   Intervention Provide education and support for participant on nutrition & aerobic/resistive exercise along with prescribed medications to achieve LDL 70mg , HDL >40mg .   Expected Outcomes Short Term: Participant states understanding of desired cholesterol values and is compliant with medications prescribed. Participant is following exercise prescription and nutrition guidelines.;Long Term: Cholesterol controlled with medications as prescribed, with individualized exercise RX and with personalized nutrition plan. Value goals: LDL < 70mg , HDL > 40 mg.   Stress Yes   Intervention Offer individual and/or small group education and counseling on adjustment to heart disease, stress management and health-related lifestyle change. Teach and support self-help strategies.   Expected Outcomes Short Term: Participant demonstrates changes in health-related behavior, relaxation and other stress management skills, ability to obtain effective social support, and compliance with psychotropic medications if prescribed.;Long Term: Emotional wellbeing is indicated by absence of clinically significant psychosocial distress or social isolation.   Personal Goal Other Yes   Personal Goal Better walking, less pain   Intervention Provide exercise prescription to increase functional capacity and stregth   Expected Outcomes Increase functional capacity      Core Components/Risk Factors/Patient Goals Review:      Goals and Risk Factor Review      10/11/15 1542 12/08/15 0800         Core Components/Risk Factors/Patient Goals Review   Personal Goals Review Weight Management/Obesity;Sedentary;Increase Strength and Stamina;Hypertension;Stress;Other Weight Management/Obesity;Increase Strength and Stamina;Improve shortness of breath with ADL's;Other      Review Weight is up.  Energy is up and down depending on day.  Pain in foot improved, but back flaring up.  He is walking better.  Blood  pressures have been stable.  Attended Stress class today. Weight is down 1.8  lbs, close to goal weight, currently 183.5 lbs. Walking better, strength, stamina, flexibilty, balance better. Still has SOB with activity.      Expected Outcomes Lose weight, improved energy levels by walking at home more. Maintain current  exercise regimin. Pt riding bike QOD, walking the days he doesn't ride. Pt also using stationary bike and plans to use home pool for swimming.         Core Components/Risk Factors/Patient Goals at Discharge (Final Review):      Goals and Risk Factor Review - 12/08/15 0800    Core Components/Risk Factors/Patient Goals Review   Personal Goals Review Weight Management/Obesity;Increase Strength and Stamina;Improve shortness of breath with ADL's;Other   Review Weight is down 1.8  lbs, close to goal weight, currently 183.5 lbs. Walking better, strength, stamina, flexibilty, balance better. Still has SOB with activity.   Expected Outcomes Maintain current exercise regimin. Pt riding bike QOD, walking the days he doesn't ride. Pt also using stationary bike and plans to use home pool for swimming.      ITP Comments:     ITP Comments      09/21/15 0945           ITP Comments Medical Director-Dr. Fransico Him, MD          Comments: Pt is making expected progress toward personal goals after completing 33 sessions. Recommend continued exercise and life style modification education including  stress management and relaxation techniques to decrease cardiac risk profile. Abe People will graduate next week.Barnet Pall, RN,BSN 12/14/2015 6:25 PM

## 2015-12-15 ENCOUNTER — Encounter (HOSPITAL_COMMUNITY): Payer: BLUE CROSS/BLUE SHIELD

## 2015-12-15 ENCOUNTER — Encounter (HOSPITAL_COMMUNITY)
Admission: RE | Admit: 2015-12-15 | Discharge: 2015-12-15 | Disposition: A | Discharge status: Home / Self Care | Payer: BLUE CROSS/BLUE SHIELD | Source: Ambulatory Visit | Attending: Cardiology | Admitting: Cardiology

## 2015-12-15 DIAGNOSIS — Z951 Presence of aortocoronary bypass graft: Secondary | ICD-10-CM

## 2015-12-18 ENCOUNTER — Encounter (HOSPITAL_COMMUNITY)
Admission: RE | Admit: 2015-12-18 | Discharge: 2015-12-18 | Disposition: A | Discharge status: Home / Self Care | Payer: BLUE CROSS/BLUE SHIELD | Source: Ambulatory Visit | Attending: Cardiology | Admitting: Cardiology

## 2015-12-18 DIAGNOSIS — Z951 Presence of aortocoronary bypass graft: Secondary | ICD-10-CM

## 2015-12-20 ENCOUNTER — Encounter (HOSPITAL_COMMUNITY)
Admission: RE | Admit: 2015-12-20 | Discharge: 2015-12-20 | Disposition: A | Discharge status: Home / Self Care | Payer: BLUE CROSS/BLUE SHIELD | Source: Ambulatory Visit | Attending: Cardiology | Admitting: Cardiology

## 2015-12-20 VITALS — BP 150/84 | HR 83 | Ht 68.25 in | Wt 182.8 lb

## 2015-12-20 DIAGNOSIS — Z951 Presence of aortocoronary bypass graft: Secondary | ICD-10-CM | POA: Diagnosis not present

## 2015-12-20 NOTE — Progress Notes (Signed)
Discharge Summary  Patient Details  Name: Manuel Bell MRN: [id number] Date of Birth: 01/24/1956 Referring Provider:            CARDIAC REHAB PHASE II EXERCISE from 10/04/2015 in [facility]   Referring Provider  Minus Breeding MD       Number of Visits: 36  Reason for Discharge:  Patient independent in their exercise.  Smoking History:  History  Smoking status  . Former Smoker -- 1.00 packs/day for 30 years  . Types: Cigarettes  . Quit date: 09/10/2003  Smokeless tobacco  . Not on file    Comment: smoked 1971- 2005 (off intermittently 1.5 years), up to 1 ppd     Diagnosis:  S/P CABG x 2  ADL [facility]:   Initial Exercise Prescription:     Initial Exercise Prescription - 10/05/15 1400    Date of Initial Exercise RX and Referring Provider   Date 09/21/15   Referring Provider Minus Breeding MD   Bike   Level 1.4   Minutes 10   METs 4   NuStep   Level 3   Minutes 10   METs 2   Track   Laps 10   Minutes 10   METs 2.76   Prescription Details   Frequency (times per week) 3   Intensity   THRR 40-80% of Max Heartrate 64-127   Ratings of Perceived Exertion 11-13   Progression   Progression Continue to progress workloads to maintain intensity without signs/symptoms of physical distress.   Resistance Training   Training Prescription Yes   Weight 2 lbs   Reps 10-12      Discharge Exercise Prescription (Final Exercise Prescription Changes):     Exercise Prescription Changes - 12/20/15 1600    Exercise Review   Progression Yes   Response to Exercise   Blood Pressure (Admit) 150/84 mmHg   Blood Pressure (Exercise) 150/90 mmHg   Blood Pressure (Exit) 152/80 mmHg   Heart Rate (Admit) 83 bpm   Heart Rate (Exercise) 121 bpm   Heart Rate (Exit) 83 bpm   Rating of Perceived Exertion (Exercise) 12   Symptoms none   Comments Home Exercise Given 10/04/15   Duration Progress to 30 minutes of continuous aerobic without  signs/symptoms of physical distress   Intensity THRR unchanged   Progression   Progression Continue to progress workloads to maintain intensity without signs/symptoms of physical distress.   Average METs 4.1   Resistance Training   Training Prescription Yes   Weight 5lbs   Reps 10-12   Interval Training   Interval Training No   Bike   Level 1.6   Minutes 10   METs 4.62   NuStep   Level 5   Minutes 10   METs 3.9   Track   Laps 16  6MWT 2081 ft   Minutes 10   METs 3.78   Home Exercise Plan   Plans to continue exercise at Home   Frequency Add 3 additional days to program exercise sessions.      Functional Capacity:     6 Minute Walk      09/21/15 0820 12/06/15 1624     6 Minute Walk   Phase Initial Discharge    Distance 1698 feet 2081 feet    Distance % Change  22.56 %    Walk Time 6 minutes 6 minutes    # of Rest Breaks 0 0    MPH 3.22 3.94  METS 4.09 5.37    RPE 11 11    VO2 Peak 14.33 18.8    Symptoms Yes (comment) No    Comments c/p right heel pain     Resting HR 77 bpm 93 bpm    Resting BP 126/62 mmHg 140/70 mmHg    Max Ex. HR 92 bpm 130 bpm    Max Ex. BP 142/84 mmHg 170/92 mmHg    2 Minute Post BP 134/70 mmHg 162/80 mmHg       Psychological, QOL, Others - Outcomes: PHQ 2/9: Depression screen [facility] 2/9 12/21/2015 09/25/2015 08/29/2015 10/14/2011  Decreased Interest 0 0 0 0  Down, Depressed, Hopeless 0 0 0 0  PHQ - 2 Score 0 0 0 0    Quality of Life:     Quality of Life - 11/29/15 1659    Quality of Life Scores   Health/Function Pre 21.2 %   Health/Function Post 24 %   Health/Function % Change 13.21 %   Socioeconomic Pre 21.94 %   Socioeconomic Post 23.14 %   Socioeconomic % Change  5.47 %   Psych/Spiritual Pre 22.5 %   Psych/Spiritual Post 21.67 %   Psych/Spiritual % Change -3.69 %   Family Pre 20.13 %   Family Post 22.5 %   Family % Change 11.77 %   GLOBAL Pre 21.45 %   GLOBAL Post 23.19 %   GLOBAL % Change 8.11 %      Personal  Goals: Goals established at orientation with interventions provided to work toward goal.     Personal Goals and Risk Factors at Admission - 09/21/15 0832    Core Components/Risk Factors/Patient Goals on Admission    Weight Management Yes   Intervention Weight Management: Develop a combined nutrition and exercise program designed to reach desired caloric intake, while maintaining appropriate intake of nutrient and fiber, sodium and fats, and appropriate energy expenditure required for the weight goal.;Weight Management/Obesity: Establish reasonable short term and long term weight goals.   Admit Weight 183 lb 6.8 oz (83.2 kg)   Goal Weight: Short Term 180 lb (81.647 kg)   Goal Weight: Long Term 173 lb (78.472 kg)   Expected Outcomes Short Term: Continue to assess and modify interventions until short term weight is achieved;Long Term: Adherence to nutrition and physical activity/exercise program aimed toward attainment of established weight goal;Weight Loss: Understanding of general recommendations for a balanced deficit meal plan, which promotes 1-2 lb weight loss per week and includes a negative energy balance of [phone] kcal/d;Understanding recommendations for meals to include 15-35% energy as protein, 25-35% energy from fat, 35-60% energy from carbohydrates, less than 275m of dietary cholesterol, 20-35 gm of total fiber daily;Understanding of distribution of calorie intake throughout the day with the consumption of 4-5 meals/snacks   Increase Strength and Stamina Yes  More energy, Better walking   Intervention Provide advice, education, support and counseling about physical activity/exercise needs.;Develop an individualized exercise prescription for aerobic and resistive training based on initial evaluation findings, risk stratification, comorbidities and participant's personal goals.   Expected Outcomes Achievement of increased cardiorespiratory fitness and enhanced flexibility, muscular endurance  and strength shown through measurements of functional capacity and personal statement of participant.   Hypertension Yes   Intervention Provide education on lifestyle modifcations including regular physical activity/exercise, weight management, moderate sodium restriction and increased consumption of fresh fruit, vegetables, and low fat dairy, alcohol moderation, and smoking cessation.;Monitor prescription use compliance.   Expected Outcomes Short Term: Continued assessment and intervention until  BP is < 140/43m HG in hypertensive participants. < 130/885mHG in hypertensive participants with diabetes, heart failure or chronic kidney disease.;Long Term: Maintenance of blood pressure at goal levels.   Lipids Yes   Intervention Provide education and support for participant on nutrition & aerobic/resistive exercise along with prescribed medications to achieve LDL <7094mHDL >38m41m Expected Outcomes Short Term: Participant states understanding of desired cholesterol values and is compliant with medications prescribed. Participant is following exercise prescription and nutrition guidelines.;Long Term: Cholesterol controlled with medications as prescribed, with individualized exercise RX and with personalized nutrition plan. Value goals: LDL < 70mg17mL > 40 mg.   Stress Yes   Intervention Offer individual and/or small group education and counseling on adjustment to heart disease, stress management and health-related lifestyle change. Teach and support self-help strategies.   Expected Outcomes Short Term: Participant demonstrates changes in health-related behavior, relaxation and other stress management skills, ability to obtain effective social support, and compliance with psychotropic medications if prescribed.;Long Term: Emotional wellbeing is indicated by absence of clinically significant psychosocial distress or social isolation.   Personal Goal Other Yes   Personal Goal Better walking, less pain    Intervention Provide exercise prescription to increase functional capacity and stregth   Expected Outcomes Increase functional capacity       Personal Goals Discharge:     Goals and Risk Factor Review      10/11/15 1542 12/08/15 0800         Core Components/Risk Factors/Patient Goals Review   Personal Goals Review Weight Management/Obesity;Sedentary;Increase Strength and Stamina;Hypertension;Stress;Other Weight Management/Obesity;Increase Strength and Stamina;Improve shortness of breath with ADL's;Other      Review Weight is up.  Energy is up and down depending on day.  Pain in foot improved, but back flaring up.  He is walking better.  Blood pressures have been stable.  Attended Stress class today. Weight is down 1.8  lbs, close to goal weight, currently 183.5 lbs. Walking better, strength, stamina, flexibilty, balance better. Still has SOB with activity.      Expected Outcomes Lose weight, improved energy levels by walking at home more. Maintain current exercise regimin. Pt riding bike QOD, walking the days he doesn't ride. Pt also using stationary bike and plans to use home pool for swimming.         Nutrition & Weight - Outcomes:     Pre Biometrics - 09/21/15 1021    Pre Biometrics   Height 5' 8.25" (1.734 m)   Weight 183 lb 6.8 oz (83.2 kg)   Waist Circumference 37 inches   Hip Circumference 37 inches   Waist to Hip Ratio 1 %   BMI (Calculated) 27.7   Triceps Skinfold 12 mm   % Body Fat 25 %   Grip Strength 42 kg   Flexibility 8 in   Single Leg Stand 3.51 seconds         Post Biometrics - 12/20/15 1638     Post  Biometrics   Height 5' 8.25" (1.734 m)   Weight 182 lb 12.2 oz (82.9 kg)   Waist Circumference 38.75 inches   Hip Circumference 41 inches   Waist to Hip Ratio 0.95 %   BMI (Calculated) 27.6   Triceps Skinfold 18 mm   % Body Fat 27.4 %   Grip Strength 51.5 kg   Flexibility 13 in   Single Leg Stand 30 seconds      Nutrition:     Nutrition Therapy  & Goals -  09/21/15 1432    Nutrition Therapy   Diet Therapeutic Lifestyle Changes   Personal Nutrition Goals   Personal Goal #1 Maintain current wt while in [place], educate and counsel regarding individualized specific dietary modifications aiming towards targeted core components such as weight, hypertension, lipid management, diabetes, heart failure and other comorbidities.   Expected Outcomes Short Term Goal: Understand basic principles of dietary content, such as calories, fat, sodium, cholesterol and nutrients.;Long Term Goal: Adherence to prescribed nutrition plan.      Nutrition Discharge:     Nutrition Assessments - 10/16/15 1553    MEDFICTS Scores   Pre Score 36      Education Questionnaire Score:     Knowledge Questionnaire Score - 11/29/15 1657    Knowledge Questionnaire Score   Pre Score 21/24   Post Score 21/24      Goals reviewed with patient; copy given to patient. Pt graduated from cardiac rehab program today with completion of 36 exercise sessions in Phase II. Pt maintained good attendance and progressed nicely during his participation in rehab as evidenced by increased MET level.   Medication list reconciled. Repeat  PHQ score-0  .  Pt has made significant lifestyle changes and should be commended for his success. Pt feels he has achieved his goals during cardiac rehab.   Pt plans to continue exercise by walking and using his bike at home. Abe People continued to have intermittent resting and exertional blood pressure elevations during exercise. Will fax exercise flow sheets to Dr. Ovidio Kin office for review.Barnet Pall, RN,BSN 01/02/2016 9:59 AM

## 2015-12-22 ENCOUNTER — Encounter (HOSPITAL_COMMUNITY): Payer: BLUE CROSS/BLUE SHIELD

## 2015-12-25 ENCOUNTER — Encounter (HOSPITAL_COMMUNITY): Payer: BLUE CROSS/BLUE SHIELD

## 2015-12-27 ENCOUNTER — Encounter (HOSPITAL_COMMUNITY): Payer: BLUE CROSS/BLUE SHIELD

## 2015-12-28 ENCOUNTER — Encounter: Payer: Self-pay | Admitting: Cardiology

## 2015-12-29 ENCOUNTER — Encounter (HOSPITAL_COMMUNITY): Payer: BLUE CROSS/BLUE SHIELD

## 2016-01-08 ENCOUNTER — Other Ambulatory Visit: Payer: Self-pay | Admitting: Cardiology

## 2016-01-10 ENCOUNTER — Other Ambulatory Visit: Payer: Self-pay | Admitting: Cardiology

## 2016-01-10 NOTE — Telephone Encounter (Signed)
ambien refilled refused - defer to PCP

## 2016-01-19 ENCOUNTER — Telehealth: Payer: Self-pay | Admitting: Cardiology

## 2016-01-19 MED ORDER — LISINOPRIL 5 MG PO TABS: 5.0000 mg | ORAL_TABLET | Freq: Two times a day (BID) | ORAL | 3 refills | Status: DC

## 2016-01-19 NOTE — Telephone Encounter (Signed)
Returned call to patient. He notes he had not taken his BP for about 1 month, restarted w BP checks this week and noted it runs higher in AM before his medications. States pre-medication BPs around 150/100. When he rechecks later in the day, he gets pressures of about 130/80. No symptoms or other concerns.  He is on meds as listed - states he used to take his lisinopril BID, but this was changed. Advised for now to just continue to check BPs BID, will seek advice on meds from pharmD.   Informed him we have options including BP clinic if we need to bring him in for dose changes/cuff calibrations, etc.  He also had questions regarding exercise which I will defer to Cardiac Rehab.

## 2016-01-19 NOTE — Telephone Encounter (Signed)
Pt have having problems with his blood pressure being high all week. He would like to be seen today if possible please. Bp have been as high as 155/103.

## 2016-01-19 NOTE — Telephone Encounter (Signed)
Have him increase lisinopril from 5 mg qd to 5 mg bid.  Continue with BP monitoring and check back in 7-10 days if no improvement in morning readings.

## 2016-01-19 NOTE — Telephone Encounter (Signed)
Returned call to patient and made recommendations. Med list updated to reflect most recent changes. He is aware to call if BP issues do not improve or if he has other concerns.

## 2016-02-19 ENCOUNTER — Other Ambulatory Visit: Payer: Self-pay | Admitting: *Deleted

## 2016-02-19 MED ORDER — LISINOPRIL 5 MG PO TABS: 5.0000 mg | ORAL_TABLET | Freq: Two times a day (BID) | ORAL | 3 refills | Status: DC

## 2016-03-08 ENCOUNTER — Other Ambulatory Visit: Payer: Self-pay

## 2016-03-08 MED ORDER — PRAVASTATIN SODIUM 40 MG PO TABS: 40.0000 mg | ORAL_TABLET | Freq: Every evening | ORAL | 0 refills | Status: DC

## 2016-03-08 MED ORDER — LISINOPRIL 5 MG PO TABS: 5.0000 mg | ORAL_TABLET | Freq: Two times a day (BID) | ORAL | 0 refills | Status: DC

## 2016-03-08 MED ORDER — LANSOPRAZOLE 30 MG PO CPDR: DELAYED_RELEASE_CAPSULE | ORAL | 1 refills | Status: DC

## 2016-03-08 MED ORDER — METOPROLOL TARTRATE 25 MG PO TABS: 25.0000 mg | ORAL_TABLET | Freq: Two times a day (BID) | ORAL | 0 refills | Status: DC

## 2016-04-03 ENCOUNTER — Encounter: Payer: Self-pay | Admitting: Cardiology

## 2016-04-09 ENCOUNTER — Other Ambulatory Visit: Payer: Self-pay | Admitting: Cardiology

## 2016-04-09 MED ORDER — ZOLPIDEM TARTRATE 10 MG PO TABS: 10.0000 mg | ORAL_TABLET | Freq: Every day | ORAL | 0 refills | Status: DC

## 2016-04-09 MED ORDER — ALPRAZOLAM 0.5 MG PO TABS: 0.2500 mg | ORAL_TABLET | Freq: Every evening | ORAL | 0 refills | Status: DC | PRN

## 2016-04-09 NOTE — Telephone Encounter (Signed)
Do you wish to refill pt Ambien and Alprazolam

## 2016-04-10 ENCOUNTER — Encounter: Payer: Self-pay | Admitting: Cardiology

## 2016-04-15 NOTE — Telephone Encounter (Signed)
-----   Message from Minus Breeding, MD sent at 04/13/2016 11:55 AM EDT ----- This patient wants a refill on Ambien and Xanax.  I will prescribe one month of the ambien and renew the Xanax for 15 pills but he needs to start to get these from a PCP.  Please document this in the chart.

## 2016-04-15 NOTE — Telephone Encounter (Signed)
Ambien #30 0 refills and Xanax 0.5 mg # 15 0 refills was send into [facility] pharmacy, pt is made aware that he will need to establish himself with a PCP because Dr Percival Spanish will no longer refill these medication, pt voice understanding.Marland Kitchen

## 2016-04-15 NOTE — Progress Notes (Signed)
Cardiology Office Note   Date:  04/16/2016   ID:  Manuel Bell, DOB 1956/05/25, MRN [id number]  PCP:  No PCP Per Patient  Cardiologist:   Minus Breeding, MD   No chief complaint on file.     History of Present Illness: Manuel Bell is a 60 y.o. male who presents for follow-up after redo CABG. He presented in January with dyspnea and subsequently had cath demonstrating 2 of 5 bypass grafts patent. He underwent redo CABG with a left radial artery to ramus intermediate and SVG to posterior lateral. He did have some postoperative atrial fibrillation. He was on amiodarone for a while but now is been off of this. Since I last saw him he has had his lisinopril increased secondary to poorly controlled BPs.   He reports his blood pressure still running in the 160s although he's not taking it very frequently. He's been limited by some back and foot pain but he starting to get over this. He is somewhat active but not exercising because of this. The patient denies any new symptoms such as chest discomfort, neck or arm discomfort. There has been no new shortness of breath, PND or orthopnea. There have been no reported palpitations, presyncope or syncope.  Past Medical History:  Diagnosis Date  . Alcohol abuse   . Arthritis   . Asthma    as a child   . CAD (coronary artery disease)    LHC 09/11/11: 3 vessel CAD, EF 45-50%  . CARRIER, VIRAL HEPATITIS C 01/28/2007   Qualifier: Diagnosis of  By: Linna Darner MD, Gwyndolyn Saxon    . Complication of anesthesia    slow to awaken  . Coronary atherosclerosis of native coronary artery 10/02/2011  . Dysrhythmia    palpations at times  . ELEVATION, TRANSAMINASE/LDH LEVELS 02/06/2007   Qualifier: Diagnosis of  By: Janelle Floor    . Family history of anesthesia complication   . GERD 10/19/2008   Qualifier: Diagnosis of  By: Linna Darner MD, Gwyndolyn Saxon    . GERD (gastroesophageal reflux disease)   . Hepatitis C    s/p interferon/[facility]; chronically elevated LFTs.  treated 2015- 2016- Completed 07/2014  . Hx of CABG 08/2011   Dr. Prescott Gum - LIMA to LAD, SVG-D1, SVG-distal RCA, SVG-RI/distal circumflex  . Hyperlipidemia   . HYPERLIPIDEMIA 11/22/2009   Qualifier: Diagnosis of  By: Linna Darner MD, Gwyndolyn Saxon   Pravastatin 40 mg started post CBAG 08/2011      . HYPERTENSION, ESSENTIAL NOS 08/20/2007   Qualifier: Diagnosis of  By: Linna Darner MD, Gwyndolyn Saxon     . LUMBAR RADICULOPATHY, RIGHT 01/02/2010   Qualifier: Diagnosis of  By: Linna Darner MD, [place] 11/22/2009   Qualifier: Diagnosis of  By: Linna Darner MD, Gwyndolyn Saxon    . Other abnormal glucose 01/07/2012   Fasting blood sugar ranged 103-116 hospitalized March/April 2013.   Marland Kitchen PONV (postoperative nausea and vomiting)   . Postoperative atrial fibrillation ([place]) 08/2011   Amiodarone  . S/P CABG x 5 09/24/2011   09/12/2011   . Shortness of breath dyspnea     Past Surgical History:  Procedure Laterality Date  . Arthroscopic knee surgery Left    left  . CARDIAC CATHETERIZATION N/A 07/14/2015   Procedure: Left Heart Cath and Coronary Angiography;  Surgeon: Burnell Blanks, MD;  Location: [facility];  Service: Cardiovascular;  Laterality: N/A;  . COLONOSCOPY  2005   negative; [facility]  . CORONARY ARTERY BYPASS GRAFT  09/12/2011  Procedure: CORONARY ARTERY BYPASS GRAFTING (CABG);  Surgeon: Ivin Poot, MD;  Location: [facility];  Service: Open Heart Surgery;  Laterality: N/A;  . CORONARY ARTERY BYPASS GRAFT N/A 08/01/2015   Procedure: REDO CORONARY ARTERY BYPASS GRAFTING x two (CABG) x   using left radial artery, and left leg greater saphenous vein harvested endoscopically, also performed coronary endarderectomy. Placement of Intra aortic balloon pump (IAB).;  Surgeon: Ivin Poot, MD;  Location: [facility];  Service: Open Heart Surgery;  Laterality: N/A;  . ESOPHAGOGASTRODUODENOSCOPY (EGD) WITH PROPOFOL N/A 02/16/2014   Procedure: ESOPHAGOGASTRODUODENOSCOPY (EGD) WITH PROPOFOL;  Surgeon: Winfield Cunas., MD;   Location: [facility];  Service: Endoscopy;  Laterality: N/A;  H&P in file  . ORCHIECTOMY     left for scar tissue post torsion; Dr Hartley Barefoot  . RADIAL ARTERY HARVEST Left 08/01/2015   Procedure: RADIAL ARTERY HARVEST;  Surgeon: Ivin Poot, MD;  Location: [facility];  Service: Open Heart Surgery;  Laterality: Left;  . TEE WITHOUT CARDIOVERSION N/A 08/01/2015   Procedure: TRANSESOPHAGEAL ECHOCARDIOGRAM (TEE);  Surgeon: Ivin Poot, MD;  Location: [facility];  Service: Open Heart Surgery;  Laterality: N/A;  . Tympanic membranes Left    perforation X 3 of  L  TM  ;surgically repaired     Current Outpatient Prescriptions  Medication Sig Dispense Refill  . acyclovir ointment (ZOVIRAX) 5 % Apply 1 application topically as needed (for fever blisters). 15 g 0  . ALPRAZolam (XANAX) 0.5 MG tablet Take 0.5 tablets (0.25 mg total) by mouth at bedtime as needed. 30 tablet 0  . aspirin 81 MG tablet Take 81 mg by mouth daily.    . lansoprazole (PREVACID) 30 MG capsule TAKE 1 CAPSULE (30 MG TOTAL) BY MOUTH DAILY. 90 capsule 1  . lisinopril (PRINIVIL,ZESTRIL) 5 MG tablet Take 1 tablet (5 mg total) by mouth 2 (two) times daily. 180 tablet 0  . metoprolol tartrate (LOPRESSOR) 25 MG tablet Take 1 tablet (25 mg total) by mouth 2 (two) times daily. 180 tablet 0  . nitroGLYCERIN (NITROSTAT) 0.4 MG SL tablet Place 1 tablet under the tongue.    . Omega-3 Fatty Acids (FISH OIL) 1200 MG CAPS Take 1,200 mg by mouth 2 (two) times daily.     . pravastatin (PRAVACHOL) 40 MG tablet Take 1 tablet (40 mg total) by mouth every evening. 90 tablet 0  . zolpidem (AMBIEN) 10 MG tablet Take 1 tablet (10 mg total) by mouth at bedtime. 30 tablet 0   No current facility-administered medications for this visit.     Allergies:   Sulfa antibiotics and Tape    ROS:  Please see the history of present illness.   Otherwise, review of systems are positive for back pain.   All other systems are reviewed and negative.    PHYSICAL  EXAM: VS:  BP (!) 165/83   Pulse 84   Ht 5\' 9"  (1.753 m)   Wt 187 lb 6.4 oz (85 kg)   SpO2 96%   BMI 27.67 kg/m  , BMI Body mass index is 27.67 kg/m. GENERAL:  Well appearing NECK:  No jugular venous distention, waveform within normal limits, carotid upstroke brisk and symmetric, no bruits, no thyromegaly LUNGS:  Clear to auscultation bilaterally BACK:  No CVA tenderness CHEST:  Well healed sternotomy scar. HEART:  PMI not displaced or sustained,S1 and S2 within normal limits, no S3, no S4, no clicks, no rubs, no murmurs ABD:  Flat, positive bowel sounds normal in  frequency in pitch, no bruits, no rebound, no guarding, no midline pulsatile mass, no hepatomegaly, no splenomegaly EXT:  2 plus pulses throughout, no edema, no cyanosis no clubbing, left radial harvest site intact   EKG:  EKG is not  ordered today.    Recent Labs: 08/02/2015: Magnesium 2.2 08/07/2015: ALT 31; Hemoglobin 9.1; Platelets 125 08/08/2015: BUN 18; Creatinine, Ser 0.90; Potassium 4.3; Sodium 136    Lipid Panel    Component Value Date/Time   CHOL 173 10/26/2015 0802   TRIG 81 10/26/2015 0802   HDL 62 10/26/2015 0802   CHOLHDL 2.8 10/26/2015 0802   CHOLHDL 3.1 06/03/2014 0841   VLDL 13 06/03/2014 0841   LDLCALC 95 10/26/2015 0802   LDLDIRECT 146.5 11/29/2009 0839      Wt Readings from Last 3 Encounters:  04/16/16 187 lb 6.4 oz (85 kg)  12/20/15 182 lb 12.2 oz (82.9 kg)  10/19/15 185 lb 9.6 oz (84.2 kg)      Other studies Reviewed: Additional studies/ records that were reviewed today include: None. Review of the above records demonstrates:     ASSESSMENT AND PLAN:   S/P CABG x 5 2013 with re CABG x 27 Jul 2015:  Doing well.  We will continue with aggressive secondary risk reduction.   Postoperative atrial fibrillation  :  He has regular rhythm and is off the amiodarone. No further change in therapy is indicated.  Essential hypertension  The blood pressure is elevated. I'm going to increase  his lisinopril to 10 mg twice daily and he will keep a blood pressure diary with more frequent readings.  Hyperlipidemia:  I will obtain a lipid profile. His LDL was 95 with a good HDL in the 60s but I like his LDL to be in the 70s.   Current medicines are reviewed at length with the patient today.  The patient does not have concerns regarding medicines.  The following changes have been made:  See above  Labs/ tests ordered today include:   No orders of the defined types were placed in this encounter.    Disposition:   FU with me in 62months.     Signed, Minus Breeding, MD  04/16/2016 1:57 PM    [facility]

## 2016-04-16 ENCOUNTER — Ambulatory Visit (INDEPENDENT_AMBULATORY_CARE_PROVIDER_SITE_OTHER): Payer: BLUE CROSS/BLUE SHIELD | Admitting: Cardiology

## 2016-04-16 ENCOUNTER — Encounter: Payer: Self-pay | Admitting: Cardiology

## 2016-04-16 VITALS — BP 165/83 | HR 84 | Ht 69.0 in | Wt 187.4 lb

## 2016-04-16 DIAGNOSIS — I10 Essential (primary) hypertension: Secondary | ICD-10-CM

## 2016-04-16 DIAGNOSIS — I25810 Atherosclerosis of coronary artery bypass graft(s) without angina pectoris: Secondary | ICD-10-CM

## 2016-04-16 DIAGNOSIS — E785 Hyperlipidemia, unspecified: Secondary | ICD-10-CM

## 2016-04-16 MED ORDER — LISINOPRIL 10 MG PO TABS: 10.0000 mg | ORAL_TABLET | Freq: Two times a day (BID) | ORAL | 3 refills | Status: DC

## 2016-04-16 NOTE — Patient Instructions (Addendum)
Medication Instructions:  INCREASE Lisinopril to 10 mg twice daily  Labwork: Your physician recommends that you return for lab work at your earliest [place].  Testing/Procedures: NONE ORDERED  Follow-Up: Your physician wants you to follow-up in 6 months with Dr Percival Spanish.  You will receive a reminder letter in the mail two months in advance. If you don't receive a letter, please call our office to schedule the follow-up appointment.  If you need a refill on your cardiac medications before your next appointment, please call your pharmacy.    Blood pressure goal - 140/90

## 2016-05-23 ENCOUNTER — Other Ambulatory Visit: Payer: Self-pay | Admitting: Cardiology

## 2016-05-23 NOTE — Telephone Encounter (Signed)
REFILL 

## 2016-08-12 ENCOUNTER — Other Ambulatory Visit: Payer: Self-pay | Admitting: Cardiology

## 2016-08-15 ENCOUNTER — Ambulatory Visit (INDEPENDENT_AMBULATORY_CARE_PROVIDER_SITE_OTHER): Payer: BLUE CROSS/BLUE SHIELD | Admitting: Pharmacist

## 2016-08-15 ENCOUNTER — Telehealth: Payer: Self-pay | Admitting: Cardiology

## 2016-08-15 VITALS — BP 184/110 | HR 70

## 2016-08-15 DIAGNOSIS — I10 Essential (primary) hypertension: Secondary | ICD-10-CM

## 2016-08-15 MED ORDER — HYDRALAZINE HCL 25 MG PO TABS: 25.0000 mg | ORAL_TABLET | Freq: Two times a day (BID) | ORAL | 0 refills | Status: DC | PRN

## 2016-08-15 MED ORDER — NITROGLYCERIN 0.4 MG SL SUBL: 0.4000 mg | SUBLINGUAL_TABLET | SUBLINGUAL | 3 refills | Status: DC | PRN

## 2016-08-15 MED ORDER — LISINOPRIL 20 MG PO TABS: 20.0000 mg | ORAL_TABLET | Freq: Two times a day (BID) | ORAL | 11 refills | Status: DC

## 2016-08-15 NOTE — Progress Notes (Signed)
Patient ID: Manuel Bell                 DOB: 06/20/1956                      MRN: [id number]     HPI:  Manuel Bell is a 61 y.o. male referred by Dr. Percival Spanish to HTN clinic.  PMI includes CABG x 5 on 2003 and CABG x 2 on Feb/2017, Afib, hypertension and hyperlipidemia.  Lisinopril dose was increased from 5mg  twice daily to lisinopril 10 mg twice daily on Oct/2017.  Patient called clinic today at [facility] to notify increased BP with readings of 210/110 and 161/106 this morning.    Patient presets to clinic for hypertension follow up.  Denies fatigue or chest pain but reports feeling light headaches when BP in the 200's.  Current HTN meds:  Lisinopril 10mg  po twice daily Metoprolol tartrate 25mg  twice daily  Previously tried:  Amlodipine 10mg  daily Coreg 12.5mg  twice daily Lisinopril 20mg  twice daily HCTZ 25mg  daily  BP goal: <130/80  Family History: The patient's family history includes Heart attack (age of onset: 87) in his maternal grandfather; Hyperlipidemia in his sister; Lung cancer (age of onset: 34) in his mother. There is no history of Diabetes or Stroke.   Social History: Former smoker, quit drinking recently  Diet: low sodium, low caffeine  Exercise: mainly at work  Home BP readings: no records available; reports systolic reading [id number] for few days now  Wt Readings from Last 3 Encounters:  04/16/16 187 lb 6.4 oz (85 kg)  12/20/15 182 lb 12.2 oz (82.9 kg)  10/19/15 185 lb 9.6 oz (84.2 kg)   BP Readings from Last 3 Encounters:  08/15/16 (!) 184/110  04/16/16 (!) 165/83  12/20/15 (!) 150/84   Pulse Readings from Last 3 Encounters:  08/15/16 70  04/16/16 84  12/20/15 83    Renal function: CrCl cannot be calculated (Patient's most recent lab result is older than the maximum 21 days allowed.).  Past Medical History:  Diagnosis Date  . Alcohol abuse   . Arthritis   . Asthma    as a child   . CAD (coronary artery disease)    LHC 09/11/11: 3 vessel CAD, EF  45-50%  . CARRIER, VIRAL HEPATITIS C 01/28/2007   Qualifier: Diagnosis of  By: Linna Darner MD, Gwyndolyn Saxon    . Complication of anesthesia    slow to awaken  . Coronary atherosclerosis of native coronary artery 10/02/2011  . Dysrhythmia    palpations at times  . ELEVATION, TRANSAMINASE/LDH LEVELS 02/06/2007   Qualifier: Diagnosis of  By: Janelle Floor    . Family history of anesthesia complication   . GERD 10/19/2008   Qualifier: Diagnosis of  By: Linna Darner MD, Gwyndolyn Saxon    . GERD (gastroesophageal reflux disease)   . Hepatitis C    s/p interferon/[facility]; chronically elevated LFTs. treated 2015- 2016- Completed 07/2014  . Hx of CABG 08/2011   Dr. Prescott Gum - LIMA to LAD, SVG-D1, SVG-distal RCA, SVG-RI/distal circumflex  . Hyperlipidemia   . HYPERLIPIDEMIA 11/22/2009   Qualifier: Diagnosis of  By: Linna Darner MD, Gwyndolyn Saxon   Pravastatin 40 mg started post CBAG 08/2011      . HYPERTENSION, ESSENTIAL NOS 08/20/2007   Qualifier: Diagnosis of  By: Linna Darner MD, Gwyndolyn Saxon     . LUMBAR RADICULOPATHY, RIGHT 01/02/2010   Qualifier: Diagnosis of  By: Linna Darner MD, Gwyndolyn Saxon    . NEURALGIA 11/22/2009  Qualifier: Diagnosis of  By: Linna Darner MD, Gwyndolyn Saxon    . Other abnormal glucose 01/07/2012   Fasting blood sugar ranged 103-116 hospitalized March/April 2013.   Marland Kitchen PONV (postoperative nausea and vomiting)   . Postoperative atrial fibrillation ([place]) 08/2011   Amiodarone  . S/P CABG x 5 09/24/2011   09/12/2011   . Shortness of breath dyspnea     Current Outpatient Prescriptions on File Prior to Visit  Medication Sig Dispense Refill  . acyclovir ointment (ZOVIRAX) 5 % Apply 1 application topically as needed (for fever blisters). 15 g 0  . ALPRAZolam (XANAX) 0.5 MG tablet Take 0.5 tablets (0.25 mg total) by mouth at bedtime as needed. 30 tablet 0  . aspirin 81 MG tablet Take 81 mg by mouth daily.    . lansoprazole (PREVACID) 30 MG capsule Take 1 capsule (30 mg total) by mouth daily. 90 capsule 2  . metoprolol tartrate (LOPRESSOR) 25 MG  tablet TAKE 1 TABLET TWICE A DAY 180 tablet 1  . nitroGLYCERIN (NITROSTAT) 0.4 MG SL tablet Place 1 tablet under the tongue.    . Omega-3 Fatty Acids (FISH OIL) 1200 MG CAPS Take 1,200 mg by mouth 2 (two) times daily.     . pravastatin (PRAVACHOL) 40 MG tablet TAKE 1 TABLET EVERY EVENING 90 tablet 1  . zolpidem (AMBIEN) 10 MG tablet Take 1 tablet (10 mg total) by mouth at bedtime. 30 tablet 0  . [DISCONTINUED] amLODipine (NORVASC) 10 MG tablet Take 1 tablet (10 mg total) by mouth daily. 90 tablet 0   No current facility-administered medications on file prior to visit.     Allergies  Allergen Reactions  . Sulfa Antibiotics Anxiety    Feels like his head was going to pop off  . Tape Other (See Comments)    Bruising, Please use "paper" tape only.    Blood pressure (!) 184/110, pulse 70, SpO2 97 %.   Essential hypertension:  Blood pressure very elevated during office visit. No chest pain, or headaches reported at this time. Patient reports compliant with Lisinopril and metoprolol therapy.  Noted patient therapy in January 2017 included HCTZ 25mg  daily, Lisinopril 20mg  BID, and metoprolol titrate 50mg  BID for BP management.  Will increase Lisinopril to 20mg  BID today and initiate hydralazine 25mg  BID PRN for systolic BP [id number].  Patient to repeat BMET within 1 week, keep records of home BP readings to bring to next office visit ,and follow up with HTN clinic in 2 weeks. Plan to add chlorthalidone 25mg  po daily if BP remains uncontrolled during follow up.  Anyelin Mogle Rodriguez-Guzman PharmD, [facility] [place] [id number] 08/15/2016 8:33 PM

## 2016-08-15 NOTE — Telephone Encounter (Signed)
Spoke to patient .  Patient states he has been taking medication , but did miss a evening dose on Monday night - lisinopril 10 mg twice a day.  appt schedule at 2 pm with [facility] - F/U BLOOD PRESSURE PATIENT verbalized understanding

## 2016-08-15 NOTE — Patient Instructions (Addendum)
Return for a  follow up appointment in 2 weeks  Your blood pressure today is 184/110 pulse 70  Check your blood pressure at home daily (if able) and keep record of the readings.  Take your BP meds as follows: Continue metoprolol tartrate 25mg  twice daily **Increase lisinopril to 20 mg twice daily** **Use hydralazine 25mg  tablet if systolic blood pressure > 190**  Bring all of your meds, your BP cuff and your record of home blood pressures to your next appointment.  Exercise as you're able, try to walk approximately 30 minutes per day.  Keep salt intake to a minimum, especially watch canned and prepared boxed foods.  Eat more fresh fruits and vegetables and fewer canned items.  Avoid eating in fast food restaurants.    HOW TO TAKE YOUR BLOOD PRESSURE: . Rest 5 minutes before taking your blood pressure. .  Don't smoke or drink caffeinated beverages for at least 30 minutes before. . Take your blood pressure before (not after) you eat. . Sit comfortably with your back supported and both feet on the floor (don't cross your legs). . Elevate your arm to heart level on a table or a desk. . Use the proper sized cuff. It should fit smoothly and snugly around your bare upper arm. There should be enough room to slip a fingertip under the cuff. The bottom edge of the cuff should be 1 inch above the crease of the elbow. . Ideally, take 3 measurements at one sitting and record the average.

## 2016-08-15 NOTE — Telephone Encounter (Signed)
New Message   Pt c/o BP issue: STAT if pt c/o blurred vision, one-sided weakness or slurred speech  1. What are your last 5 BP readings? 210/110  161/106  2. Are you having any other symptoms (ex. Dizziness, headache, blurred vision, passed out)? Light headache  3. What is your BP issue? Per pt blood pressure has been high for about four days

## 2016-08-20 LAB — BASIC METABOLIC PANEL
BUN: 13 mg/dL (ref 7–25)
CHLORIDE: 104 mmol/L (ref 98–110)
CO2: 28 mmol/L (ref 20–31)
Calcium: 9.5 mg/dL (ref 8.6–10.3)
Creat: 0.92 mg/dL (ref 0.70–1.25)
Glucose, Bld: 91 mg/dL (ref 65–99)
POTASSIUM: 4.8 mmol/L (ref 3.5–5.3)
Sodium: 141 mmol/L (ref 135–146)

## 2016-08-20 LAB — LIPID PANEL
CHOL/HDL RATIO: 3.4 ratio (ref <5.0)
Cholesterol: 140 mg/dL (ref <200)
HDL: 41 mg/dL (ref >40)
LDL Cholesterol: 88 mg/dL (ref <100)
Triglycerides: 55 mg/dL (ref <150)
VLDL: 11 mg/dL (ref <30)

## 2016-08-21 ENCOUNTER — Other Ambulatory Visit: Payer: Self-pay | Admitting: Cardiology

## 2016-08-22 NOTE — Telephone Encounter (Signed)
Spoke with pt letting him know last time we spoke back in October Dr Percival Spanish was only going to refills his Zolpidem and Xanax for 1 month and he will have to get further refills from his PCP.

## 2016-08-29 ENCOUNTER — Ambulatory Visit (INDEPENDENT_AMBULATORY_CARE_PROVIDER_SITE_OTHER): Payer: BLUE CROSS/BLUE SHIELD | Admitting: Pharmacist

## 2016-08-29 VITALS — BP 152/84 | HR 76

## 2016-08-29 DIAGNOSIS — I10 Essential (primary) hypertension: Secondary | ICD-10-CM

## 2016-08-29 MED ORDER — CHLORTHALIDONE 25 MG PO TABS: 25.0000 mg | ORAL_TABLET | Freq: Every day | ORAL | 5 refills | Status: DC

## 2016-08-29 NOTE — Patient Instructions (Addendum)
Return for a follow up appointment in 4 weeks  Your blood pressure today is 152/84 pulse 76  Check your blood pressure at home daily (if able) and keep record of the readings.  Take your BP meds as follows: Lisinopril 20mg  po twice daily Metoprolol tartrate 25mg  twice daily **chlorthalidone 25mg  daily** Hydralazine 25mg  twice daily as needed for SBP >190  **Blood work in 1-2 weeks after start of new medication**   Bring all of your meds, your BP cuff and your record of home blood pressures to your next appointment.  Exercise as you're able, try to walk approximately 30 minutes per day.  Keep salt intake to a minimum, especially watch canned and prepared boxed foods.  Eat more fresh fruits and vegetables and fewer canned items.  Avoid eating in fast food restaurants.    HOW TO TAKE YOUR BLOOD PRESSURE: . Rest 5 minutes before taking your blood pressure. .  Don't smoke or drink caffeinated beverages for at least 30 minutes before. . Take your blood pressure before (not after) you eat. . Sit comfortably with your back supported and both feet on the floor (don't cross your legs). . Elevate your arm to heart level on a table or a desk. . Use the proper sized cuff. It should fit smoothly and snugly around your bare upper arm. There should be enough room to slip a fingertip under the cuff. The bottom edge of the cuff should be 1 inch above the crease of the elbow. . Ideally, take 3 measurements at one sitting and record the average.

## 2016-08-29 NOTE — Progress Notes (Signed)
Patient ID: Manuel Bell                 DOB: 08-Mar-1956                      MRN: [id number]     HPI:  Manuel Bell is a 61 y.o. male referred by Dr. Percival Spanish to HTN clinic.  PMI includes CABG x 5 on 2003 and CABG x 2 on Feb/2017, A-Fib, hypertension and hyperlipidemia.  Lisinopril dose was increased from 10mg  twice daily to Lisinopril 20 mg twice daily on 08/15/16.  Hydralazine 25mg  po twice daily was also initiated on February /22/2018 to use if SBP >190. Patient presets to clinic for hypertension follow up.  Reports decrease in headaches and denies fatigue, dizziness or chest pain.  Current HTN meds:  Lisinopril 20mg  po twice daily Metoprolol tartrate 25mg  twice daily Hydralazine 25mg  BID PRN for SBP>190  Previously tried:  Amlodipine 10mg  daily Coreg 12.5mg  twice daily Lisinopril 20mg  twice daily HCTZ 25mg  daily  BP goal: <130/80  Family History: The patient's family history includes Heart attack (age of onset: 60) in his maternal grandfather; Hyperlipidemia in his sister; Lung cancer (age of onset: 56) in his mother. There is no history of Diabetes or Stroke.  Social History: Former smoker, quit drinking recently  Diet: low sodium, low caffeine  Exercise: mainly at work  Home BP readings: 15 readings; 165/90 average (pulse  69bpm avenger)  Wt Readings from Last 3 Encounters:  04/16/16 187 lb 6.4 oz (85 kg)  12/20/15 182 lb 12.2 oz (82.9 kg)  10/19/15 185 lb 9.6 oz (84.2 kg)   BP Readings from Last 3 Encounters:  08/29/16 (!) 152/84  08/15/16 (!) 184/110  04/16/16 (!) 165/83   Pulse Readings from Last 3 Encounters:  08/29/16 76  08/15/16 70  04/16/16 84     Past Medical History:  Diagnosis Date  . Alcohol abuse   . Arthritis   . Asthma    as a child   . CAD (coronary artery disease)    LHC 09/11/11: 3 vessel CAD, EF 45-50%  . CARRIER, VIRAL HEPATITIS C 01/28/2007   Qualifier: Diagnosis of  By: Linna Darner MD, Gwyndolyn Saxon    . Complication of anesthesia    slow to awaken  . Coronary atherosclerosis of native coronary artery 10/02/2011  . Dysrhythmia    palpations at times  . ELEVATION, TRANSAMINASE/LDH LEVELS 02/06/2007   Qualifier: Diagnosis of  By: Janelle Floor    . Family history of anesthesia complication   . GERD 10/19/2008   Qualifier: Diagnosis of  By: Linna Darner MD, Gwyndolyn Saxon    . GERD (gastroesophageal reflux disease)   . Hepatitis C    s/p interferon/[facility]; chronically elevated LFTs. treated 2015- 2016- Completed 07/2014  . Hx of CABG 08/2011   Dr. Prescott Gum - LIMA to LAD, SVG-D1, SVG-distal RCA, SVG-RI/distal circumflex  . Hyperlipidemia   . HYPERLIPIDEMIA 11/22/2009   Qualifier: Diagnosis of  By: Linna Darner MD, Gwyndolyn Saxon   Pravastatin 40 mg started post CBAG 08/2011      . HYPERTENSION, ESSENTIAL NOS 08/20/2007   Qualifier: Diagnosis of  By: Linna Darner MD, Gwyndolyn Saxon     . LUMBAR RADICULOPATHY, RIGHT 01/02/2010   Qualifier: Diagnosis of  By: Linna Darner MD, [place] 11/22/2009   Qualifier: Diagnosis of  By: Linna Darner MD, Gwyndolyn Saxon    . Other abnormal glucose 01/07/2012   Fasting blood sugar ranged 103-116 hospitalized March/April 2013.   Marland Kitchen  PONV (postoperative nausea and vomiting)   . Postoperative atrial fibrillation ([place]) 08/2011   Amiodarone  . S/P CABG x 5 09/24/2011   09/12/2011   . Shortness of breath dyspnea     Current Outpatient Prescriptions on File Prior to Visit  Medication Sig Dispense Refill  . acyclovir ointment (ZOVIRAX) 5 % Apply 1 application topically as needed (for fever blisters). 15 g 0  . ALPRAZolam (XANAX) 0.5 MG tablet Take 0.5 tablets (0.25 mg total) by mouth at bedtime as needed. 30 tablet 0  . aspirin 81 MG tablet Take 81 mg by mouth daily.    . hydrALAZINE (APRESOLINE) 25 MG tablet Take 1 tablet (25 mg total) by mouth 2 (two) times daily as needed. 20 tablet 0  . lansoprazole (PREVACID) 30 MG capsule Take 1 capsule (30 mg total) by mouth daily. 90 capsule 2  . lisinopril (PRINIVIL,ZESTRIL) 20 MG tablet Take 1  tablet (20 mg total) by mouth 2 (two) times daily. 60 tablet 11  . metoprolol tartrate (LOPRESSOR) 25 MG tablet TAKE 1 TABLET TWICE A DAY 180 tablet 1  . nitroGLYCERIN (NITROSTAT) 0.4 MG SL tablet Place 1 tablet under the tongue.    . nitroGLYCERIN (NITROSTAT) 0.4 MG SL tablet Place 1 tablet (0.4 mg total) under the tongue every 5 (five) minutes as needed for chest pain. 25 tablet 3  . Omega-3 Fatty Acids (FISH OIL) 1200 MG CAPS Take 1,200 mg by mouth 2 (two) times daily.     . pravastatin (PRAVACHOL) 40 MG tablet TAKE 1 TABLET EVERY EVENING 90 tablet 1  . zolpidem (AMBIEN) 10 MG tablet Take 1 tablet (10 mg total) by mouth at bedtime. 30 tablet 0  . [DISCONTINUED] amLODipine (NORVASC) 10 MG tablet Take 1 tablet (10 mg total) by mouth daily. 90 tablet 0   No current facility-administered medications on file prior to visit.     Allergies  Allergen Reactions  . Sulfa Antibiotics Anxiety    Feels like his head was going to pop off  . Tape Other (See Comments)    Bruising, Please use "paper" tape only.    Blood pressure (!) 152/84, pulse 76, SpO2 98 %.   Essential hypertension: Blood pressure today remains above goal but greatly improved from previous readings.  Home BP reading also above goal with an average reading of 165/90, and patient also tolerating therapy well.  Will add chlorthalidone 25mg  daily to current therapy and repeat BMET in 1 week.  Next F/U appointment with HTN clinic in 4 weeks.  Daiana Vitiello Rodriguez-Guzman PharmD, [name] [id number] 08/30/2016 4:55 PM

## 2016-09-26 ENCOUNTER — Ambulatory Visit (INDEPENDENT_AMBULATORY_CARE_PROVIDER_SITE_OTHER): Payer: BLUE CROSS/BLUE SHIELD | Admitting: Pharmacist Clinician (PhC)/ Clinical Pharmacy Specialist

## 2016-09-26 DIAGNOSIS — I10 Essential (primary) hypertension: Secondary | ICD-10-CM

## 2016-09-26 MED ORDER — CHLORTHALIDONE 25 MG PO TABS: 50.0000 mg | ORAL_TABLET | Freq: Every day | ORAL | 3 refills | Status: DC

## 2016-09-26 NOTE — Progress Notes (Signed)
Patient ID: Manuel Bell                 DOB: 05-21-1956                      MRN: [id number]     HPI:  Manuel Bell is a 61 y.o. male referred by Dr. Percival Spanish to HTN clinic.  PMI includes CABG x 5 in 2003 and CABG x 2 in Feb 2017, A-Fib, hypertension and hyperlipidemia.  Since February his lisinopril dose was increased from 10mg  twice daily to 20 mg twice daily and he was given hydralazine 25 mg to use if SBP >190. At his last visit we added chlorthalidone 25 mg daily.  He reports no problems with this current regimen and notes he has not had to take the hydralazine at all in the past 2-3 weeks.  Today he feels well and denies fatigue, dizziness or chest pain.  He notes occasional swelling in his right foot and DOE, neither of which are regular.    Current HTN meds:  Lisinopril 20mg  po twice daily Metoprolol tartrate 25mg  twice daily Chlorthalidone 25 mg qam Hydralazine 25mg  BID PRN for SBP>190  Previously tried:  Amlodipine 10mg  daily Coreg 12.5mg  twice daily HCTZ 25mg  daily  BP goal: <130/80  Family History: The patient's family history includes Heart attack (age of onset: 31) in his maternal grandfather; Hyperlipidemia in his sister; Lung cancer (age of onset: 43) in his mother. There is no history of Diabetes or Stroke.  Social History: Former smoker, drinks 2 glasses of wine several times each week  Diet: low sodium, just 1 cup of coffee per day  Exercise: mainly at work  Home BP readings: 15 readings; 151/90 average, down from 165/90 at last visit.  Diastolic readings remain mostly unchanged.  Range 129-167/79-100.  We have not tested his home meter in the office   Wt Readings from Last 3 Encounters:  04/16/16 187 lb 6.4 oz (85 kg)  12/20/15 182 lb 12.2 oz (82.9 kg)  10/19/15 185 lb 9.6 oz (84.2 kg)   BP Readings from Last 3 Encounters:  09/26/16 (!) 142/82  08/29/16 (!) 152/84  08/15/16 (!) 184/110   Pulse Readings from Last 3 Encounters:  08/29/16 76    08/15/16 70  04/16/16 84     Past Medical History:  Diagnosis Date  . Alcohol abuse   . Arthritis   . Asthma    as a child   . CAD (coronary artery disease)    LHC 09/11/11: 3 vessel CAD, EF 45-50%  . CARRIER, VIRAL HEPATITIS C 01/28/2007   Qualifier: Diagnosis of  By: Manuel Darner MD, Manuel Bell    . Complication of anesthesia    slow to awaken  . Coronary atherosclerosis of native coronary artery 10/02/2011  . Dysrhythmia    palpations at times  . ELEVATION, TRANSAMINASE/LDH LEVELS 02/06/2007   Qualifier: Diagnosis of  By: Manuel Bell    . Family history of anesthesia complication   . GERD 10/19/2008   Qualifier: Diagnosis of  By: Manuel Darner MD, Manuel Bell    . GERD (gastroesophageal reflux disease)   . Hepatitis C    s/p interferon/[facility]; chronically elevated LFTs. treated 2015- 2016- Completed 07/2014  . Hx of CABG 08/2011   Dr. Prescott Bell - LIMA to LAD, SVG-D1, SVG-distal RCA, SVG-RI/distal circumflex  . Hyperlipidemia   . HYPERLIPIDEMIA 11/22/2009   Qualifier: Diagnosis of  By: Manuel Darner MD, Manuel Bell   Pravastatin 40 mg  started post CBAG 08/2011      . HYPERTENSION, ESSENTIAL NOS 08/20/2007   Qualifier: Diagnosis of  By: Manuel Darner MD, Manuel Bell     . LUMBAR RADICULOPATHY, RIGHT 01/02/2010   Qualifier: Diagnosis of  By: Manuel Darner MD, Manuel Bell 11/22/2009   Qualifier: Diagnosis of  By: Manuel Darner MD, Manuel Bell    . Other abnormal glucose 01/07/2012   Fasting blood sugar ranged 103-116 hospitalized March/April 2013.   Marland Kitchen PONV (postoperative nausea and vomiting)   . Postoperative atrial fibrillation ([place]) 08/2011   Amiodarone  . S/P CABG x 5 09/24/2011   09/12/2011   . Shortness of breath dyspnea     Current Outpatient Prescriptions on File Prior to Visit  Medication Sig Dispense Refill  . acyclovir ointment (ZOVIRAX) 5 % Apply 1 application topically as needed (for fever blisters). 15 g 0  . ALPRAZolam (XANAX) 0.5 MG tablet Take 0.5 tablets (0.25 mg total) by mouth at bedtime as needed. 30  tablet 0  . aspirin 81 MG tablet Take 81 mg by mouth daily.    . hydrALAZINE (APRESOLINE) 25 MG tablet Take 1 tablet (25 mg total) by mouth 2 (two) times daily as needed. 20 tablet 0  . lansoprazole (PREVACID) 30 MG capsule Take 1 capsule (30 mg total) by mouth daily. 90 capsule 2  . lisinopril (PRINIVIL,ZESTRIL) 20 MG tablet Take 1 tablet (20 mg total) by mouth 2 (two) times daily. 60 tablet 11  . metoprolol tartrate (LOPRESSOR) 25 MG tablet TAKE 1 TABLET TWICE A DAY 180 tablet 1  . nitroGLYCERIN (NITROSTAT) 0.4 MG SL tablet Place 1 tablet under the tongue.    . nitroGLYCERIN (NITROSTAT) 0.4 MG SL tablet Place 1 tablet (0.4 mg total) under the tongue every 5 (five) minutes as needed for chest pain. 25 tablet 3  . Omega-3 Fatty Acids (FISH OIL) 1200 MG CAPS Take 1,200 mg by mouth 2 (two) times daily.     . pravastatin (PRAVACHOL) 40 MG tablet TAKE 1 TABLET EVERY EVENING 90 tablet 1  . zolpidem (AMBIEN) 10 MG tablet Take 1 tablet (10 mg total) by mouth at bedtime. 30 tablet 0  . [DISCONTINUED] amLODipine (NORVASC) 10 MG tablet Take 1 tablet (10 mg total) by mouth daily. 90 tablet 0   No current facility-administered medications on file prior to visit.     Allergies  Allergen Reactions  . Sulfa Antibiotics Anxiety    Feels like his head was going to pop off  . Tape Other (See Comments)    Bruising, Please use "paper" tape only.    Blood pressure (!) 142/82.   Essential hypertension:  With addition of chlorthalidone 25 mg we saw a 15 point drop in average home BP readings.  Will increase that to 50 mg daily for now and have him continue monitoring.  He is to get a BMET in 10 days and we will see him again in 6 weeks for follow up.    Manuel Bell PharmD CPP [facility] [address] [place] [address] 09/27/2016 7:54 AM

## 2016-09-26 NOTE — Patient Instructions (Signed)
Return for a a follow up appointment in 6 weeks  Your blood pressure today is 142/82  (goal is < 130/80)  Check your blood pressure at home several times each week and keep record of the readings.  Take your BP meds as follows:  Increase chlorthalidone to 50 mg (1 tabs) daily  Continue with all other medications  Bring all of your meds, your BP cuff and your record of home blood pressures to your next appointment.  Exercise as you're able, try to walk approximately 30 minutes per day.  Keep salt intake to a minimum, especially watch canned and prepared boxed foods.  Eat more fresh fruits and vegetables and fewer canned items.  Avoid eating in fast food restaurants.    HOW TO TAKE YOUR BLOOD PRESSURE: . Rest 5 minutes before taking your blood pressure. .  Don't smoke or drink caffeinated beverages for at least 30 minutes before. . Take your blood pressure before (not after) you eat. . Sit comfortably with your back supported and both feet on the floor (don't cross your legs). . Elevate your arm to heart level on a table or a desk. . Use the proper sized cuff. It should fit smoothly and snugly around your bare upper arm. There should be enough room to slip a fingertip under the cuff. The bottom edge of the cuff should be 1 inch above the crease of the elbow. . Ideally, take 3 measurements at one sitting and record the average.

## 2016-09-27 ENCOUNTER — Encounter: Payer: Self-pay | Admitting: Pharmacist Clinician (PhC)/ Clinical Pharmacy Specialist

## 2016-09-27 NOTE — Assessment & Plan Note (Addendum)
With addition of chlorthalidone 25 mg we saw a 15 point drop in average home BP readings.  Will increase that to 50 mg daily for now and have him continue monitoring.  He is to get a BMET in 10 days and we will see him again in 6 weeks for follow up.

## 2016-10-21 ENCOUNTER — Other Ambulatory Visit: Payer: Self-pay | Admitting: Cardiology

## 2016-10-21 DIAGNOSIS — I10 Essential (primary) hypertension: Secondary | ICD-10-CM | POA: Diagnosis not present

## 2016-10-21 LAB — BASIC METABOLIC PANEL
BUN: 15 mg/dL (ref 7–25)
CALCIUM: 9.6 mg/dL (ref 8.6–10.3)
CO2: 29 mmol/L (ref 20–31)
CREATININE: 0.98 mg/dL (ref 0.70–1.25)
Chloride: 96 mmol/L — ABNORMAL LOW (ref 98–110)
Glucose, Bld: 117 mg/dL — ABNORMAL HIGH (ref 65–99)
Potassium: 4.1 mmol/L (ref 3.5–5.3)
Sodium: 136 mmol/L (ref 135–146)

## 2016-10-29 ENCOUNTER — Other Ambulatory Visit: Payer: Self-pay | Admitting: Cardiology

## 2016-11-06 NOTE — Progress Notes (Signed)
Cardiology Office Note   Date:  11/08/2016   ID:  Manuel Bell, DOB September 21, 1955, MRN [id number]  PCP:  Patient, No Pcp Per  Cardiologist:   Minus Breeding, MD   No chief complaint on file.     History of Present Illness: Manuel Bell is a 61 y.o. male who presents for follow-up after redo CABG. He presented in January with dyspnea and subsequently had cath demonstrating 2 of 5 bypass grafts patent. He underwent redo CABG with a left radial artery to ramus intermediate and SVG to posterior lateral. He did have some postoperative atrial fibrillation. He was on amiodarone but is off of this and at the last visit he was maintaining NSR.    He is doing well now.  BP is well controlled.  The patient denies any new symptoms such as chest discomfort, neck or arm discomfort. There has been no new shortness of breath, PND or orthopnea. There have been no reported palpitations, presyncope or syncope.   Past Medical History:  Diagnosis Date  . Alcohol abuse   . Arthritis   . Asthma    as a child   . CAD (coronary artery disease)    LHC 09/11/11: 3 vessel CAD, EF 45-50%  . CARRIER, VIRAL HEPATITIS C 01/28/2007   Qualifier: Diagnosis of  By: Linna Darner MD, Gwyndolyn Saxon    . Complication of anesthesia    slow to awaken  . Coronary atherosclerosis of native coronary artery 10/02/2011  . Dysrhythmia    palpations at times  . ELEVATION, TRANSAMINASE/LDH LEVELS 02/06/2007   Qualifier: Diagnosis of  By: Janelle Floor    . Family history of anesthesia complication   . GERD 10/19/2008   Qualifier: Diagnosis of  By: Linna Darner MD, Gwyndolyn Saxon    . GERD (gastroesophageal reflux disease)   . Hepatitis C    s/p interferon/[facility] Rx; chronically elevated LFTs. treated 2015- 2016- Completed 07/2014  . Hx of CABG 08/2011   Dr. Prescott Gum - LIMA to LAD, SVG-D1, SVG-distal RCA, SVG-RI/distal circumflex  . Hyperlipidemia   . HYPERLIPIDEMIA 11/22/2009   Qualifier: Diagnosis of  By: Linna Darner MD, Gwyndolyn Saxon   Pravastatin 40 mg  started post CBAG 08/2011      . HYPERTENSION, ESSENTIAL NOS 08/20/2007   Qualifier: Diagnosis of  By: Linna Darner MD, Gwyndolyn Saxon     . LUMBAR RADICULOPATHY, RIGHT 01/02/2010   Qualifier: Diagnosis of  By: Linna Darner MD, [place] 11/22/2009   Qualifier: Diagnosis of  By: Linna Darner MD, Gwyndolyn Saxon    . Other abnormal glucose 01/07/2012   Fasting blood sugar ranged 103-116 hospitalized March/April 2013.   Marland Kitchen PONV (postoperative nausea and vomiting)   . Postoperative atrial fibrillation ([place]) 08/2011   Amiodarone  . S/P CABG x 5 09/24/2011   09/12/2011   . Shortness of breath dyspnea     Past Surgical History:  Procedure Laterality Date  . Arthroscopic knee surgery Left    left  . CARDIAC CATHETERIZATION N/A 07/14/2015   Procedure: Left Heart Cath and Coronary Angiography;  Surgeon: Burnell Blanks, MD;  Location: [facility];  Service: Cardiovascular;  Laterality: N/A;  . COLONOSCOPY  2005   negative; [facility]  . CORONARY ARTERY BYPASS GRAFT  09/12/2011   Procedure: CORONARY ARTERY BYPASS GRAFTING (CABG);  Surgeon: Ivin Poot, MD;  Location: [facility];  Service: Open Heart Surgery;  Laterality: N/A;  . CORONARY ARTERY BYPASS GRAFT N/A 08/01/2015   Procedure: REDO CORONARY ARTERY BYPASS GRAFTING  x two (CABG) x   using left radial artery, and left leg greater saphenous vein harvested endoscopically, also performed coronary endarderectomy. Placement of Intra aortic balloon pump (IAB).;  Surgeon: Ivin Poot, MD;  Location: [facility];  Service: Open Heart Surgery;  Laterality: N/A;  . ESOPHAGOGASTRODUODENOSCOPY (EGD) WITH PROPOFOL N/A 02/16/2014   Procedure: ESOPHAGOGASTRODUODENOSCOPY (EGD) WITH PROPOFOL;  Surgeon: Winfield Cunas., MD;  Location: [facility];  Service: Endoscopy;  Laterality: N/A;  H&P in file  . ORCHIECTOMY     left for scar tissue post torsion; Dr Hartley Barefoot  . RADIAL ARTERY HARVEST Left 08/01/2015   Procedure: RADIAL ARTERY HARVEST;  Surgeon: Ivin Poot, MD;   Location: [place] [facility];  Service: Open Heart Surgery;  Laterality: Left;  . TEE WITHOUT CARDIOVERSION N/A 08/01/2015   Procedure: TRANSESOPHAGEAL ECHOCARDIOGRAM (TEE);  Surgeon: Ivin Poot, MD;  Location: [facility];  Service: Open Heart Surgery;  Laterality: N/A;  . Tympanic membranes Left    perforation X 3 of  L  TM  ;surgically repaired     Current Outpatient Prescriptions  Medication Sig Dispense Refill  . acyclovir ointment (ZOVIRAX) 5 % Apply 1 application topically as needed (for fever blisters). 15 g 0  . ALPRAZolam (XANAX) 0.5 MG tablet Take 0.5 tablets (0.25 mg total) by mouth at bedtime as needed. 30 tablet 0  . aspirin 81 MG tablet Take 81 mg by mouth daily.    . chlorthalidone (HYGROTON) 25 MG tablet Take 2 tablets (50 mg total) by mouth daily. 180 tablet 3  . hydrALAZINE (APRESOLINE) 25 MG tablet Take 1 tablet (25 mg total) by mouth 2 (two) times daily as needed. 20 tablet 0  . lansoprazole (PREVACID) 30 MG capsule Take 1 capsule (30 mg total) by mouth daily. 90 capsule 2  . lisinopril (PRINIVIL,ZESTRIL) 20 MG tablet Take 1 tablet (20 mg total) by mouth 2 (two) times daily. 60 tablet 11  . metoprolol tartrate (LOPRESSOR) 25 MG tablet TAKE 1 TABLET TWICE A DAY 180 tablet 1  . nitroGLYCERIN (NITROSTAT) 0.4 MG SL tablet Place 1 tablet under the tongue.    . nitroGLYCERIN (NITROSTAT) 0.4 MG SL tablet Place 1 tablet (0.4 mg total) under the tongue every 5 (five) minutes as needed for chest pain. 25 tablet 3  . Omega-3 Fatty Acids (FISH OIL) 1200 MG CAPS Take 1,200 mg by mouth 2 (two) times daily.     . pravastatin (PRAVACHOL) 40 MG tablet Take 1 tablet (40 mg total) by mouth 2 (two) times daily. 90 tablet 0  . zolpidem (AMBIEN) 10 MG tablet Take 1 tablet (10 mg total) by mouth at bedtime. 30 tablet 0   No current facility-administered medications for this visit.     Allergies:   Sulfa antibiotics and Tape    ROS:  Please see the history of present illness.   Otherwise, review of  systems are positive for none.   All other systems are reviewed and negative.    PHYSICAL EXAM: VS:  BP 122/78   Pulse 76   Ht 5\' 9"  (1.753 m)   Wt 179 lb 3.2 oz (81.3 kg)   SpO2 97%   BMI 26.46 kg/m  , BMI Body mass index is 26.46 kg/m.  GENERAL:  Well appearing NECK:  No jugular venous distention, waveform within normal limits, carotid upstroke brisk and symmetric, no bruits, no thyromegaly LUNGS:  Clear to auscultation bilaterally CHEST:  Well healed sternotomy scar. HEART:  PMI not displaced or sustained,S1 and  S2 within normal limits, no S3, no S4, no clicks, no rubs, no murmurs ABD:  Flat, positive bowel sounds normal in frequency in pitch, no bruits, no rebound, no guarding, no midline pulsatile mass, no hepatomegaly, no splenomegaly EXT:  2 plus pulses throughout, no edema, no cyanosis no clubbing    EKG:  EKG is not  ordered .    Recent Labs: 10/21/2016: BUN 15; Creat 0.98; Potassium 4.1; Sodium 136    Lipid Panel    Component Value Date/Time   CHOL 140 08/20/2016 0933   CHOL 173 10/26/2015 0802   TRIG 55 08/20/2016 0933   TRIG 81 10/26/2015 0802   HDL 41 08/20/2016 0933   HDL 62 10/26/2015 0802   CHOLHDL 3.4 08/20/2016 0933   VLDL 11 08/20/2016 0933   LDLCALC 88 08/20/2016 0933   LDLCALC 95 10/26/2015 0802   LDLDIRECT 146.5 11/29/2009 0839      Wt Readings from Last 3 Encounters:  11/07/16 179 lb 3.2 oz (81.3 kg)  04/16/16 187 lb 6.4 oz (85 kg)  12/20/15 182 lb 12.2 oz (82.9 kg)      Other studies Reviewed: Additional studies/ records that were reviewed today include: None. Review of the above records demonstrates:    ASSESSMENT AND PLAN:   S/P CABG x 5 2013 with re CABG x 27 Jul 2015:   The patient has no new sypmtoms.  No further cardiovascular testing is indicated.  We will continue with aggressive risk reduction and meds as listed.  Essential hypertension  The blood pressure is at target. No change in medications is indicated. We will  continue with therapeutic lifestyle changes (TLC).  Hyperlipidemia:    LDL was 88 .  He needs follow up lipid in two months.    Current medicines are reviewed at length with the patient today.  The patient does not have concerns regarding medicines.  The following changes have been made:  None  Labs/ tests ordered today include:    Orders Placed This Encounter  Procedures  . Lipid panel     Disposition:   FU with me in 12  months.     Signed, Minus Breeding, MD  11/08/2016 4:13 AM    [place]

## 2016-11-07 ENCOUNTER — Ambulatory Visit (INDEPENDENT_AMBULATORY_CARE_PROVIDER_SITE_OTHER): Payer: BLUE CROSS/BLUE SHIELD | Admitting: Cardiology

## 2016-11-07 ENCOUNTER — Ambulatory Visit: Payer: BLUE CROSS/BLUE SHIELD

## 2016-11-07 ENCOUNTER — Other Ambulatory Visit: Payer: Self-pay | Admitting: Pharmacist

## 2016-11-07 ENCOUNTER — Encounter: Payer: Self-pay | Admitting: Cardiology

## 2016-11-07 VITALS — BP 122/78 | HR 76 | Ht 69.0 in | Wt 179.2 lb

## 2016-11-07 DIAGNOSIS — E785 Hyperlipidemia, unspecified: Secondary | ICD-10-CM

## 2016-11-07 DIAGNOSIS — I2510 Atherosclerotic heart disease of native coronary artery without angina pectoris: Secondary | ICD-10-CM | POA: Diagnosis not present

## 2016-11-07 DIAGNOSIS — I10 Essential (primary) hypertension: Secondary | ICD-10-CM

## 2016-11-07 MED ORDER — PRAVASTATIN SODIUM 40 MG PO TABS: 40.0000 mg | ORAL_TABLET | Freq: Two times a day (BID) | ORAL | 0 refills | Status: DC

## 2016-11-07 MED ORDER — ZOLPIDEM TARTRATE 10 MG PO TABS: 10.0000 mg | ORAL_TABLET | Freq: Every day | ORAL | 0 refills | Status: DC

## 2016-11-07 NOTE — Patient Instructions (Addendum)
Medication Instructions:  Continue current medications  Labwork: Fasting Lipids in 2 Months  Testing/Procedures: None ordered  Follow-Up: Your physician wants you to follow-up in: 1 Year. You will receive a reminder letter in the mail two months in advance. If you don't receive a letter, please call our office to schedule the follow-up appointment.   Any Other Special Instructions Will Be Listed Below (If Applicable).   If you need a refill on your cardiac medications before your next appointment, please call your pharmacy.

## 2016-11-07 NOTE — Telephone Encounter (Signed)
Patient report he is taking pravastatin twice dialy (1 in the morning and one in evening)

## 2016-11-08 DIAGNOSIS — I2510 Atherosclerotic heart disease of native coronary artery without angina pectoris: Secondary | ICD-10-CM | POA: Insufficient documentation

## 2016-11-10 ENCOUNTER — Other Ambulatory Visit: Payer: Self-pay | Admitting: Cardiology

## 2016-11-11 NOTE — Telephone Encounter (Signed)
Rx request sent to pharmacy.  

## 2016-12-17 ENCOUNTER — Other Ambulatory Visit: Payer: Self-pay | Admitting: *Deleted

## 2016-12-17 MED ORDER — LISINOPRIL 20 MG PO TABS: 20.0000 mg | ORAL_TABLET | Freq: Two times a day (BID) | ORAL | 3 refills | Status: DC

## 2017-01-03 ENCOUNTER — Telehealth: Payer: Self-pay | Admitting: Cardiology

## 2017-01-03 NOTE — Telephone Encounter (Signed)
New message    *STAT* If patient is at the pharmacy, call can be transferred to refill team.   1. Which medications need to be refilled? (please list name of each medication and dose if known) pravastatin (PRAVACHOL) 40 MG tablet  2. Which pharmacy/location (including street and city if local pharmacy) is medication to [facility] sent [facility]  3. Do they need a 30 day or 90 day supply? 90 day supply

## 2017-01-07 MED ORDER — PRAVASTATIN SODIUM 40 MG PO TABS: 40.0000 mg | ORAL_TABLET | Freq: Two times a day (BID) | ORAL | 3 refills | Status: DC

## 2017-01-07 NOTE — Telephone Encounter (Signed)
°*  STAT* If patient is at the pharmacy, call can be transferred to refill team.   1. Which medications need to be refilled? (please list name of each medication and dose if known) Pravastatin 40 MG  2. Which pharmacy/location (including street and city if local pharmacy) is medication to be sent to? [place], [place]  3. Do they need a 30 day or 90 day supply? [place]

## 2017-01-07 NOTE — Telephone Encounter (Signed)
Rx sent to pharmacy electronically.   

## 2017-01-14 ENCOUNTER — Other Ambulatory Visit: Payer: Self-pay | Admitting: *Deleted

## 2017-01-14 MED ORDER — PRAVASTATIN SODIUM 40 MG PO TABS: 40.0000 mg | ORAL_TABLET | Freq: Two times a day (BID) | ORAL | 3 refills | Status: DC

## 2017-02-07 DIAGNOSIS — K5730 Diverticulosis of large intestine without perforation or abscess without bleeding: Secondary | ICD-10-CM | POA: Diagnosis not present

## 2017-02-07 DIAGNOSIS — Z1211 Encounter for screening for malignant neoplasm of colon: Secondary | ICD-10-CM | POA: Diagnosis not present

## 2017-02-07 DIAGNOSIS — K640 First degree hemorrhoids: Secondary | ICD-10-CM | POA: Diagnosis not present

## 2017-02-07 LAB — HM COLONOSCOPY

## 2017-03-11 ENCOUNTER — Encounter: Payer: Self-pay | Admitting: Internal Medicine

## 2017-03-12 DIAGNOSIS — E785 Hyperlipidemia, unspecified: Secondary | ICD-10-CM | POA: Diagnosis not present

## 2017-03-12 LAB — LIPID PANEL
CHOL/HDL RATIO: 4 (calc) (ref <5.0)
Cholesterol: 284 mg/dL — ABNORMAL HIGH (ref <200)
HDL: 71 mg/dL (ref >40)
LDL Cholesterol (Calc): 190 mg/dL (calc) — ABNORMAL HIGH
NON-HDL CHOLESTEROL (CALC): 213 mg/dL — AB (ref <130)
TRIGLYCERIDES: 106 mg/dL (ref <150)

## 2017-03-21 ENCOUNTER — Telehealth: Payer: Self-pay | Admitting: *Deleted

## 2017-03-21 DIAGNOSIS — E785 Hyperlipidemia, unspecified: Secondary | ICD-10-CM

## 2017-03-21 NOTE — Telephone Encounter (Signed)
-----   Message from Minus Breeding, MD sent at 03/13/2017  5:44 PM EDT ----- His LDL was calculated to be 190.  This is double previous.  I would like him to come back for a direct LDL.  Is he taking his meds?  Call Mr. Mcfadden with the results

## 2017-03-21 NOTE — Telephone Encounter (Addendum)
Pt is aware of blood work, direct LDL ordered for pt to get drawn, pt stated he will come into office on Monday to get blood work done

## 2017-03-25 DIAGNOSIS — E785 Hyperlipidemia, unspecified: Secondary | ICD-10-CM | POA: Diagnosis not present

## 2017-03-25 LAB — LDL CHOLESTEROL, DIRECT: LDL Direct: 136 mg/dL — ABNORMAL HIGH (ref 0–99)

## 2017-04-03 ENCOUNTER — Telehealth: Payer: Self-pay | Admitting: *Deleted

## 2017-04-03 DIAGNOSIS — Z79899 Other long term (current) drug therapy: Secondary | ICD-10-CM

## 2017-04-03 DIAGNOSIS — E785 Hyperlipidemia, unspecified: Secondary | ICD-10-CM

## 2017-04-03 MED ORDER — ROSUVASTATIN CALCIUM 20 MG PO TABS: 20.0000 mg | ORAL_TABLET | Freq: Every day | ORAL | 3 refills | Status: DC

## 2017-04-03 NOTE — Telephone Encounter (Signed)
-----   Message from Minus Breeding, MD sent at 03/30/2017  1:09 PM EDT ----- He is not at target.  LDL was 136.  I would like for him to stop the Pravachol and start Crestor 20 mg daily.  He will likely need 40 mg daily.  Repeat lipid and liver in 8 weeks.  Call Mr. Cuff with the results

## 2017-04-03 NOTE — Telephone Encounter (Signed)
Spoke with pt letting him know to stop his pravastatin and start Crestor 20 mg daily, Fasting Lipids liver was ordered and mail to pt to get done in 8 weeks, pt is aware.

## 2017-04-15 ENCOUNTER — Telehealth: Payer: Self-pay | Admitting: Cardiology

## 2017-04-15 MED ORDER — CHLORTHALIDONE 25 MG PO TABS: 50.0000 mg | ORAL_TABLET | Freq: Every day | ORAL | 1 refills | Status: DC

## 2017-04-15 MED ORDER — NITROGLYCERIN 0.4 MG SL SUBL: 0.4000 mg | SUBLINGUAL_TABLET | SUBLINGUAL | 1 refills | Status: DC | PRN

## 2017-04-15 NOTE — Telephone Encounter (Signed)
°*  STAT* If patient is at the pharmacy, call can be transferred to refill team.   1. Which medications need to be refilled? (please list name of each medication and dose if known) nitroglycerin 0.4mg    2. Which pharmacy/location (including street and city if local pharmacy) is medication to be sent to? [facility] on [address]  3. Do they need a 30 day or 90 day supply? [place]

## 2017-04-17 ENCOUNTER — Other Ambulatory Visit: Payer: Self-pay | Admitting: Cardiology

## 2017-04-18 ENCOUNTER — Other Ambulatory Visit: Payer: Self-pay

## 2017-04-18 NOTE — Telephone Encounter (Signed)
F/u Message   Pt f/u on medication refill .please cal back to discuss

## 2017-04-23 ENCOUNTER — Other Ambulatory Visit: Payer: Self-pay | Admitting: *Deleted

## 2017-04-23 ENCOUNTER — Other Ambulatory Visit: Payer: Self-pay

## 2017-04-23 MED ORDER — NITROGLYCERIN 0.4 MG SL SUBL: 0.4000 mg | SUBLINGUAL_TABLET | SUBLINGUAL | 1 refills | Status: AC | PRN

## 2017-05-12 ENCOUNTER — Other Ambulatory Visit: Payer: Self-pay | Admitting: Cardiology

## 2017-06-17 ENCOUNTER — Other Ambulatory Visit: Payer: Self-pay | Admitting: Cardiology

## 2017-06-18 NOTE — Telephone Encounter (Signed)
REFILL 

## 2017-07-01 DIAGNOSIS — R05 Cough: Secondary | ICD-10-CM | POA: Diagnosis not present

## 2017-07-01 DIAGNOSIS — R6883 Chills (without fever): Secondary | ICD-10-CM | POA: Diagnosis not present

## 2017-07-01 DIAGNOSIS — J069 Acute upper respiratory infection, unspecified: Secondary | ICD-10-CM | POA: Diagnosis not present

## 2017-07-01 DIAGNOSIS — M545 Low back pain: Secondary | ICD-10-CM | POA: Diagnosis not present

## 2017-07-07 DIAGNOSIS — E785 Hyperlipidemia, unspecified: Secondary | ICD-10-CM | POA: Diagnosis not present

## 2017-07-07 DIAGNOSIS — Z79899 Other long term (current) drug therapy: Secondary | ICD-10-CM | POA: Diagnosis not present

## 2017-07-07 LAB — LIPID PANEL
CHOL/HDL RATIO: 3.7 ratio (ref 0.0–5.0)
Cholesterol, Total: 136 mg/dL (ref 100–199)
HDL: 37 mg/dL — AB (ref >39)
LDL Calculated: 75 mg/dL (ref 0–99)
TRIGLYCERIDES: 119 mg/dL (ref 0–149)
VLDL CHOLESTEROL CAL: 24 mg/dL (ref 5–40)

## 2017-07-07 LAB — HEPATIC FUNCTION PANEL
ALK PHOS: 61 IU/L (ref 39–117)
ALT: 32 IU/L (ref 0–44)
AST: 24 IU/L (ref 0–40)
Albumin: 4.7 g/dL (ref 3.6–4.8)
BILIRUBIN, DIRECT: 0.2 mg/dL (ref 0.00–0.40)
Bilirubin Total: 0.6 mg/dL (ref 0.0–1.2)
TOTAL PROTEIN: 7.6 g/dL (ref 6.0–8.5)

## 2017-11-08 ENCOUNTER — Other Ambulatory Visit: Payer: Self-pay | Admitting: Cardiology

## 2017-11-09 ENCOUNTER — Other Ambulatory Visit: Payer: Self-pay | Admitting: Cardiology

## 2017-11-10 NOTE — Telephone Encounter (Signed)
Rx has been sent to the pharmacy electronically. ° °

## 2017-11-24 ENCOUNTER — Other Ambulatory Visit: Payer: Self-pay | Admitting: Cardiology

## 2017-11-24 NOTE — Telephone Encounter (Signed)
Rx sent to pharmacy   

## 2017-12-03 IMAGING — CR DG CHEST 2V
2 series · 2 of 2 positions shown · non-contrast
Comparison: PA and lateral chest x-ray August 07, 2015

CLINICAL DATA: Follow-up from CABG on August 01, 2015; patient
reports shortness of breath with exertion; sensation of popping at
the level of the sternal wires.

EXAM:
CHEST  2 VIEW

[view not recorded (1 of 2)]
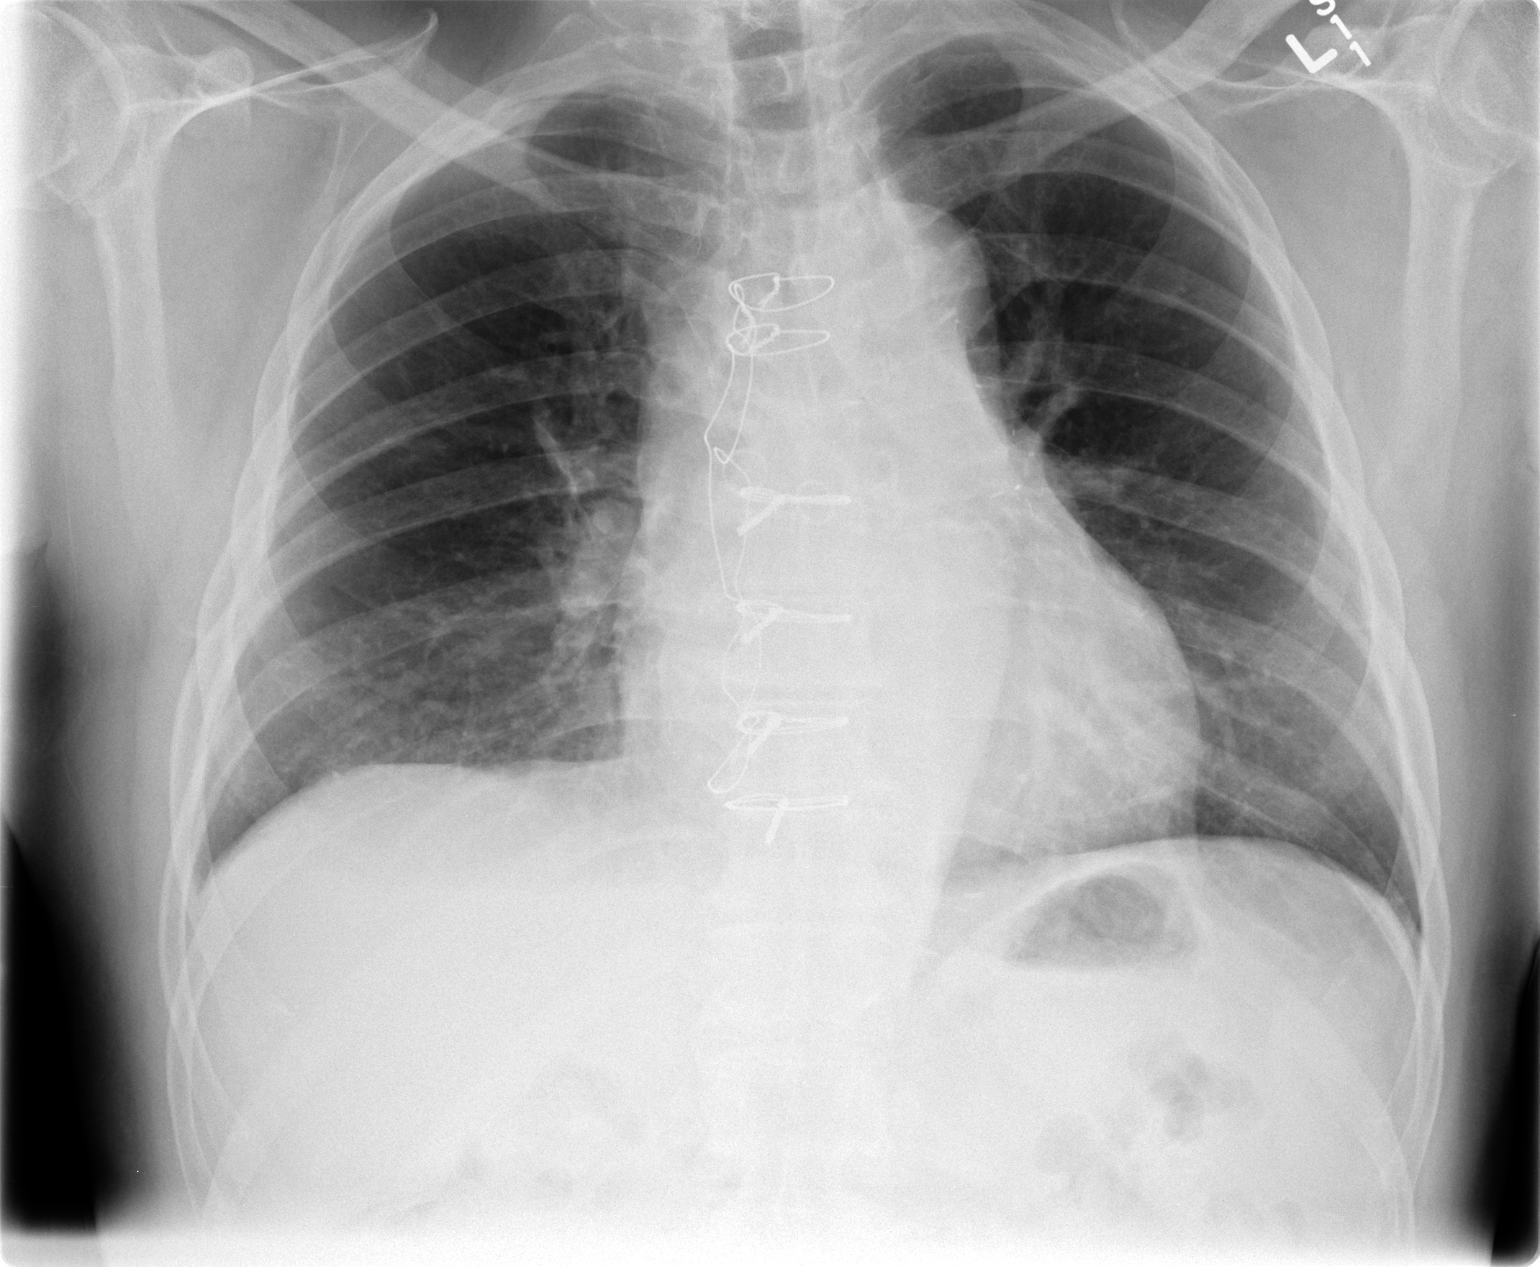

[view not recorded (2 of 2)]
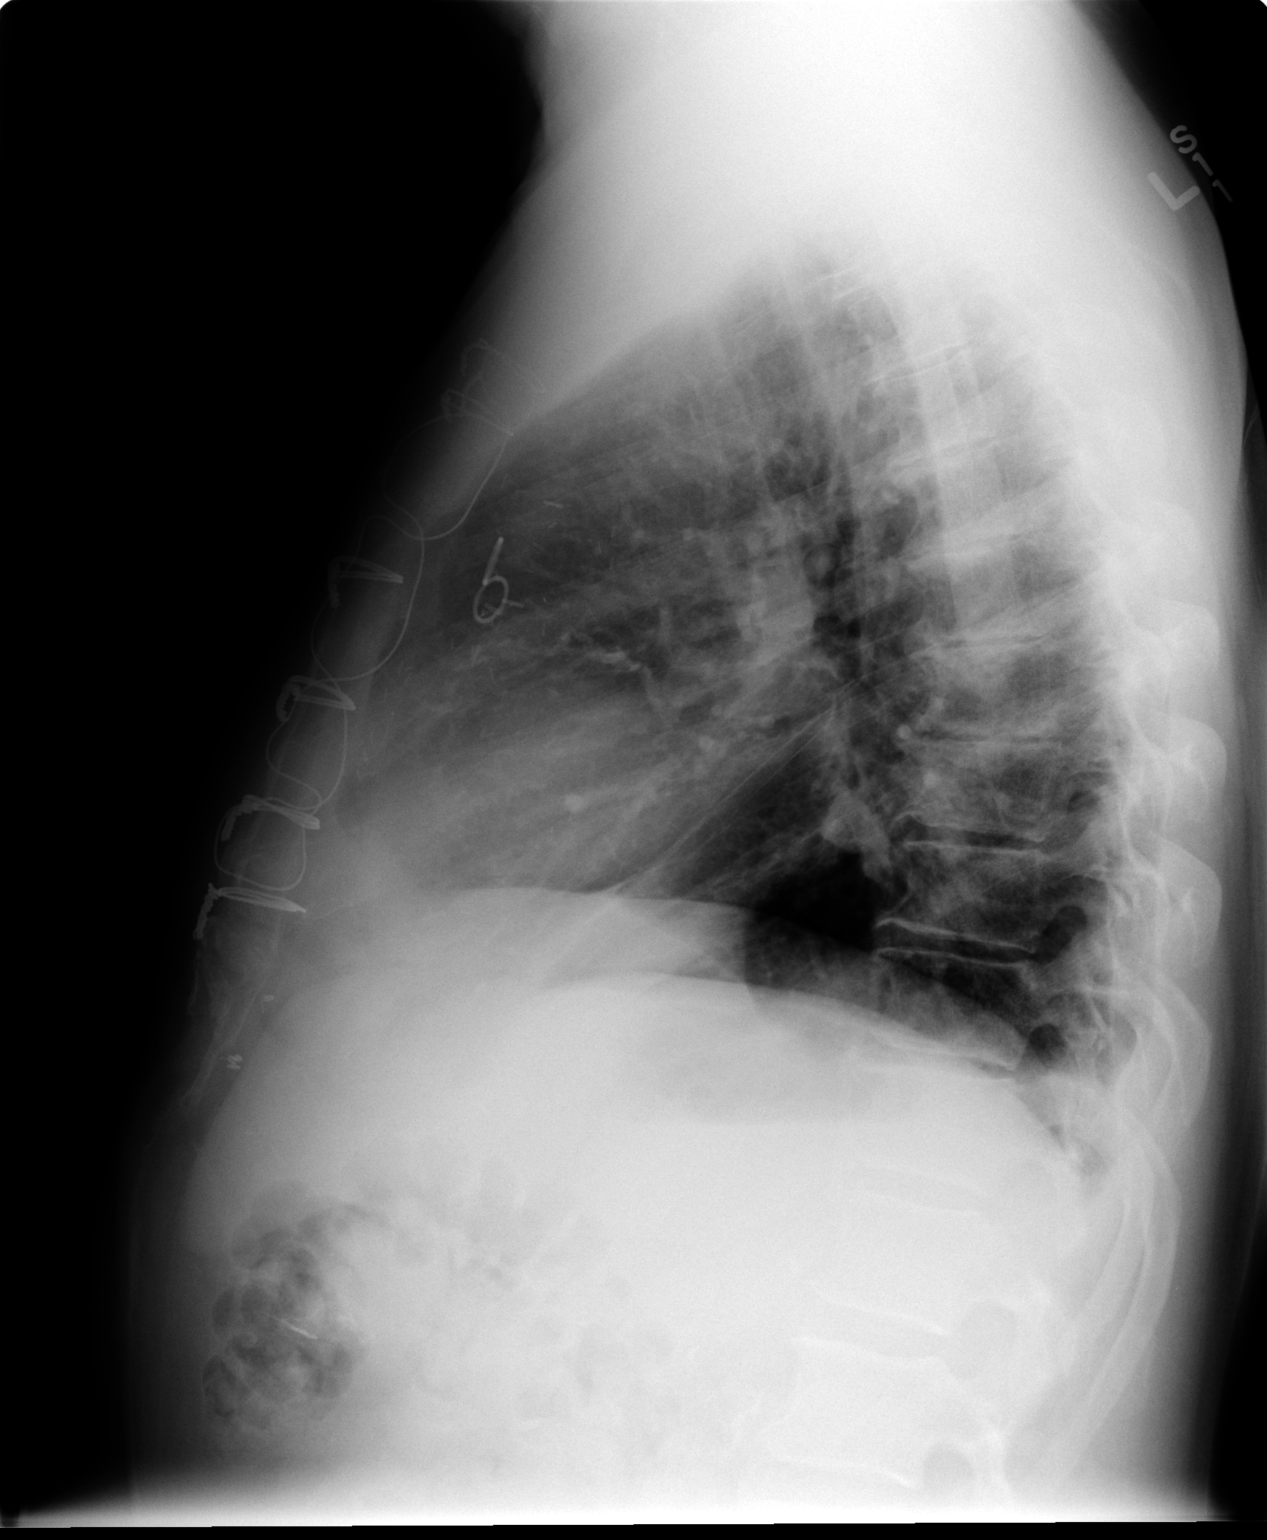

[2 of 2 positions shown; findings below may reference images not displayed]

FINDINGS: The lungs are adequately inflated and clear. There is no pleural
effusion or pneumothorax. The heart is normal in size. The pulmonary
vascularity is not engorged. The sternal wires appear intact. The
retrosternal soft tissues are normal. There is mild multilevel
degenerative disc disease of the thoracic spine.
IMPRESSION: There is no evidence of CHF, pleural effusion, or other
cardiopulmonary abnormality. There are post CABG changes. The
sternal wires appear intact.

## 2017-12-07 NOTE — Progress Notes (Signed)
Cardiology Office Note   Date:  08-14-2017   ID:  Manuel Bell, DOB June 12, 1956, MRN [id number]  PCP:  Patient, No Pcp Per  Cardiologist:   Minus Breeding, MD   Chief Complaint  Patient presents with  . Coronary Artery Disease      History of Present Illness: Manuel Bell who presents for follow-up after redo CABG. He presented in January 2017 with dyspnea and subsequently had cath demonstrating 2 of 5 bypass grafts patent. He underwent redo CABG with a left radial artery to ramus intermediate and SVG to posterior lateral. He did have some postoperative atrial fibrillation.  He returns for follow up.  Since I last saw him he is done well.  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  He does complain of some leg cramping. The patient denies any new symptoms such as chest discomfort, neck or arm discomfort. There has been no new shortness of breath, PND or orthopnea. There have been no reported palpitations, presyncope or syncope.   Past Medical History:  Diagnosis Date  . Alcohol abuse   . Arthritis   . Asthma    as a child   . CAD (coronary artery disease)    LHC 09/11/11: 3 vessel CAD, EF 45-50%  . CARRIER, VIRAL HEPATITIS C 01/28/2007   Qualifier: Diagnosis of  By: Linna Darner MD, Gwyndolyn Saxon    . Complication of anesthesia    slow to awaken  . Coronary atherosclerosis of native coronary artery 10/02/2011  . Dysrhythmia    palpations at times  . ELEVATION, TRANSAMINASE/LDH LEVELS 02/06/2007   Qualifier: Diagnosis of  By: Janelle Floor    . Family history of anesthesia complication   . GERD (gastroesophageal reflux disease)   . Hepatitis C    s/p interferon/[facility] Rx; chronically elevated LFTs. treated 2015- 2016- Completed 07/2014  . Hx of CABG 08/2011   Dr. Prescott Gum - LIMA to LAD, SVG-D1, SVG-distal RCA, SVG-RI/distal circumflex  . HYPERLIPIDEMIA 11/22/2009   Qualifier: Diagnosis of  By: Linna Darner MD, Gwyndolyn Saxon   Pravastatin 40 mg started post  CBAG 08/2011      . HYPERTENSION, ESSENTIAL NOS 08/20/2007   Qualifier: Diagnosis of  By: Linna Darner MD, Gwyndolyn Saxon     . LUMBAR RADICULOPATHY, RIGHT 01/02/2010   Qualifier: Diagnosis of  By: Linna Darner MD, [place] 11/22/2009   Qualifier: Diagnosis of  By: Linna Darner MD, Gwyndolyn Saxon    . Other abnormal glucose 01/07/2012   Fasting blood sugar ranged 103-116 hospitalized March/April 2013.   Marland Kitchen PONV (postoperative nausea and vomiting)   . Postoperative atrial fibrillation ([place]) 08/2011   Amiodarone  . S/P CABG x 5 09/24/2011   09/12/2011     Past Surgical History:  Procedure Laterality Date  . Arthroscopic knee surgery Left    left  . CARDIAC CATHETERIZATION N/A 07/14/2015   Procedure: Left Heart Cath and Coronary Angiography;  Surgeon: Burnell Blanks, MD;  Location: [place] [facility];  Service: Cardiovascular;  Laterality: N/A;  . COLONOSCOPY  2005   negative; [facility]  . CORONARY ARTERY BYPASS GRAFT  09/12/2011   Procedure: CORONARY ARTERY BYPASS GRAFTING (CABG);  Surgeon: Ivin Poot, MD;  Location: [place];  Service: Open Heart Surgery;  Laterality: N/A;  . CORONARY ARTERY BYPASS GRAFT N/A 08/01/2015   Procedure: REDO CORONARY ARTERY BYPASS GRAFTING x two (CABG) x   using left radial artery, and left leg greater  saphenous vein harvested endoscopically, also performed coronary endarderectomy. Placement of Intra aortic balloon pump (IAB).;  Surgeon: Ivin Poot, MD;  Location: [facility];  Service: Open Heart Surgery;  Laterality: N/A;  . ESOPHAGOGASTRODUODENOSCOPY (EGD) WITH PROPOFOL N/A 02/16/2014   Procedure: ESOPHAGOGASTRODUODENOSCOPY (EGD) WITH PROPOFOL;  Surgeon: Winfield Cunas., MD;  Location: [facility];  Service: Endoscopy;  Laterality: N/A;  H&P in file  . ORCHIECTOMY     left for scar tissue post torsion; Dr Hartley Barefoot  . RADIAL ARTERY HARVEST Left 08/01/2015   Procedure: RADIAL ARTERY HARVEST;  Surgeon: Ivin Poot, MD;  Location: [place];  Service: Open Heart Surgery;   Laterality: Left;  . TEE WITHOUT CARDIOVERSION N/A 08/01/2015   Procedure: TRANSESOPHAGEAL ECHOCARDIOGRAM (TEE);  Surgeon: Ivin Poot, MD;  Location: [place];  Service: Open Heart Surgery;  Laterality: N/A;  . Tympanic membranes Left    perforation X 3 of  L  TM  ;surgically repaired     Current Outpatient Medications  Medication Sig Dispense Refill  . ALPRAZolam (XANAX) 0.5 MG tablet Take 0.5 tablets (0.25 mg total) by mouth at bedtime as needed. 30 tablet 0  . aspirin 81 MG tablet Take 81 mg by mouth daily.    . chlorthalidone (HYGROTON) 25 MG tablet TAKE 2 TABLETS(50 MG) BY MOUTH DAILY (Patient taking differently: TAKE 1 TABLET (25 MG) BY MOUTH DAILY) 180 tablet 1  . lansoprazole (PREVACID) 30 MG capsule TAKE 1 CAPSULE DAILY 90 capsule 0  . lisinopril (PRINIVIL,ZESTRIL) 20 MG tablet TAKE 1 TABLET TWICE A DAY 180 tablet 0  . metoprolol tartrate (LOPRESSOR) 25 MG tablet TAKE 1 TABLET TWICE A DAY 180 tablet 3  . nitroGLYCERIN (NITROSTAT) 0.4 MG SL tablet Place 1 tablet (0.4 mg total) under the tongue every 5 (five) minutes as needed for chest pain. Future cardiac medication refills with PCP 25 tablet 1  . Omega-3 Fatty Acids (FISH OIL) 1200 MG CAPS Take 1,200 mg by mouth 2 (two) times daily.     Marland Kitchen zolpidem (AMBIEN) 10 MG tablet Take 1 tablet (10 mg total) by mouth at bedtime. 30 tablet 0  . rosuvastatin (CRESTOR) 20 MG tablet Take 1 tablet (20 mg total) by mouth daily. 90 tablet 3   No current facility-administered medications for this visit.     Allergies:   Sulfa antibiotics and Tape    ROS:  Please see the history of present illness.   Otherwise, review of systems are positive for back pain, insomnia, right foot drop.   All other systems are reviewed and negative.    PHYSICAL EXAM: VS:  BP 126/74 (BP Location: Left Arm, Patient Position: Sitting, Cuff Size: Normal)   Pulse 71   Ht 5\' 9"  (1.753 m)   Wt 190 lb (86.2 kg)   BMI 28.06 kg/m  , BMI Body mass index is 28.06 kg/m.    GENERAL:  Well appearing NECK:  No jugular venous distention, waveform within normal limits, carotid upstroke brisk and symmetric, no bruits, no thyromegaly LUNGS:  Clear to auscultation bilaterally CHEST:  Well healed sternotomy scar. HEART:  PMI not displaced or sustained,S1 and S2 within normal limits, no S3, no S4, no clicks, no rubs, no murmurs ABD:  Flat, positive bowel sounds normal in frequency in pitch, no bruits, no rebound, no guarding, no midline pulsatile mass, no hepatomegaly, no splenomegaly EXT:  2 plus pulses throughout, no edema, no cyanosis no clubbing     EKG:  EKG is sinus rhythm, rate  71 ordered ., Incomplete right bundle branch block, no acute ST-T wave changes.  No change from previous.   Recent Labs: 07/07/2017: ALT 32    Lipid Panel    Component Value Date/Time   CHOL 136 07/07/2017 0938   CHOL 173 10/26/2015 0802   TRIG 119 07/07/2017 0938   TRIG 81 10/26/2015 0802   HDL 37 (L) 07/07/2017 0938   HDL 62 10/26/2015 0802   CHOLHDL 3.7 07/07/2017 0938   CHOLHDL 4.0 03/12/2017 0922   VLDL 11 08/20/2016 0933   LDLCALC 75 07/07/2017 0938   LDLCALC 190 (H) 03/12/2017 0922   LDLCALC 95 10/26/2015 0802   LDLDIRECT 136 (H) 03/25/2017 0816   LDLDIRECT 146.5 11/29/2009 0839      Wt Readings from Last 3 Encounters:  12/08/17 190 lb (86.2 kg)  11/07/16 179 lb 3.2 oz (81.3 kg)  04/16/16 187 lb 6.4 oz (85 kg)      Other studies Reviewed: Additional studies/ records that were reviewed today include: [place]. Review of the above records demonstrates:  See elsewhere  ASSESSMENT AND PLAN:   S/P CABG x 5 2013 with re CABG x 27 Jul 2015:    The patient has no new sypmtoms.  No further cardiovascular testing is indicated.  We will continue with aggressive risk reduction and meds as listed.  Essential hypertension  The blood pressure is at target.  No change in therapy.   Hyperlipidemia:    LDL was 75.  He does have some muscle cramping which could be related to  his statin.  I suggested possibly coenzyme Q.  However, he chooses to remain on the meds as listed and I agree with this.  I will check a basic metabolic profile and magnesium.  Current medicines are reviewed at length with the patient today.  The patient does not have concerns regarding medicines.  The following changes have been made:  None  Labs/ tests ordered today include:    Orders Placed This Encounter  Procedures  . Basic Metabolic Panel (BMET)  . Magnesium  . EKG 12-Lead     Disposition:   FU with me in 12 months.     Signed, Minus Breeding, MD  July 22, 202019 3:27 PM    [facility]

## 2017-12-08 ENCOUNTER — Encounter: Payer: Self-pay | Admitting: Cardiology

## 2017-12-08 ENCOUNTER — Ambulatory Visit: Payer: BLUE CROSS/BLUE SHIELD | Admitting: Cardiology

## 2017-12-08 VITALS — BP 126/74 | HR 71 | Ht 69.0 in | Wt 190.0 lb

## 2017-12-08 DIAGNOSIS — Z951 Presence of aortocoronary bypass graft: Secondary | ICD-10-CM

## 2017-12-08 DIAGNOSIS — I2510 Atherosclerotic heart disease of native coronary artery without angina pectoris: Secondary | ICD-10-CM | POA: Diagnosis not present

## 2017-12-08 DIAGNOSIS — Z79899 Other long term (current) drug therapy: Secondary | ICD-10-CM

## 2017-12-08 DIAGNOSIS — R252 Cramp and spasm: Secondary | ICD-10-CM

## 2017-12-08 NOTE — Patient Instructions (Signed)
Medication Instructions:  Continue current medications  If you need a refill on your cardiac medications before your next appointment, please call your pharmacy.  Labwork: BMP and Magnesium HERE IN OUR OFFICE AT [facility]  Take the provided lab slips with you to the lab for your blood draw.   You will NOT need to fast   Testing/Procedures: None Ordered  Special Instructions:  CO Q 10  Follow-Up: Your physician wants you to follow-up in: 1 Year. You should receive a reminder letter in the mail two months in advance. If you do not receive a letter, please call our office [phone].      Thank you for choosing [facility] at [facility]!!

## 2017-12-09 LAB — BASIC METABOLIC PANEL
BUN/Creatinine Ratio: 24 (ref 10–24)
BUN: 21 mg/dL (ref 8–27)
CHLORIDE: 97 mmol/L (ref 96–106)
CO2: 27 mmol/L (ref 20–29)
Calcium: 9.4 mg/dL (ref 8.6–10.2)
Creatinine, Ser: 0.86 mg/dL (ref 0.76–1.27)
GFR calc Af Amer: 106 mL/min/{1.73_m2} (ref >59)
GFR calc non Af Amer: 92 mL/min/{1.73_m2} (ref >59)
GLUCOSE: 93 mg/dL (ref 65–99)
Potassium: 4.1 mmol/L (ref 3.5–5.2)
SODIUM: 139 mmol/L (ref 134–144)

## 2017-12-09 LAB — MAGNESIUM: MAGNESIUM: 2 mg/dL (ref 1.6–2.3)

## 2018-01-27 IMAGING — US US ABDOMEN LIMITED
1 series · 14 of 25 positions shown · non-contrast
Comparison: 05/15/2015

CLINICAL DATA: Hepatitis-C status post treatment with Harvoni

EXAM:
US ABDOMEN LIMITED - RIGHT UPPER QUADRANT

[Series 1: us abdomen limited · 0.19mm/px · 14 of 42 slices shown]
[im 1/42]
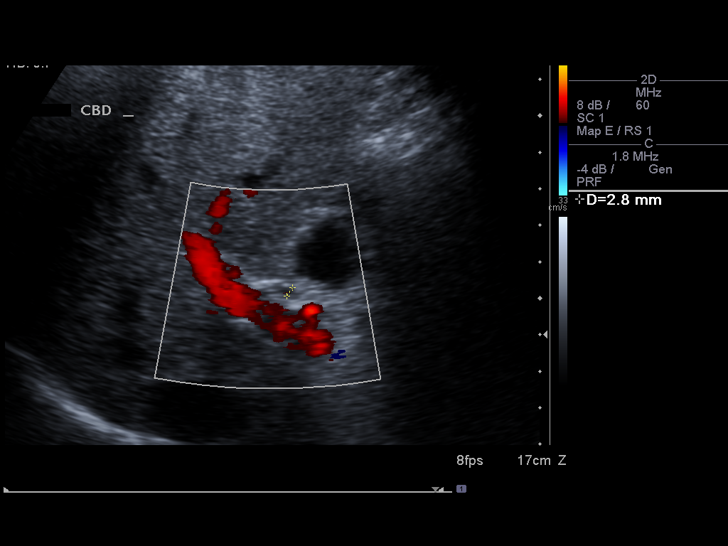
[im 4/42]
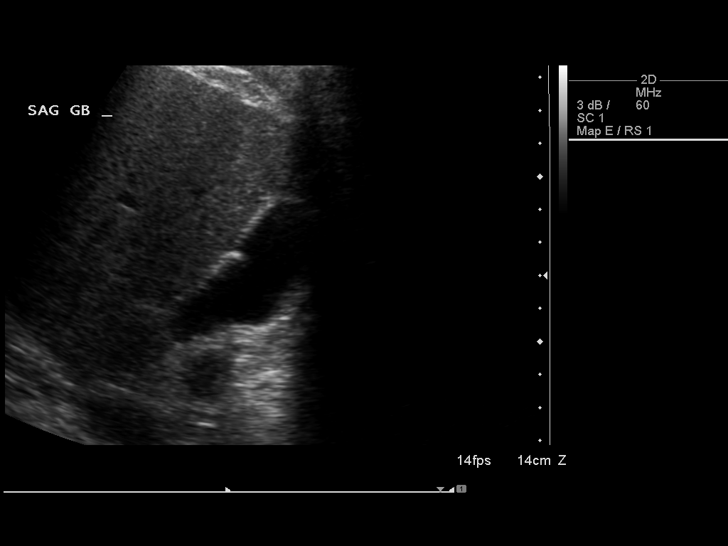
[im 7/42]
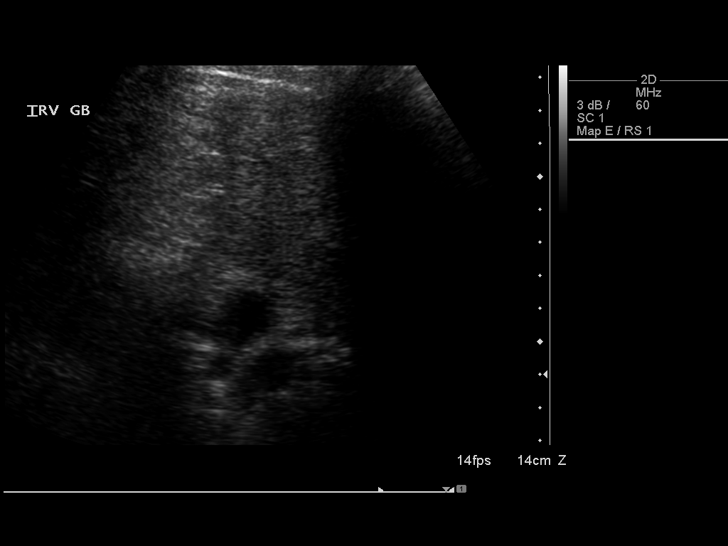
[im 11/42]
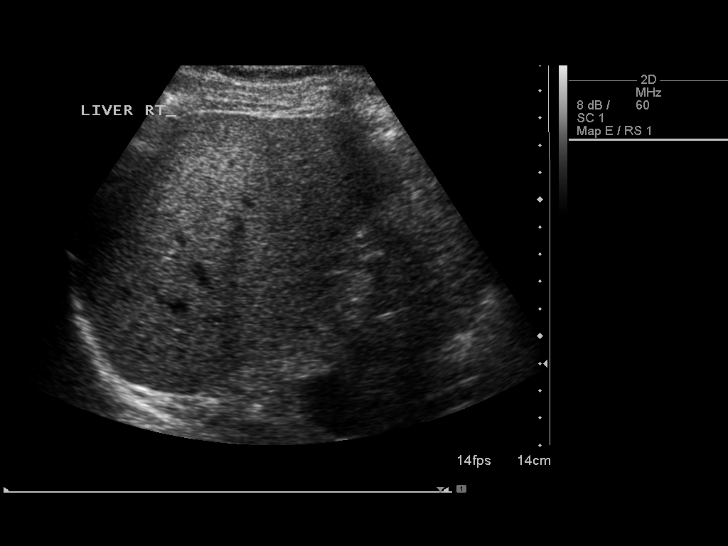
[im 14/42]
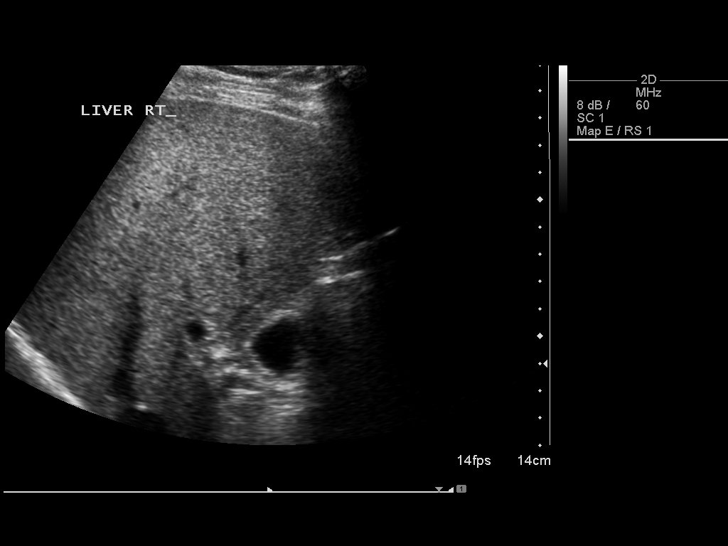
[im 16/42]
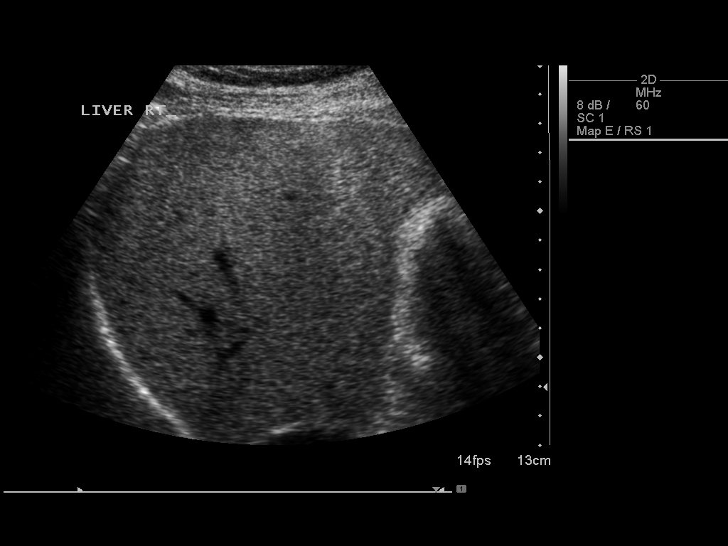
[im 19/42]
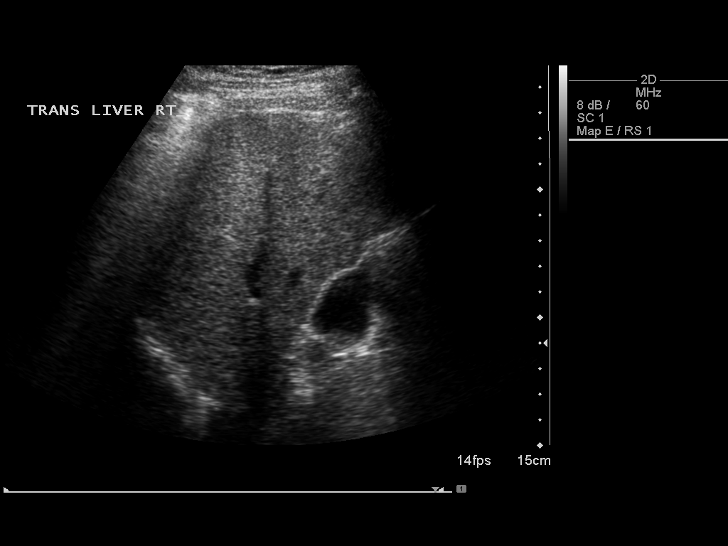
[im 23/42]
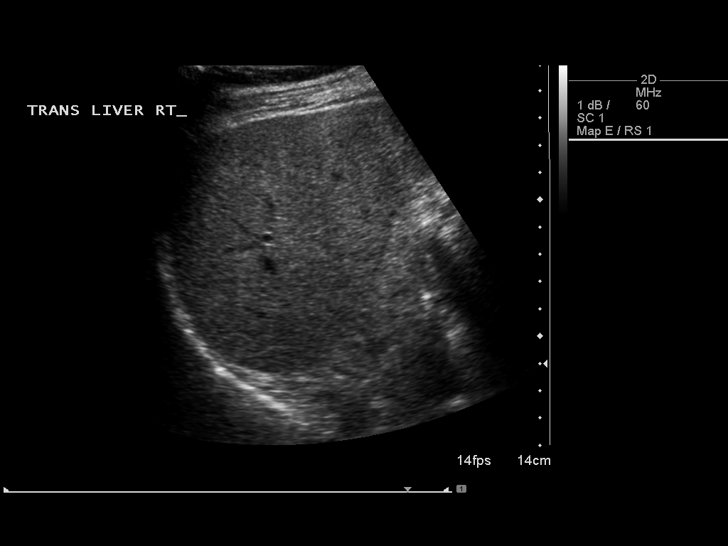
[im 26/42]
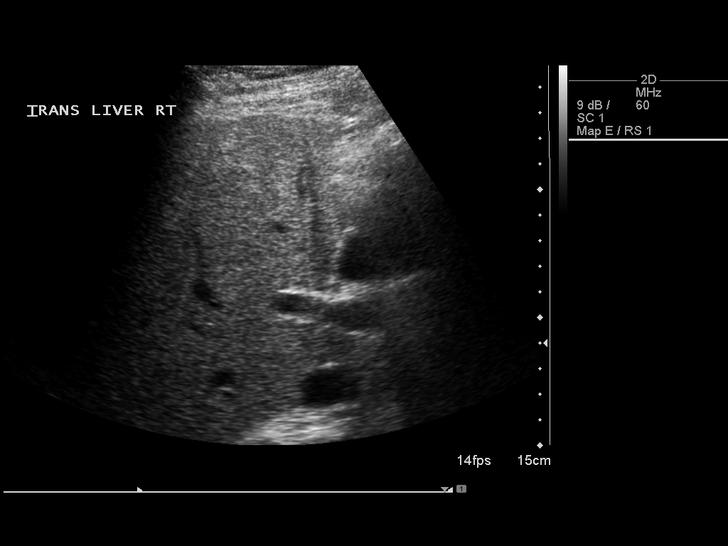
[im 28/42]
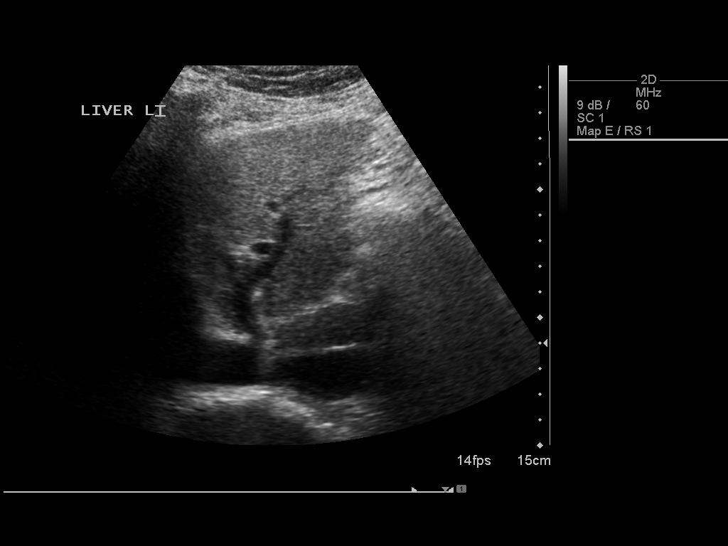
[im 31/42]
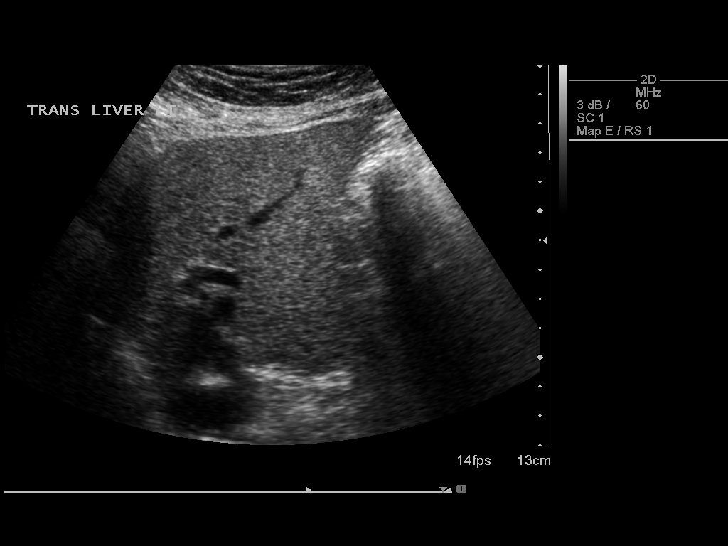
[im 35/42]
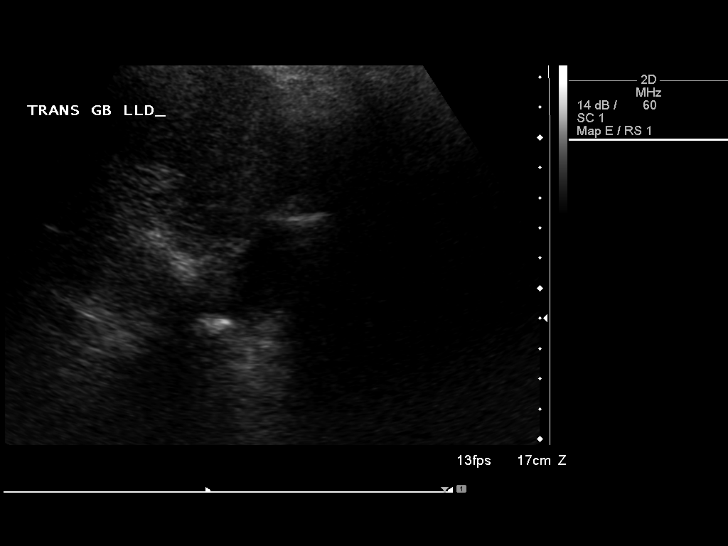
[im 38/42]
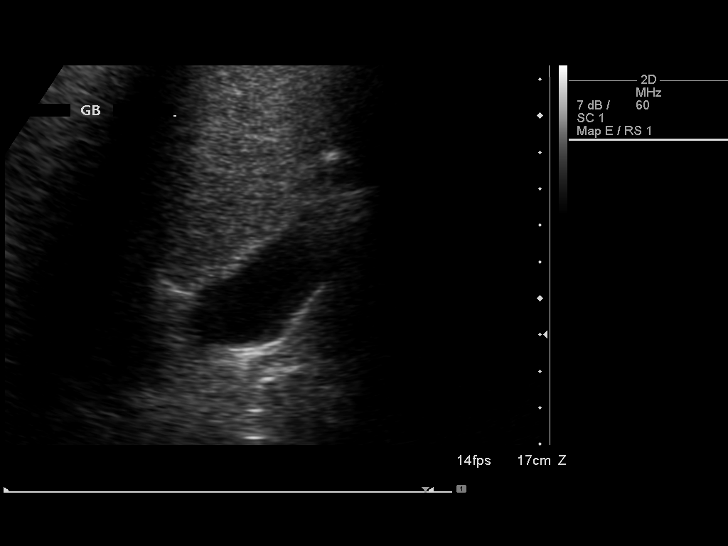
[im 42/42]
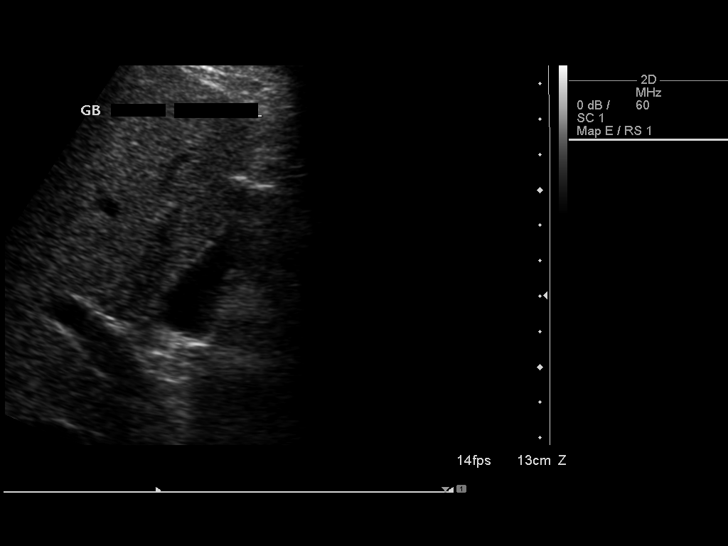

[14 of 25 positions shown; findings below may reference images not displayed]

FINDINGS: Gallbladder:

No gallstones or wall thickening visualized. No sonographic Murphy
sign noted by sonographer.

Common bile duct:

Diameter: 2.8 mm.

Liver:

Mildly heterogeneous without focal mass lesion.
IMPRESSION: Mild heterogeneity to the liver.  No focal mass lesion is noted.

## 2018-02-08 ENCOUNTER — Other Ambulatory Visit: Payer: Self-pay | Admitting: Cardiology

## 2018-02-11 ENCOUNTER — Telehealth: Payer: Self-pay | Admitting: Cardiology

## 2018-02-11 DIAGNOSIS — H6123 Impacted cerumen, bilateral: Secondary | ICD-10-CM | POA: Diagnosis not present

## 2018-02-11 NOTE — Telephone Encounter (Signed)
New message   Patient is having leg and hand cramps. Patient is not sure what medications this may be coming from. Please advise.

## 2018-02-11 NOTE — Telephone Encounter (Signed)
Stop what?

## 2018-02-11 NOTE — Telephone Encounter (Signed)
Pt states that he is having leg and hands it is  worse at night he will stop for 2 weeks and see if it [employer]

## 2018-02-12 NOTE — Telephone Encounter (Signed)
Pt is stopping rosuvastatin for 2 weeks to see if this is causing the cramping.

## 2018-02-22 ENCOUNTER — Other Ambulatory Visit: Payer: Self-pay | Admitting: Cardiology

## 2018-02-25 NOTE — Telephone Encounter (Signed)
Rx request sent to pharmacy.  

## 2018-02-26 ENCOUNTER — Telehealth: Payer: Self-pay | Admitting: Cardiology

## 2018-02-26 NOTE — Telephone Encounter (Signed)
New Message:      Patient has questions about his medication :     chlorthalidone (HYGROTON) 25 MG tablet

## 2018-02-26 NOTE — Telephone Encounter (Signed)
Spoke with patient of Dr. Percival Spanish who reports he thinks he is out of chlorthalidone. He states he does not have an empty bottle b/c if he did, he would've requested a refill. He is not sure how long he has been off the medication. Explained that at his last visit in June it was noted by the rooming MA during med review that he was taking only 1 tablet QD instead of 2 tablets as on the prescription and that MD made no med changes. He does not recall this. Patient does not routinely check his BP at home.  Suggested that he monitor home BP twice daily for about 1 week and call office with readings or send via MyChart for MD to review since he is off this medication for an unspecified amount of time.   Routed to MD as Juluis Rainier

## 2018-02-26 NOTE — Telephone Encounter (Signed)
LMTCB

## 2018-03-16 DIAGNOSIS — D044 Carcinoma in situ of skin of scalp and neck: Secondary | ICD-10-CM | POA: Diagnosis not present

## 2018-03-16 DIAGNOSIS — L821 Other seborrheic keratosis: Secondary | ICD-10-CM | POA: Diagnosis not present

## 2018-03-16 DIAGNOSIS — L814 Other melanin hyperpigmentation: Secondary | ICD-10-CM | POA: Diagnosis not present

## 2018-03-16 DIAGNOSIS — C4441 Basal cell carcinoma of skin of scalp and neck: Secondary | ICD-10-CM | POA: Diagnosis not present

## 2018-03-16 DIAGNOSIS — L570 Actinic keratosis: Secondary | ICD-10-CM | POA: Diagnosis not present

## 2018-03-24 ENCOUNTER — Other Ambulatory Visit: Payer: Self-pay | Admitting: Cardiology

## 2018-04-27 DIAGNOSIS — C4441 Basal cell carcinoma of skin of scalp and neck: Secondary | ICD-10-CM | POA: Diagnosis not present

## 2018-05-26 ENCOUNTER — Telehealth: Payer: Self-pay

## 2018-05-26 NOTE — Telephone Encounter (Signed)
Okay to schedule an office visit at his convenience

## 2018-05-26 NOTE — Telephone Encounter (Signed)
Okay to schedule NP appt at his convenience. Thank you.

## 2018-05-26 NOTE — Telephone Encounter (Signed)
Copied from [place] [id number]. Topic: Appointment Scheduling - Prior Auth Required for Appointment >> May 26, 2018  8:51 AM Judyann Munson wrote: No appointment has been scheduled. Patient is requesting a new patient  appointment with Dr.Paz , the patient stated he has seen Dr.paz six years ago and would like to establish care with him.  Per scheduling protocol, this appointment requires a prior authorization prior to scheduling.  Route to department's PEC pool.

## 2018-05-27 NOTE — Telephone Encounter (Signed)
Called pt and scheduled NP appt for 07/01/18

## 2018-07-01 ENCOUNTER — Ambulatory Visit: Payer: BLUE CROSS/BLUE SHIELD | Admitting: Internal Medicine

## 2018-07-08 ENCOUNTER — Ambulatory Visit: Payer: BLUE CROSS/BLUE SHIELD | Admitting: Internal Medicine

## 2018-07-08 ENCOUNTER — Encounter: Payer: Self-pay | Admitting: Internal Medicine

## 2018-07-08 VITALS — BP 126/80 | HR 71 | Temp 98.6°F | Resp 16 | Ht 69.0 in | Wt 185.5 lb

## 2018-07-08 DIAGNOSIS — E785 Hyperlipidemia, unspecified: Secondary | ICD-10-CM

## 2018-07-08 DIAGNOSIS — Z125 Encounter for screening for malignant neoplasm of prostate: Secondary | ICD-10-CM

## 2018-07-08 DIAGNOSIS — Z23 Encounter for immunization: Secondary | ICD-10-CM | POA: Diagnosis not present

## 2018-07-08 DIAGNOSIS — I10 Essential (primary) hypertension: Secondary | ICD-10-CM

## 2018-07-08 DIAGNOSIS — G4700 Insomnia, unspecified: Secondary | ICD-10-CM

## 2018-07-08 DIAGNOSIS — I2510 Atherosclerotic heart disease of native coronary artery without angina pectoris: Secondary | ICD-10-CM

## 2018-07-08 MED ORDER — ZOLPIDEM TARTRATE 10 MG PO TABS: 5.0000 mg | ORAL_TABLET | Freq: Every day | ORAL | 1 refills | Status: AC

## 2018-07-08 NOTE — Patient Instructions (Signed)
GO TO THE LAB : Get the blood work     GO TO THE FRONT DESK Schedule your next appointment for a  physical exam in 6 months  

## 2018-07-08 NOTE — Progress Notes (Signed)
Subjective:    Patient ID: Manuel Bell, male    DOB: 1955/10/09, 63 y.o.   MRN: [id number]  DOS:  07/08/2018 Type of visit - description -----------New patient, last visit 2013, referred by cardiology Since the last office visit, had a CABG x2. From the cardiac standpoint, he is stable was recommended by cardiology to establish with a primary doctor. History of EtOH: Currently drinking in moderation. Chronic low back pain: Not an issue at this time, pain is manageable. Insomnia: Having problems on and off, has tried Ambien and Xanax before, both worked for him.   Review of Systems Denies chest pain or difficulty breathing No nausea, vomiting, diarrhea No dysuria, gross hematuria difficulty urinating  Past Medical History:  Diagnosis Date  . Alcohol abuse   . Arthritis   . Asthma    as a child   . CAD (coronary artery disease)    LHC 09/11/11: 3 vessel CAD, EF 45-50%  . CARRIER, VIRAL HEPATITIS C 01/28/2007   Qualifier: Diagnosis of  By: Linna Darner MD, Gwyndolyn Saxon    . Complication of anesthesia    slow to awaken  . Coronary atherosclerosis of native coronary artery 10/02/2011  . Dysrhythmia    palpations at times  . ELEVATION, TRANSAMINASE/LDH LEVELS 02/06/2007   Qualifier: Diagnosis of  By: Janelle Floor    . Family history of anesthesia complication   . GERD (gastroesophageal reflux disease)   . Hepatitis C    s/p interferon/[facility] Rx; chronically elevated LFTs. treated 2015- 2016- Completed 07/2014  . Hx of CABG 08/2011   Dr. Prescott Gum - LIMA to LAD, SVG-D1, SVG-distal RCA, SVG-RI/distal circumflex  . HYPERLIPIDEMIA 11/22/2009   Qualifier: Diagnosis of  By: Linna Darner MD, Gwyndolyn Saxon   Pravastatin 40 mg started post CBAG 08/2011      . HYPERTENSION, ESSENTIAL NOS 08/20/2007   Qualifier: Diagnosis of  By: Linna Darner MD, Gwyndolyn Saxon     . LUMBAR RADICULOPATHY, RIGHT 01/02/2010   Qualifier: Diagnosis of  By: Linna Darner MD, Gwyndolyn Saxon    . NEURALGIA 11/22/2009   Qualifier: Diagnosis of  By: Linna Darner MD,  Gwyndolyn Saxon    . PONV (postoperative nausea and vomiting)   . Postoperative atrial fibrillation ([place]) 08/2011   Amiodarone  . S/P CABG x 5 09/24/2011   09/12/2011     Past Surgical History:  Procedure Laterality Date  . Arthroscopic knee surgery Left    left  . CARDIAC CATHETERIZATION N/A 07/14/2015   Procedure: Left Heart Cath and Coronary Angiography;  Surgeon: Burnell Blanks, MD;  Location: [place] [facility];  Service: Cardiovascular;  Laterality: N/A;  . COLONOSCOPY  2005   negative; [facility]  . CORONARY ARTERY BYPASS GRAFT  09/12/2011   Procedure: CORONARY ARTERY BYPASS GRAFTING (CABG);  Surgeon: Ivin Poot, MD;  Location: [place];  Service: Open Heart Surgery;  Laterality: N/A;  . CORONARY ARTERY BYPASS GRAFT N/A 08/01/2015   Procedure: REDO CORONARY ARTERY BYPASS GRAFTING x two (CABG) x   using left radial artery, and left leg greater saphenous vein harvested endoscopically, also performed coronary endarderectomy. Placement of Intra aortic balloon pump (IAB).;  Surgeon: Ivin Poot, MD;  Location: [place];  Service: Open Heart Surgery;  Laterality: N/A;  . ESOPHAGOGASTRODUODENOSCOPY (EGD) WITH PROPOFOL N/A 02/16/2014   Procedure: ESOPHAGOGASTRODUODENOSCOPY (EGD) WITH PROPOFOL;  Surgeon: Winfield Cunas., MD;  Location[facility];  Service: Endoscopy;  Laterality: N/A;  H&P in file  . ORCHIECTOMY     left for scar tissue post  torsion; Dr Hartley Barefoot  . RADIAL ARTERY HARVEST Left 08/01/2015   Procedure: RADIAL ARTERY HARVEST;  Surgeon: Ivin Poot, MD;  Location: [place];  Service: Open Heart Surgery;  Laterality: Left;  . TEE WITHOUT CARDIOVERSION N/A 08/01/2015   Procedure: TRANSESOPHAGEAL ECHOCARDIOGRAM (TEE);  Surgeon: Ivin Poot, MD;  Location: [place];  Service: Open Heart Surgery;  Laterality: N/A;  . Tympanic membranes Left    perforation X 3 of  L  TM  ;surgically repaired    Social History   Socioeconomic History  . Marital status: Single    Spouse name: Not  on file  . Number of children: 0  . Years of education: Not on file  . Highest education level: Not on file  Occupational History  . Occupation: Press photographer, power tools   Social Needs  . Financial resource strain: Not on file  . Food insecurity:    Worry: Not on file    Inability: Not on file  . Transportation needs:    Medical: Not on file    Non-medical: Not on file  Tobacco Use  . Smoking status: Former Smoker    Packs/day: 1.00    Years: 30.00    Pack years: 30.00    Types: Cigarettes    Last attempt to quit: 09/10/2003    Years since quitting: 14.8  . Smokeless tobacco: Never Used  . Tobacco comment: smoked 1971- 2005 (off intermittently 1.5 years), up to 1 ppd   Substance and Sexual Activity  . Alcohol use: Yes    Alcohol/week: 10.0 standard drinks    Types: 10 Glasses of wine per week    Comment: used to abuse, as off 06/2018 in moderation  . Drug use: Yes    Types: Marijuana    Comment:    . Sexual activity: Not on file    Comment: twice a week  Lifestyle  . Physical activity:    Days per week: Not on file    Minutes per session: Not on file  . Stress: Not on file  Relationships  . Social connections:    Talks on phone: Not on file    Gets together: Not on file    Attends religious service: Not on file    Active member of club or organization: Not on file    Attends meetings of clubs or organizations: Not on file    Relationship status: Not on file  . Intimate partner violence:    Fear of current or ex partner: Not on file    Emotionally abused: Not on file    Physically abused: Not on file    Forced sexual activity: Not on file  Other Topics Concern  . Not on file  Social History Narrative   Lives alone.   Sister and nephew live in the area     Family History  Problem Relation Age of Onset  . Heart attack Maternal Grandfather 54  . Lung cancer Mother 66  . Hyperlipidemia Sister   . Diabetes Neg Hx   . Stroke Neg Hx   . Colon cancer Neg Hx   .  Prostate cancer Neg Hx      Allergies as of 07/08/2018      Reactions   Sulfa Antibiotics Anxiety   Feels like his head was going to pop off   Tape Other (See Comments)   Bruising, Please use "paper" tape only.      Medication List       Accurate as  of July 08, 2018 11:59 PM. Always use your most recent med list.        aspirin 81 MG tablet Take 81 mg by mouth daily.   chlorthalidone 25 MG tablet Commonly known as:  HYGROTON TAKE 1 TABLET (25 MG) BY MOUTH DAILY   Fish Oil 1200 MG Caps Take 1,200 mg by mouth 2 (two) times daily.   lansoprazole 30 MG capsule Commonly known as:  PREVACID TAKE 1 CAPSULE DAILY   lisinopril 20 MG tablet Commonly known as:  PRINIVIL,ZESTRIL TAKE 1 TABLET TWICE A DAY   metoprolol tartrate 25 MG tablet Commonly known as:  LOPRESSOR TAKE 1 TABLET TWICE A DAY   nitroGLYCERIN 0.4 MG SL tablet Commonly known as:  NITROSTAT Place 1 tablet (0.4 mg total) under the tongue every 5 (five) minutes as needed for chest pain. Future cardiac medication refills with PCP   Potassium 99 MG Tabs Take 99 mg by mouth 3 (three) times a week.   rosuvastatin 20 MG tablet Commonly known as:  CRESTOR TAKE 1 TABLET(20 MG) BY MOUTH DAILY   zolpidem 10 MG tablet Commonly known as:  AMBIEN Take 0.5-1 tablets (5-10 mg total) by mouth at bedtime.         Objective:   Physical Exam BP 126/80 (BP Location: Left Arm, Patient Position: Sitting, Cuff Size: Normal)   Pulse 71   Temp 98.6 F (37 C) (Oral)   Resp 16   Ht 5\' 9"  (1.753 m)   Wt 185 lb 8 oz (84.1 kg)   SpO2 98%   BMI 27.39 kg/m  General:   Well developed, NAD, BMI noted.  HEENT:  Normocephalic . Face symmetric, atraumatic Lungs:  CTA B Normal respiratory effort, no intercostal retractions, no accessory muscle use. Heart: RRR,  no murmur.  no pretibial edema bilaterally  Abdomen:  Not distended, soft, non-tender. No rebound or rigidity.   Skin: Not pale. Not jaundice Neurologic:    alert & oriented X3.  Speech normal, gait appropriate for age and unassisted Psych--  Cognition and judgment appear intact.  Cooperative with normal attention span and concentration.  Behavior appropriate. No anxious or depressed appearing.     Assessment    Assessment HTN Hyperlipidemia Insomnia CAD: CABG 2013, redo CABG 2017 Postop atrial fibrillation 2013 Low back pain, chronic GERD, used to see Dr. Oletta Lamas History of hepatitis C, s/p treatment, declared cured ~ 2015 EtOH  PLAN: [id number] on chlorthalidone, lisinopril, metoprolol.  Seems well controlled.  Check a CMP, CBC High cholesterol: On Crestor, check a FLP CAD: Asymptomatic Insomnia: Has tried successfully Xanax and Ambien, explained patient that Xanax as a potential to be addictive so we agreed with Ambien 5 to 10 mg at nighttime.  Prescription sent. EtOH: Reports he is drinking in moderation Preventive care: Chart reviewed: Td 2011, Zostavax 2013, PNM 13 today, had a flu shot S/p colonoscopy 2018. Prostate cancer screening: Since we are doing blood work, will check a PSA RTC CPX 4 to 6 months  Today, I spent more than 30 min with the patient: >50% of the time counseling regards insomnia treatment, options, the fact that Xanax has a potential to be abused. Also: -reviewing the chart and labs ordered by other providers

## 2018-07-08 NOTE — Progress Notes (Signed)
Pre visit review using our clinic review tool, if applicable. No additional management support is needed unless otherwise documented below in the visit note. 

## 2018-07-09 DIAGNOSIS — G4700 Insomnia, unspecified: Secondary | ICD-10-CM | POA: Insufficient documentation

## 2018-07-09 DIAGNOSIS — Z09 Encounter for follow-up examination after completed treatment for conditions other than malignant neoplasm: Secondary | ICD-10-CM | POA: Insufficient documentation

## 2018-07-09 LAB — CBC WITH DIFFERENTIAL/PLATELET
BASOS ABS: 0.1 10*3/uL (ref 0.0–0.1)
Basophils Relative: 1.1 % (ref 0.0–3.0)
EOS PCT: 9.8 % — AB (ref 0.0–5.0)
Eosinophils Absolute: 0.7 10*3/uL (ref 0.0–0.7)
HEMATOCRIT: 43.1 % (ref 39.0–52.0)
HEMOGLOBIN: 14.9 g/dL (ref 13.0–17.0)
LYMPHS ABS: 1.5 10*3/uL (ref 0.7–4.0)
LYMPHS PCT: 22.4 % (ref 12.0–46.0)
MCHC: 34.7 g/dL (ref 30.0–36.0)
MCV: 98.7 fl (ref 78.0–100.0)
MONOS PCT: 10 % (ref 3.0–12.0)
Monocytes Absolute: 0.7 10*3/uL (ref 0.1–1.0)
NEUTROS PCT: 56.7 % (ref 43.0–77.0)
Neutro Abs: 3.8 10*3/uL (ref 1.4–7.7)
Platelets: 154 10*3/uL (ref 150.0–400.0)
RBC: 4.36 Mil/uL (ref 4.22–5.81)
RDW: 12.6 % (ref 11.5–15.5)
WBC: 6.7 10*3/uL (ref 4.0–10.5)

## 2018-07-09 LAB — LIPID PANEL
CHOL/HDL RATIO: 3
Cholesterol: 177 mg/dL (ref 0–200)
HDL: 64.7 mg/dL (ref >39.00)
LDL Cholesterol: 94 mg/dL (ref 0–99)
NONHDL: 112.37
Triglycerides: 90 mg/dL (ref 0.0–149.0)
VLDL: 18 mg/dL (ref 0.0–40.0)

## 2018-07-09 LAB — COMPREHENSIVE METABOLIC PANEL
ALK PHOS: 51 U/L (ref 39–117)
ALT: 34 U/L (ref 0–53)
AST: 30 U/L (ref 0–37)
Albumin: 4.7 g/dL (ref 3.5–5.2)
BILIRUBIN TOTAL: 0.6 mg/dL (ref 0.2–1.2)
BUN: 22 mg/dL (ref 6–23)
CO2: 30 meq/L (ref 19–32)
CREATININE: 1.01 mg/dL (ref 0.40–1.50)
Calcium: 9.9 mg/dL (ref 8.4–10.5)
Chloride: 97 mEq/L (ref 96–112)
GFR: 78.87 mL/min (ref >60.00)
GLUCOSE: 98 mg/dL (ref 70–99)
Potassium: 4 mEq/L (ref 3.5–5.1)
Sodium: 135 mEq/L (ref 135–145)
Total Protein: 7.6 g/dL (ref 6.0–8.3)

## 2018-07-09 LAB — PSA: PSA: 0.24 ng/mL (ref 0.10–4.00)

## 2018-07-09 NOTE — Assessment & Plan Note (Signed)
[id number] on chlorthalidone, lisinopril, metoprolol.  Seems well controlled.  Check a CMP, CBC High cholesterol: On Crestor, check a FLP CAD: Asymptomatic Insomnia: Has tried successfully Xanax and Ambien, explained patient that Xanax as a potential to be addictive so we agreed with Ambien 5 to 10 mg at nighttime.  Prescription sent. EtOH: Reports he is drinking in moderation Preventive care: Chart reviewed: Td 2011, Zostavax 2013, PNM 13 today, had a flu shot S/p colonoscopy 2018. Prostate cancer screening: Since we are doing blood work, will check a PSA RTC CPX 4 to 6 months

## 2018-08-10 DIAGNOSIS — M545 Low back pain: Secondary | ICD-10-CM | POA: Diagnosis not present

## 2018-08-10 DIAGNOSIS — M419 Scoliosis, unspecified: Secondary | ICD-10-CM | POA: Diagnosis not present

## 2018-08-10 DIAGNOSIS — I10 Essential (primary) hypertension: Secondary | ICD-10-CM | POA: Diagnosis not present

## 2018-08-10 DIAGNOSIS — M5136 Other intervertebral disc degeneration, lumbar region: Secondary | ICD-10-CM | POA: Diagnosis not present

## 2018-08-18 DIAGNOSIS — M545 Low back pain: Secondary | ICD-10-CM | POA: Diagnosis not present

## 2018-08-21 DIAGNOSIS — I10 Essential (primary) hypertension: Secondary | ICD-10-CM | POA: Diagnosis not present

## 2018-08-21 DIAGNOSIS — M5136 Other intervertebral disc degeneration, lumbar region: Secondary | ICD-10-CM | POA: Diagnosis not present

## 2018-08-21 DIAGNOSIS — M48061 Spinal stenosis, lumbar region without neurogenic claudication: Secondary | ICD-10-CM | POA: Diagnosis not present

## 2018-08-21 DIAGNOSIS — I739 Peripheral vascular disease, unspecified: Secondary | ICD-10-CM | POA: Diagnosis not present

## 2018-09-01 DIAGNOSIS — M48061 Spinal stenosis, lumbar region without neurogenic claudication: Secondary | ICD-10-CM | POA: Diagnosis not present

## 2018-11-05 ENCOUNTER — Other Ambulatory Visit: Payer: Self-pay | Admitting: Cardiology

## 2018-11-06 ENCOUNTER — Telehealth: Payer: Self-pay | Admitting: Cardiology

## 2018-11-06 NOTE — Telephone Encounter (Signed)
My Chart message sent

## 2018-11-30 ENCOUNTER — Other Ambulatory Visit: Payer: Self-pay

## 2018-11-30 MED ORDER — METOPROLOL TARTRATE 25 MG PO TABS: 25.0000 mg | ORAL_TABLET | Freq: Two times a day (BID) | ORAL | 0 refills | Status: AC

## 2018-11-30 MED ORDER — LANSOPRAZOLE 30 MG PO CPDR: 30.0000 mg | DELAYED_RELEASE_CAPSULE | Freq: Every day | ORAL | 3 refills | Status: AC

## 2018-11-30 MED ORDER — LISINOPRIL 20 MG PO TABS: 20.0000 mg | ORAL_TABLET | Freq: Two times a day (BID) | ORAL | 1 refills | Status: AC

## 2018-12-09 ENCOUNTER — Telehealth: Payer: Self-pay

## 2018-12-09 DIAGNOSIS — I469 Cardiac arrest, cause unspecified: Secondary | ICD-10-CM | POA: Diagnosis not present

## 2018-12-09 DIAGNOSIS — R404 Transient alteration of awareness: Secondary | ICD-10-CM | POA: Diagnosis not present

## 2018-12-14 ENCOUNTER — Telehealth: Payer: Self-pay

## 2018-12-14 ENCOUNTER — Telehealth: Payer: Self-pay | Admitting: Cardiology

## 2018-12-14 NOTE — Telephone Encounter (Signed)
Noted, I saw him one time few months ago, I have no details about his death

## 2018-12-14 NOTE — Telephone Encounter (Signed)
Ysidro Evert from [place] called in regards to the Death Certificate signed by Jory Sims.  According to Gerri Lins put the Cause of Death as Drug Overdose then scribbled through that, which is not allowed on the death certificate. Ysidro Evert states that can only be determined by a medical examiner.  She also checked accidental as manner of death, which needs to be corrected.  If Cardiac Arrest was the cause of death, a secondary cause must also be listed.   Ysidro Evert states that the Cremation facility will be dropping off a new death certificate. He wants to know if it could be signed ASAP. The family is becoming impatient, and the Cremation facility is not allowed to cremate the patient if the cause of death was considered accidental.   If Curt Bears needs assistance filling out the death certificate, he is more than willing to walk her through the process.

## 2018-12-14 NOTE — Telephone Encounter (Signed)
Copied from [place] [id number]. Topic: General - Deceased Patient >> December 15, 2018  1:39 PM Erick Blinks wrote: Reason for CRM: Pt reported deceased by sister.  Route to department's PEC Pool.

## 2018-12-14 NOTE — Telephone Encounter (Signed)
Sister Trena Platt called- her number is [phone] (home), or [phone] (mobile).

## 2018-12-23 DIAGNOSIS — 419620001 Death: Secondary | SNOMED CT | POA: Diagnosis not present

## 2018-12-23 NOTE — Telephone Encounter (Signed)
Incoming call from operator for DOD. EMS calling to speak with Dr. Percival Spanish and informed by operator that DOD available. Call transferred. After speaking with EMS, Dr. Gwenlyn Found advised nurse to send a message to Dr. Percival Spanish stating that pt was found dead at his home. Pt possibly dead for 1 day. Death certificate needs to be signed. Per Dr. Gwenlyn Found, Dr. Percival Spanish will be receiving a death certificate

## 2018-12-23 DEATH — deceased

## 2018-12-24 ENCOUNTER — Telehealth: Payer: BLUE CROSS/BLUE SHIELD | Admitting: Cardiology

## 2019-01-15 ENCOUNTER — Encounter: Payer: BLUE CROSS/BLUE SHIELD | Admitting: Internal Medicine

## 2019-01-18 ENCOUNTER — Other Ambulatory Visit: Payer: Self-pay | Admitting: Cardiology

## 2019-02-03 ENCOUNTER — Other Ambulatory Visit: Payer: Self-pay | Admitting: Cardiology

## 2019-02-04 ENCOUNTER — Other Ambulatory Visit: Payer: Self-pay | Admitting: Cardiology
